# Patient Record
Sex: Female | Born: 1985 | Race: Black or African American | Hispanic: Yes | Marital: Married | Smoking: Never smoker
Health system: Southern US, Community
[De-identification: ages and names within clinical notes are randomized; demographics above are authoritative.]

## PROBLEM LIST (undated history)

## (undated) DIAGNOSIS — B20 Human immunodeficiency virus [HIV] disease: Secondary | ICD-10-CM

## (undated) DIAGNOSIS — I10 Essential (primary) hypertension: Secondary | ICD-10-CM

## (undated) DIAGNOSIS — R112 Nausea with vomiting, unspecified: Secondary | ICD-10-CM

## (undated) DIAGNOSIS — R590 Localized enlarged lymph nodes: Secondary | ICD-10-CM

## (undated) DIAGNOSIS — S8990XA Unspecified injury of unspecified lower leg, initial encounter: Secondary | ICD-10-CM

## (undated) DIAGNOSIS — E119 Type 2 diabetes mellitus without complications: Secondary | ICD-10-CM

## (undated) DIAGNOSIS — Z21 Asymptomatic human immunodeficiency virus [HIV] infection status: Secondary | ICD-10-CM

## (undated) DIAGNOSIS — Z9889 Other specified postprocedural states: Secondary | ICD-10-CM

## (undated) HISTORY — PX: TUBAL LIGATION: SHX77

## (undated) HISTORY — DX: Asymptomatic human immunodeficiency virus (hiv) infection status: Z21

## (undated) HISTORY — DX: Human immunodeficiency virus (HIV) disease: B20

## (undated) HISTORY — DX: Unspecified injury of unspecified lower leg, initial encounter: S89.90XA

## (undated) HISTORY — DX: Localized enlarged lymph nodes: R59.0

---
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
## 2003-01-04 ENCOUNTER — Encounter: Payer: Self-pay | Admitting: Obstetrics and Gynecology

## 2003-01-04 ENCOUNTER — Encounter: Payer: Self-pay | Admitting: Internal Medicine

## 2003-01-04 ENCOUNTER — Inpatient Hospital Stay (HOSPITAL_COMMUNITY): Admission: AD | Admit: 2003-01-04 | Discharge: 2003-01-04 | Payer: Self-pay | Admitting: Obstetrics and Gynecology

## 2003-01-04 ENCOUNTER — Encounter: Payer: Self-pay | Admitting: Infectious Diseases

## 2003-01-04 ENCOUNTER — Encounter (INDEPENDENT_AMBULATORY_CARE_PROVIDER_SITE_OTHER): Payer: Self-pay | Admitting: *Deleted

## 2003-01-04 LAB — CONVERTED CEMR LAB: CD4 T Cell Abs: 290

## 2003-01-06 ENCOUNTER — Encounter: Admission: RE | Admit: 2003-01-06 | Discharge: 2003-01-06 | Payer: Self-pay | Admitting: *Deleted

## 2003-01-20 ENCOUNTER — Encounter: Admission: RE | Admit: 2003-01-20 | Discharge: 2003-01-20 | Payer: Self-pay | Admitting: *Deleted

## 2003-01-28 ENCOUNTER — Encounter: Admission: RE | Admit: 2003-01-28 | Discharge: 2003-01-28 | Payer: Self-pay | Admitting: Family Medicine

## 2003-01-28 ENCOUNTER — Encounter: Admission: RE | Admit: 2003-01-28 | Discharge: 2003-01-28 | Payer: Self-pay | Admitting: Infectious Diseases

## 2003-02-10 ENCOUNTER — Encounter: Admission: RE | Admit: 2003-02-10 | Discharge: 2003-02-10 | Payer: Self-pay | Admitting: Infectious Diseases

## 2003-02-11 ENCOUNTER — Encounter: Admission: RE | Admit: 2003-02-11 | Discharge: 2003-02-11 | Payer: Self-pay | Admitting: Family Medicine

## 2003-02-18 ENCOUNTER — Encounter: Admission: RE | Admit: 2003-02-18 | Discharge: 2003-02-18 | Payer: Self-pay | Admitting: Family Medicine

## 2003-02-22 ENCOUNTER — Encounter: Admission: RE | Admit: 2003-02-22 | Discharge: 2003-02-22 | Payer: Self-pay | Admitting: Infectious Diseases

## 2003-02-22 ENCOUNTER — Encounter: Payer: Self-pay | Admitting: Infectious Diseases

## 2003-03-11 ENCOUNTER — Encounter: Admission: RE | Admit: 2003-03-11 | Discharge: 2003-03-11 | Payer: Self-pay | Admitting: *Deleted

## 2003-03-18 ENCOUNTER — Encounter: Admission: RE | Admit: 2003-03-18 | Discharge: 2003-03-18 | Payer: Self-pay | Admitting: *Deleted

## 2003-03-24 ENCOUNTER — Encounter: Payer: Self-pay | Admitting: Infectious Diseases

## 2003-03-24 ENCOUNTER — Encounter: Admission: RE | Admit: 2003-03-24 | Discharge: 2003-03-24 | Payer: Self-pay | Admitting: Infectious Diseases

## 2003-03-30 ENCOUNTER — Inpatient Hospital Stay (HOSPITAL_COMMUNITY): Admission: AD | Admit: 2003-03-30 | Discharge: 2003-03-30 | Payer: Self-pay | Admitting: *Deleted

## 2003-03-30 ENCOUNTER — Inpatient Hospital Stay (HOSPITAL_COMMUNITY): Admission: AD | Admit: 2003-03-30 | Discharge: 2003-04-02 | Payer: Self-pay | Admitting: Family Medicine

## 2003-06-22 ENCOUNTER — Emergency Department (HOSPITAL_COMMUNITY): Admission: EM | Admit: 2003-06-22 | Discharge: 2003-06-22 | Payer: Self-pay | Admitting: Emergency Medicine

## 2003-09-03 ENCOUNTER — Emergency Department (HOSPITAL_COMMUNITY): Admission: EM | Admit: 2003-09-03 | Discharge: 2003-09-03 | Payer: Self-pay | Admitting: Emergency Medicine

## 2004-03-26 ENCOUNTER — Emergency Department (HOSPITAL_COMMUNITY): Admission: EM | Admit: 2004-03-26 | Discharge: 2004-03-26 | Payer: Self-pay | Admitting: *Deleted

## 2004-04-19 ENCOUNTER — Ambulatory Visit (HOSPITAL_COMMUNITY): Admission: RE | Admit: 2004-04-19 | Discharge: 2004-04-19 | Payer: Self-pay | Admitting: Obstetrics and Gynecology

## 2004-05-04 ENCOUNTER — Encounter: Admission: RE | Admit: 2004-05-04 | Discharge: 2004-05-04 | Payer: Self-pay | Admitting: Family Medicine

## 2004-05-08 ENCOUNTER — Encounter: Admission: RE | Admit: 2004-05-08 | Discharge: 2004-05-08 | Payer: Self-pay | Admitting: Infectious Diseases

## 2004-05-08 ENCOUNTER — Ambulatory Visit (HOSPITAL_COMMUNITY): Admission: RE | Admit: 2004-05-08 | Discharge: 2004-05-08 | Payer: Self-pay | Admitting: Infectious Diseases

## 2004-05-11 ENCOUNTER — Ambulatory Visit (HOSPITAL_COMMUNITY): Admission: RE | Admit: 2004-05-11 | Discharge: 2004-05-11 | Payer: Self-pay | Admitting: *Deleted

## 2004-05-18 ENCOUNTER — Encounter: Admission: RE | Admit: 2004-05-18 | Discharge: 2004-05-18 | Payer: Self-pay | Admitting: Family Medicine

## 2004-06-07 ENCOUNTER — Ambulatory Visit (HOSPITAL_COMMUNITY): Admission: RE | Admit: 2004-06-07 | Discharge: 2004-06-07 | Payer: Self-pay | Admitting: Infectious Diseases

## 2004-06-07 ENCOUNTER — Encounter: Admission: RE | Admit: 2004-06-07 | Discharge: 2004-06-07 | Payer: Self-pay | Admitting: Infectious Diseases

## 2004-06-08 ENCOUNTER — Encounter: Admission: RE | Admit: 2004-06-08 | Discharge: 2004-06-08 | Payer: Self-pay | Admitting: Family Medicine

## 2004-06-22 ENCOUNTER — Ambulatory Visit: Payer: Self-pay | Admitting: Family Medicine

## 2004-07-06 ENCOUNTER — Ambulatory Visit: Payer: Self-pay | Admitting: Family Medicine

## 2004-07-13 ENCOUNTER — Ambulatory Visit: Payer: Self-pay | Admitting: Family Medicine

## 2004-07-27 ENCOUNTER — Ambulatory Visit: Payer: Self-pay | Admitting: Family Medicine

## 2004-08-07 ENCOUNTER — Ambulatory Visit: Payer: Self-pay | Admitting: Infectious Diseases

## 2004-08-07 ENCOUNTER — Ambulatory Visit (HOSPITAL_COMMUNITY): Admission: RE | Admit: 2004-08-07 | Discharge: 2004-08-07 | Payer: Self-pay | Admitting: Infectious Diseases

## 2004-08-10 ENCOUNTER — Ambulatory Visit: Payer: Self-pay | Admitting: Family Medicine

## 2004-08-24 ENCOUNTER — Ambulatory Visit: Payer: Self-pay | Admitting: Family Medicine

## 2004-09-05 ENCOUNTER — Ambulatory Visit: Payer: Self-pay | Admitting: Infectious Diseases

## 2004-09-05 ENCOUNTER — Ambulatory Visit (HOSPITAL_COMMUNITY): Admission: RE | Admit: 2004-09-05 | Discharge: 2004-09-05 | Payer: Self-pay | Admitting: Infectious Diseases

## 2004-09-06 ENCOUNTER — Ambulatory Visit: Payer: Self-pay | Admitting: Obstetrics & Gynecology

## 2004-09-13 ENCOUNTER — Ambulatory Visit (HOSPITAL_COMMUNITY): Admission: RE | Admit: 2004-09-13 | Discharge: 2004-09-13 | Payer: Self-pay | Admitting: Infectious Diseases

## 2004-09-13 ENCOUNTER — Ambulatory Visit: Payer: Self-pay | Admitting: Family Medicine

## 2004-09-14 ENCOUNTER — Ambulatory Visit: Payer: Self-pay | Admitting: Family Medicine

## 2004-09-14 ENCOUNTER — Inpatient Hospital Stay (HOSPITAL_COMMUNITY): Admission: AD | Admit: 2004-09-14 | Discharge: 2004-09-14 | Payer: Self-pay | Admitting: Obstetrics & Gynecology

## 2004-09-20 ENCOUNTER — Ambulatory Visit: Payer: Self-pay | Admitting: *Deleted

## 2004-09-27 ENCOUNTER — Ambulatory Visit: Payer: Self-pay | Admitting: Infectious Diseases

## 2004-09-27 ENCOUNTER — Ambulatory Visit (HOSPITAL_COMMUNITY): Admission: RE | Admit: 2004-09-27 | Discharge: 2004-09-27 | Payer: Self-pay | Admitting: Infectious Diseases

## 2004-09-28 ENCOUNTER — Ambulatory Visit: Payer: Self-pay | Admitting: Family Medicine

## 2004-10-05 ENCOUNTER — Ambulatory Visit: Payer: Self-pay | Admitting: *Deleted

## 2004-10-06 ENCOUNTER — Inpatient Hospital Stay (HOSPITAL_COMMUNITY): Admission: RE | Admit: 2004-10-06 | Discharge: 2004-10-09 | Payer: Self-pay | Admitting: Obstetrics and Gynecology

## 2004-10-06 ENCOUNTER — Encounter (INDEPENDENT_AMBULATORY_CARE_PROVIDER_SITE_OTHER): Payer: Self-pay | Admitting: *Deleted

## 2005-03-20 ENCOUNTER — Emergency Department (HOSPITAL_COMMUNITY): Admission: EM | Admit: 2005-03-20 | Discharge: 2005-03-21 | Payer: Self-pay | Admitting: Emergency Medicine

## 2005-05-11 ENCOUNTER — Ambulatory Visit: Payer: Self-pay | Admitting: Infectious Diseases

## 2005-05-11 ENCOUNTER — Ambulatory Visit (HOSPITAL_COMMUNITY): Admission: RE | Admit: 2005-05-11 | Discharge: 2005-05-11 | Payer: Self-pay | Admitting: Infectious Diseases

## 2005-05-11 ENCOUNTER — Encounter (INDEPENDENT_AMBULATORY_CARE_PROVIDER_SITE_OTHER): Payer: Self-pay | Admitting: *Deleted

## 2005-07-02 ENCOUNTER — Emergency Department (HOSPITAL_COMMUNITY): Admission: EM | Admit: 2005-07-02 | Discharge: 2005-07-02 | Payer: Self-pay | Admitting: Emergency Medicine

## 2005-07-04 ENCOUNTER — Ambulatory Visit (HOSPITAL_COMMUNITY): Admission: RE | Admit: 2005-07-04 | Discharge: 2005-07-04 | Payer: Self-pay | Admitting: Infectious Diseases

## 2005-07-04 ENCOUNTER — Ambulatory Visit: Payer: Self-pay | Admitting: Infectious Diseases

## 2005-10-03 ENCOUNTER — Ambulatory Visit: Payer: Self-pay | Admitting: Infectious Diseases

## 2005-10-03 ENCOUNTER — Encounter (INDEPENDENT_AMBULATORY_CARE_PROVIDER_SITE_OTHER): Payer: Self-pay | Admitting: *Deleted

## 2005-10-03 ENCOUNTER — Ambulatory Visit (HOSPITAL_COMMUNITY): Admission: RE | Admit: 2005-10-03 | Discharge: 2005-10-03 | Payer: Self-pay | Admitting: Infectious Diseases

## 2005-10-03 LAB — CONVERTED CEMR LAB: HIV 1 RNA Quant: 3810 copies/mL

## 2005-11-02 ENCOUNTER — Emergency Department (HOSPITAL_COMMUNITY): Admission: EM | Admit: 2005-11-02 | Discharge: 2005-11-02 | Payer: Self-pay | Admitting: Emergency Medicine

## 2006-03-29 ENCOUNTER — Emergency Department (HOSPITAL_COMMUNITY): Admission: EM | Admit: 2006-03-29 | Discharge: 2006-03-29 | Payer: Self-pay | Admitting: Emergency Medicine

## 2006-04-11 ENCOUNTER — Emergency Department (HOSPITAL_COMMUNITY): Admission: EM | Admit: 2006-04-11 | Discharge: 2006-04-11 | Payer: Self-pay | Admitting: Emergency Medicine

## 2006-04-16 ENCOUNTER — Emergency Department (HOSPITAL_COMMUNITY): Admission: EM | Admit: 2006-04-16 | Discharge: 2006-04-16 | Payer: Self-pay | Admitting: Emergency Medicine

## 2006-07-12 ENCOUNTER — Emergency Department (HOSPITAL_COMMUNITY): Admission: EM | Admit: 2006-07-12 | Discharge: 2006-07-12 | Payer: Self-pay | Admitting: Emergency Medicine

## 2006-08-13 ENCOUNTER — Emergency Department (HOSPITAL_COMMUNITY): Admission: EM | Admit: 2006-08-13 | Discharge: 2006-08-13 | Payer: Self-pay | Admitting: Emergency Medicine

## 2006-10-15 HISTORY — PX: BARTHOLIN GLAND CYST EXCISION: SHX565

## 2006-10-30 DIAGNOSIS — B20 Human immunodeficiency virus [HIV] disease: Secondary | ICD-10-CM | POA: Insufficient documentation

## 2006-11-04 ENCOUNTER — Ambulatory Visit (HOSPITAL_COMMUNITY): Admission: AD | Admit: 2006-11-04 | Discharge: 2006-11-04 | Payer: Self-pay | Admitting: Obstetrics & Gynecology

## 2006-11-04 ENCOUNTER — Ambulatory Visit: Payer: Self-pay | Admitting: Gynecology

## 2006-11-05 ENCOUNTER — Encounter: Admission: RE | Admit: 2006-11-05 | Discharge: 2006-11-05 | Payer: Self-pay | Admitting: Infectious Diseases

## 2006-11-05 ENCOUNTER — Ambulatory Visit: Payer: Self-pay | Admitting: Infectious Diseases

## 2006-11-05 ENCOUNTER — Encounter: Payer: Self-pay | Admitting: Internal Medicine

## 2006-11-05 ENCOUNTER — Encounter (INDEPENDENT_AMBULATORY_CARE_PROVIDER_SITE_OTHER): Payer: Self-pay | Admitting: *Deleted

## 2006-11-05 LAB — CONVERTED CEMR LAB
BUN: 9 mg/dL (ref 6–23)
Basophils Relative: 0 % (ref 0–1)
CD4 Count: 220 microliters
CO2: 25 meq/L (ref 19–32)
Calcium: 8.6 mg/dL (ref 8.4–10.5)
Eosinophils Relative: 3 % (ref 0–5)
Glucose, Bld: 82 mg/dL (ref 70–99)
HCT: 37.2 % (ref 36.0–46.0)
HIV 1 RNA Quant: 1600 copies/mL
Lymphocytes Relative: 31 % (ref 12–46)
Lymphs Abs: 1 10*3/uL (ref 0.7–3.3)
MCV: 89.6 fL (ref 78.0–100.0)
Monocytes Absolute: 0.3 10*3/uL (ref 0.2–0.7)
Monocytes Relative: 9 % (ref 3–11)
Neutro Abs: 1.8 10*3/uL (ref 1.7–7.7)
Potassium: 4.1 meq/L (ref 3.5–5.3)
RBC: 4.15 M/uL (ref 3.87–5.11)
Sodium: 134 meq/L — ABNORMAL LOW (ref 135–145)
Total Protein: 9.9 g/dL — ABNORMAL HIGH (ref 6.0–8.3)
WBC: 3.2 10*3/uL — ABNORMAL LOW (ref 4.0–10.5)

## 2006-11-06 DIAGNOSIS — F329 Major depressive disorder, single episode, unspecified: Secondary | ICD-10-CM

## 2006-11-06 DIAGNOSIS — Z9189 Other specified personal risk factors, not elsewhere classified: Secondary | ICD-10-CM | POA: Insufficient documentation

## 2006-11-06 DIAGNOSIS — Z9119 Patient's noncompliance with other medical treatment and regimen: Secondary | ICD-10-CM

## 2006-11-06 DIAGNOSIS — Z91199 Patient's noncompliance with other medical treatment and regimen due to unspecified reason: Secondary | ICD-10-CM | POA: Insufficient documentation

## 2006-11-07 ENCOUNTER — Encounter: Payer: Self-pay | Admitting: Infectious Diseases

## 2006-11-20 ENCOUNTER — Ambulatory Visit: Payer: Self-pay | Admitting: Infectious Diseases

## 2006-11-20 ENCOUNTER — Encounter: Admission: RE | Admit: 2006-11-20 | Discharge: 2006-11-20 | Payer: Self-pay | Admitting: Infectious Diseases

## 2006-11-20 ENCOUNTER — Encounter (INDEPENDENT_AMBULATORY_CARE_PROVIDER_SITE_OTHER): Payer: Self-pay | Admitting: *Deleted

## 2006-11-20 LAB — CONVERTED CEMR LAB
CD4 Count: 320 microliters
HIV 1 RNA Quant: 49 copies/mL

## 2006-11-28 ENCOUNTER — Ambulatory Visit: Payer: Self-pay | Admitting: Obstetrics and Gynecology

## 2006-12-09 ENCOUNTER — Encounter (INDEPENDENT_AMBULATORY_CARE_PROVIDER_SITE_OTHER): Payer: Self-pay | Admitting: *Deleted

## 2006-12-09 LAB — CONVERTED CEMR LAB: Pap Smear: NORMAL

## 2006-12-22 ENCOUNTER — Encounter (INDEPENDENT_AMBULATORY_CARE_PROVIDER_SITE_OTHER): Payer: Self-pay | Admitting: *Deleted

## 2006-12-26 ENCOUNTER — Telehealth (INDEPENDENT_AMBULATORY_CARE_PROVIDER_SITE_OTHER): Payer: Self-pay | Admitting: Infectious Diseases

## 2006-12-30 ENCOUNTER — Telehealth (INDEPENDENT_AMBULATORY_CARE_PROVIDER_SITE_OTHER): Payer: Self-pay | Admitting: Infectious Diseases

## 2007-02-07 ENCOUNTER — Emergency Department (HOSPITAL_COMMUNITY): Admission: EM | Admit: 2007-02-07 | Discharge: 2007-02-07 | Payer: Self-pay | Admitting: Family Medicine

## 2007-05-07 ENCOUNTER — Encounter (INDEPENDENT_AMBULATORY_CARE_PROVIDER_SITE_OTHER): Payer: Self-pay | Admitting: *Deleted

## 2007-08-27 ENCOUNTER — Encounter (INDEPENDENT_AMBULATORY_CARE_PROVIDER_SITE_OTHER): Payer: Self-pay | Admitting: *Deleted

## 2007-12-19 ENCOUNTER — Inpatient Hospital Stay (HOSPITAL_COMMUNITY): Admission: AD | Admit: 2007-12-19 | Discharge: 2007-12-19 | Payer: Self-pay | Admitting: Family Medicine

## 2007-12-19 ENCOUNTER — Telehealth: Payer: Self-pay | Admitting: Infectious Diseases

## 2008-01-20 ENCOUNTER — Encounter: Payer: Self-pay | Admitting: Infectious Diseases

## 2008-02-11 ENCOUNTER — Emergency Department (HOSPITAL_COMMUNITY): Admission: EM | Admit: 2008-02-11 | Discharge: 2008-02-11 | Payer: Self-pay | Admitting: Emergency Medicine

## 2008-02-15 ENCOUNTER — Emergency Department (HOSPITAL_COMMUNITY): Admission: EM | Admit: 2008-02-15 | Discharge: 2008-02-15 | Payer: Self-pay | Admitting: Emergency Medicine

## 2008-04-21 ENCOUNTER — Emergency Department (HOSPITAL_COMMUNITY): Admission: EM | Admit: 2008-04-21 | Discharge: 2008-04-21 | Payer: Self-pay | Admitting: Emergency Medicine

## 2008-04-22 ENCOUNTER — Emergency Department (HOSPITAL_COMMUNITY): Admission: EM | Admit: 2008-04-22 | Discharge: 2008-04-23 | Payer: Self-pay | Admitting: Emergency Medicine

## 2008-11-24 ENCOUNTER — Encounter: Payer: Self-pay | Admitting: Infectious Diseases

## 2008-11-29 ENCOUNTER — Emergency Department (HOSPITAL_COMMUNITY): Admission: EM | Admit: 2008-11-29 | Discharge: 2008-11-29 | Payer: Self-pay | Admitting: Emergency Medicine

## 2008-12-30 ENCOUNTER — Emergency Department (HOSPITAL_COMMUNITY): Admission: EM | Admit: 2008-12-30 | Discharge: 2008-12-30 | Payer: Self-pay | Admitting: Emergency Medicine

## 2009-01-07 ENCOUNTER — Ambulatory Visit: Payer: Self-pay | Admitting: Internal Medicine

## 2009-01-07 ENCOUNTER — Encounter: Payer: Self-pay | Admitting: Infectious Diseases

## 2009-01-07 ENCOUNTER — Encounter: Payer: Self-pay | Admitting: Internal Medicine

## 2009-01-07 LAB — CONVERTED CEMR LAB
HIV 1 RNA Quant: 10500 copies/mL — ABNORMAL HIGH (ref <48)
HIV-1 RNA Quant, Log: 4.02 — ABNORMAL HIGH (ref <1.68)

## 2009-01-10 LAB — CONVERTED CEMR LAB
AST: 21 units/L (ref 0–37)
BUN: 9 mg/dL (ref 6–23)
Basophils Absolute: 0 10*3/uL (ref 0.0–0.1)
Basophils Relative: 0 % (ref 0–1)
CO2: 20 meq/L (ref 19–32)
Chloride: 103 meq/L (ref 96–112)
Creatinine, Ser: 0.65 mg/dL (ref 0.40–1.20)
GFR calc Af Amer: >60 mL/min (ref >60)
GFR calc non Af Amer: >60 mL/min (ref >60)
Glucose, Bld: 93 mg/dL (ref 70–99)
Lymphocytes Relative: 39 % (ref 12–46)
Lymphs Abs: 1 10*3/uL (ref 0.7–4.0)
MCHC: 35.1 g/dL (ref 30.0–36.0)
Monocytes Absolute: 0.2 10*3/uL (ref 0.1–1.0)
Monocytes Relative: 9 % (ref 3–12)
Neutro Abs: 1.3 10*3/uL — ABNORMAL LOW (ref 1.7–7.7)
Platelets: 245 10*3/uL (ref 150–400)
RDW: 14.9 % (ref 11.5–15.5)
Sodium: 131 meq/L — ABNORMAL LOW (ref 135–145)
WBC: 2.5 10*3/uL — ABNORMAL LOW (ref 4.0–10.5)

## 2009-01-12 ENCOUNTER — Encounter: Payer: Self-pay | Admitting: Infectious Diseases

## 2009-01-12 ENCOUNTER — Telehealth (INDEPENDENT_AMBULATORY_CARE_PROVIDER_SITE_OTHER): Payer: Self-pay | Admitting: Licensed Clinical Social Worker

## 2009-01-26 ENCOUNTER — Encounter (INDEPENDENT_AMBULATORY_CARE_PROVIDER_SITE_OTHER): Payer: Self-pay | Admitting: *Deleted

## 2009-01-26 ENCOUNTER — Ambulatory Visit: Payer: Self-pay | Admitting: Internal Medicine

## 2009-01-26 DIAGNOSIS — Z862 Personal history of diseases of the blood and blood-forming organs and certain disorders involving the immune mechanism: Secondary | ICD-10-CM

## 2009-01-26 DIAGNOSIS — Z8639 Personal history of other endocrine, nutritional and metabolic disease: Secondary | ICD-10-CM | POA: Insufficient documentation

## 2009-01-26 LAB — CONVERTED CEMR LAB
Chloride: 101 meq/L (ref 96–112)
GFR calc Af Amer: >60 mL/min (ref >60)
Potassium: 3.8 meq/L (ref 3.5–5.3)

## 2009-02-02 ENCOUNTER — Telehealth (INDEPENDENT_AMBULATORY_CARE_PROVIDER_SITE_OTHER): Payer: Self-pay | Admitting: *Deleted

## 2009-02-02 ENCOUNTER — Ambulatory Visit: Payer: Self-pay | Admitting: Internal Medicine

## 2009-02-02 DIAGNOSIS — K6289 Other specified diseases of anus and rectum: Secondary | ICD-10-CM

## 2009-02-03 ENCOUNTER — Telehealth (INDEPENDENT_AMBULATORY_CARE_PROVIDER_SITE_OTHER): Payer: Self-pay | Admitting: *Deleted

## 2009-02-03 ENCOUNTER — Telehealth: Payer: Self-pay | Admitting: Internal Medicine

## 2009-02-04 ENCOUNTER — Telehealth (INDEPENDENT_AMBULATORY_CARE_PROVIDER_SITE_OTHER): Payer: Self-pay | Admitting: Licensed Clinical Social Worker

## 2009-03-11 ENCOUNTER — Ambulatory Visit: Payer: Self-pay | Admitting: Internal Medicine

## 2009-03-11 LAB — CONVERTED CEMR LAB
AST: 25 units/L (ref 0–37)
BUN: 10 mg/dL (ref 6–23)
Calcium: 8.1 mg/dL — ABNORMAL LOW (ref 8.4–10.5)
Chloride: 105 meq/L (ref 96–112)
Eosinophils Absolute: 0 10*3/uL (ref 0.0–0.7)
GFR calc non Af Amer: >60 mL/min (ref >60)
Glucose, Bld: 90 mg/dL (ref 70–99)
HCT: 34.7 % — ABNORMAL LOW (ref 36.0–46.0)
Hemoglobin: 11.5 g/dL — ABNORMAL LOW (ref 12.0–15.0)
Lymphocytes Relative: 42 % (ref 12–46)
MCHC: 33.1 g/dL (ref 30.0–36.0)
MCV: 84.2 fL (ref 78.0–100.0)
Monocytes Absolute: 0.1 10*3/uL (ref 0.1–1.0)
Neutro Abs: 1.1 10*3/uL — ABNORMAL LOW (ref 1.7–7.7)
Platelets: 240 10*3/uL (ref 150–400)
Total Bilirubin: 0.4 mg/dL (ref 0.3–1.2)
Total Protein: 10 g/dL — ABNORMAL HIGH (ref 6.0–8.3)

## 2009-04-08 ENCOUNTER — Emergency Department (HOSPITAL_COMMUNITY): Admission: EM | Admit: 2009-04-08 | Discharge: 2009-04-08 | Payer: Self-pay | Admitting: Emergency Medicine

## 2009-04-27 ENCOUNTER — Ambulatory Visit: Payer: Self-pay | Admitting: Internal Medicine

## 2009-05-02 ENCOUNTER — Encounter (INDEPENDENT_AMBULATORY_CARE_PROVIDER_SITE_OTHER): Payer: Self-pay | Admitting: *Deleted

## 2009-08-29 ENCOUNTER — Emergency Department (HOSPITAL_COMMUNITY): Admission: EM | Admit: 2009-08-29 | Discharge: 2009-08-29 | Payer: Self-pay | Admitting: Emergency Medicine

## 2009-08-31 ENCOUNTER — Emergency Department (HOSPITAL_COMMUNITY): Admission: EM | Admit: 2009-08-31 | Discharge: 2009-08-31 | Payer: Self-pay | Admitting: Emergency Medicine

## 2009-09-05 ENCOUNTER — Encounter: Payer: Self-pay | Admitting: Infectious Diseases

## 2009-09-06 ENCOUNTER — Ambulatory Visit: Payer: Self-pay | Admitting: Internal Medicine

## 2009-09-22 ENCOUNTER — Encounter: Payer: Self-pay | Admitting: Infectious Diseases

## 2009-10-15 HISTORY — PX: SKIN GRAFT: SHX250

## 2009-10-20 ENCOUNTER — Ambulatory Visit: Payer: Self-pay | Admitting: Internal Medicine

## 2009-10-20 LAB — CONVERTED CEMR LAB
Albumin: 3 g/dL — ABNORMAL LOW (ref 3.5–5.2)
Alkaline Phosphatase: 56 units/L (ref 39–117)
BUN: 9 mg/dL (ref 6–23)
Basophils Absolute: 0 10*3/uL (ref 0.0–0.1)
CO2: 19 meq/L (ref 19–32)
Creatinine, Ser: 0.65 mg/dL (ref 0.40–1.20)
Glucose, Bld: 91 mg/dL (ref 70–99)
HCT: 35.2 % — ABNORMAL LOW (ref 36.0–46.0)
Hemoglobin: 12.3 g/dL (ref 12.0–15.0)
MCHC: 34.9 g/dL (ref 30.0–36.0)
Monocytes Absolute: 0.1 10*3/uL (ref 0.1–1.0)
Monocytes Relative: 5 % (ref 3–12)
Neutro Abs: 1.2 10*3/uL — ABNORMAL LOW (ref 1.7–7.7)
Platelets: 271 10*3/uL (ref 150–400)
Potassium: 4 meq/L (ref 3.5–5.3)
RDW: 14.4 % (ref 11.5–15.5)
Total Bilirubin: 0.5 mg/dL (ref 0.3–1.2)
Total Protein: 10.6 g/dL — ABNORMAL HIGH (ref 6.0–8.3)

## 2009-10-30 ENCOUNTER — Ambulatory Visit: Payer: Self-pay | Admitting: Internal Medicine

## 2009-10-30 ENCOUNTER — Inpatient Hospital Stay (HOSPITAL_COMMUNITY): Admission: EM | Admit: 2009-10-30 | Discharge: 2009-11-01 | Payer: Self-pay | Admitting: Emergency Medicine

## 2009-10-30 ENCOUNTER — Encounter: Payer: Self-pay | Admitting: Internal Medicine

## 2009-11-01 ENCOUNTER — Encounter: Payer: Self-pay | Admitting: Internal Medicine

## 2009-11-04 ENCOUNTER — Ambulatory Visit: Payer: Self-pay | Admitting: Internal Medicine

## 2009-11-04 DIAGNOSIS — R1110 Vomiting, unspecified: Secondary | ICD-10-CM

## 2009-11-14 ENCOUNTER — Ambulatory Visit: Payer: Self-pay | Admitting: Internal Medicine

## 2009-11-14 ENCOUNTER — Other Ambulatory Visit: Admission: RE | Admit: 2009-11-14 | Discharge: 2009-11-14 | Payer: Self-pay | Admitting: Internal Medicine

## 2009-11-14 LAB — CONVERTED CEMR LAB
Albumin: 3.3 g/dL — ABNORMAL LOW (ref 3.5–5.2)
Alkaline Phosphatase: 65 units/L (ref 39–117)
Pap Smear: NEGATIVE
Total Protein: 10.4 g/dL — ABNORMAL HIGH (ref 6.0–8.3)

## 2009-11-15 ENCOUNTER — Ambulatory Visit (HOSPITAL_COMMUNITY): Admission: RE | Admit: 2009-11-15 | Discharge: 2009-11-15 | Payer: Self-pay | Admitting: Internal Medicine

## 2009-11-15 ENCOUNTER — Ambulatory Visit: Payer: Self-pay | Admitting: Internal Medicine

## 2009-11-15 DIAGNOSIS — T148XXA Other injury of unspecified body region, initial encounter: Secondary | ICD-10-CM | POA: Insufficient documentation

## 2009-11-16 ENCOUNTER — Telehealth: Payer: Self-pay | Admitting: *Deleted

## 2009-11-30 ENCOUNTER — Encounter (INDEPENDENT_AMBULATORY_CARE_PROVIDER_SITE_OTHER): Payer: Self-pay | Admitting: Licensed Clinical Social Worker

## 2009-11-30 DIAGNOSIS — L02419 Cutaneous abscess of limb, unspecified: Secondary | ICD-10-CM

## 2009-11-30 DIAGNOSIS — M79609 Pain in unspecified limb: Secondary | ICD-10-CM

## 2009-11-30 DIAGNOSIS — L03119 Cellulitis of unspecified part of limb: Secondary | ICD-10-CM | POA: Insufficient documentation

## 2009-12-01 ENCOUNTER — Ambulatory Visit (HOSPITAL_COMMUNITY): Admission: RE | Admit: 2009-12-01 | Discharge: 2009-12-01 | Payer: Self-pay | Admitting: Internal Medicine

## 2009-12-03 ENCOUNTER — Encounter: Payer: Self-pay | Admitting: Internal Medicine

## 2009-12-09 ENCOUNTER — Emergency Department (HOSPITAL_COMMUNITY): Admission: EM | Admit: 2009-12-09 | Discharge: 2009-12-09 | Payer: Self-pay | Admitting: Family Medicine

## 2009-12-09 ENCOUNTER — Emergency Department (HOSPITAL_COMMUNITY): Admission: EM | Admit: 2009-12-09 | Discharge: 2009-12-10 | Payer: Self-pay | Admitting: Emergency Medicine

## 2009-12-10 ENCOUNTER — Telehealth: Payer: Self-pay | Admitting: Internal Medicine

## 2009-12-14 ENCOUNTER — Ambulatory Visit: Payer: Self-pay | Admitting: Internal Medicine

## 2009-12-14 LAB — CONVERTED CEMR LAB: HIV 1 RNA Quant: 3750 copies/mL — ABNORMAL HIGH (ref <48)

## 2009-12-15 ENCOUNTER — Encounter: Payer: Self-pay | Admitting: Internal Medicine

## 2009-12-15 LAB — CONVERTED CEMR LAB
Albumin: 3.5 g/dL (ref 3.5–5.2)
BUN: 9 mg/dL (ref 6–23)
Basophils Absolute: 0 10*3/uL (ref 0.0–0.1)
Chloride: 102 meq/L (ref 96–112)
MCHC: 32.1 g/dL (ref 30.0–36.0)
MCV: 86.4 fL (ref >=78.0)
Monocytes Absolute: 0.2 10*3/uL (ref 0.1–1.0)
Monocytes Relative: 9 % (ref 3–12)
Neutrophils Relative %: 54 % (ref 43–77)
RDW: 14.5 % (ref 11.5–15.5)
Sodium: 131 meq/L — ABNORMAL LOW (ref 135–145)
Total Bilirubin: 0.3 mg/dL (ref 0.3–1.2)
WBC: 2 10*3/uL — ABNORMAL LOW (ref 4.0–10.5)

## 2009-12-19 ENCOUNTER — Encounter (HOSPITAL_BASED_OUTPATIENT_CLINIC_OR_DEPARTMENT_OTHER): Admission: RE | Admit: 2009-12-19 | Discharge: 2010-03-19 | Payer: Self-pay | Admitting: Internal Medicine

## 2009-12-20 ENCOUNTER — Encounter (INDEPENDENT_AMBULATORY_CARE_PROVIDER_SITE_OTHER): Payer: Self-pay | Admitting: *Deleted

## 2009-12-30 ENCOUNTER — Ambulatory Visit: Payer: Self-pay | Admitting: Vascular Surgery

## 2010-01-06 ENCOUNTER — Ambulatory Visit: Payer: Self-pay | Admitting: Internal Medicine

## 2010-01-06 DIAGNOSIS — J209 Acute bronchitis, unspecified: Secondary | ICD-10-CM | POA: Insufficient documentation

## 2010-01-06 LAB — CONVERTED CEMR LAB
HIV 1 RNA Quant: 9820 copies/mL — ABNORMAL HIGH (ref <48)
HIV-1 RNA Quant, Log: 3.99 — ABNORMAL HIGH (ref <1.68)

## 2010-01-31 ENCOUNTER — Ambulatory Visit: Payer: Self-pay | Admitting: Vascular Surgery

## 2010-01-31 ENCOUNTER — Ambulatory Visit: Admission: RE | Admit: 2010-01-31 | Discharge: 2010-01-31 | Payer: Self-pay | Admitting: Internal Medicine

## 2010-01-31 ENCOUNTER — Encounter (HOSPITAL_BASED_OUTPATIENT_CLINIC_OR_DEPARTMENT_OTHER): Payer: Self-pay | Admitting: Internal Medicine

## 2010-02-01 ENCOUNTER — Ambulatory Visit: Payer: Self-pay | Admitting: Internal Medicine

## 2010-02-13 IMAGING — CR DG TIBIA/FIBULA 2V*L*
4 series · 4 of 4 positions shown · non-contrast
Comparison: None

CLINICAL DATA: [DATE], fracture 12 years ago, now with open wound
laterally, question osteomyelitis

LEFT TIBIA AND FIBULA - 2 VIEW

[t tib/fib ap left (1 of 2)]
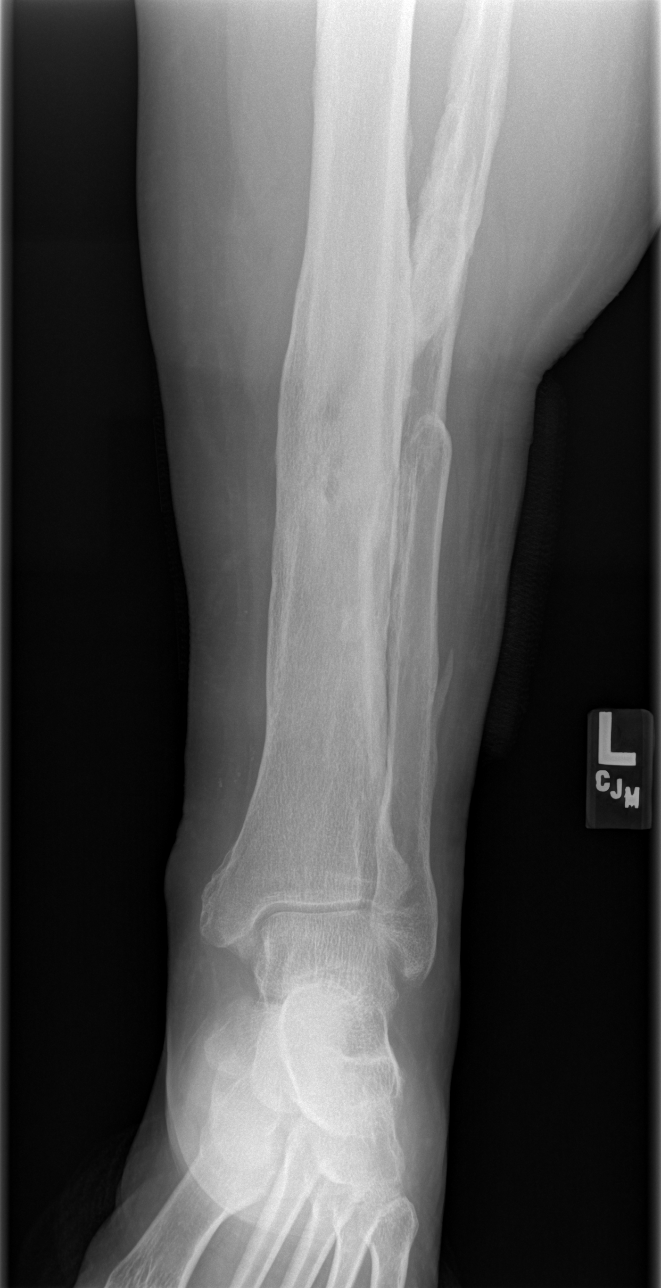

[t tib/fib ap left (2 of 2)]
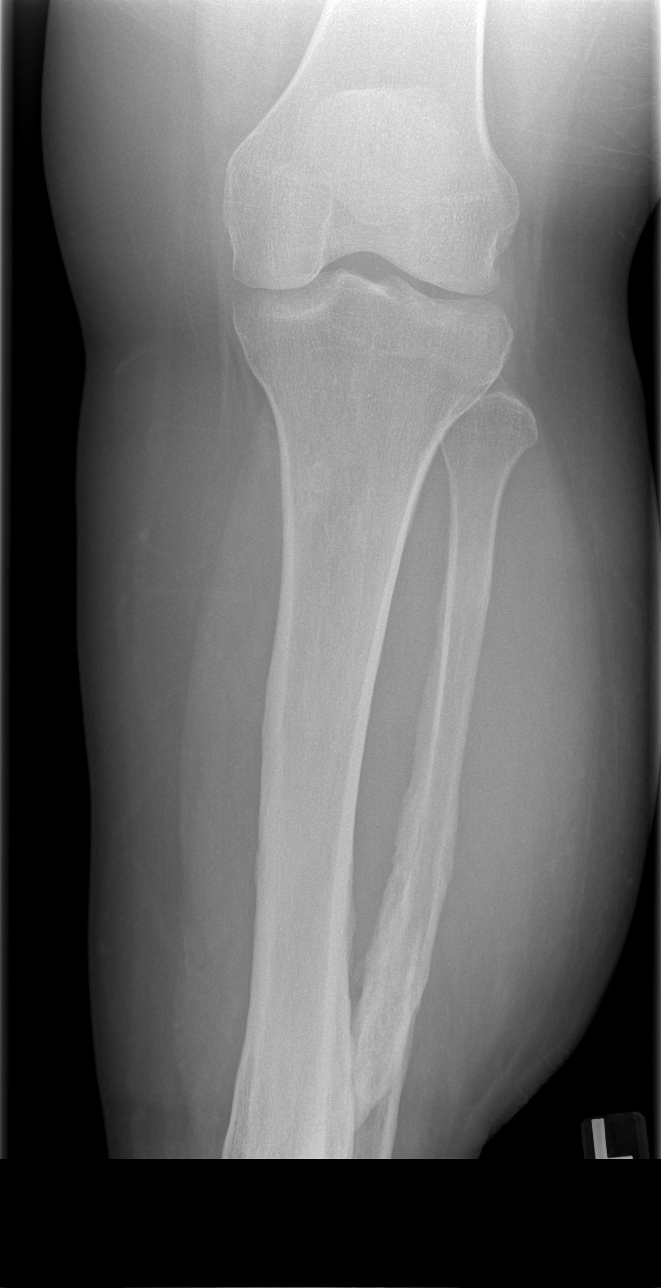

[t tib/fib lat left (1 of 2)]
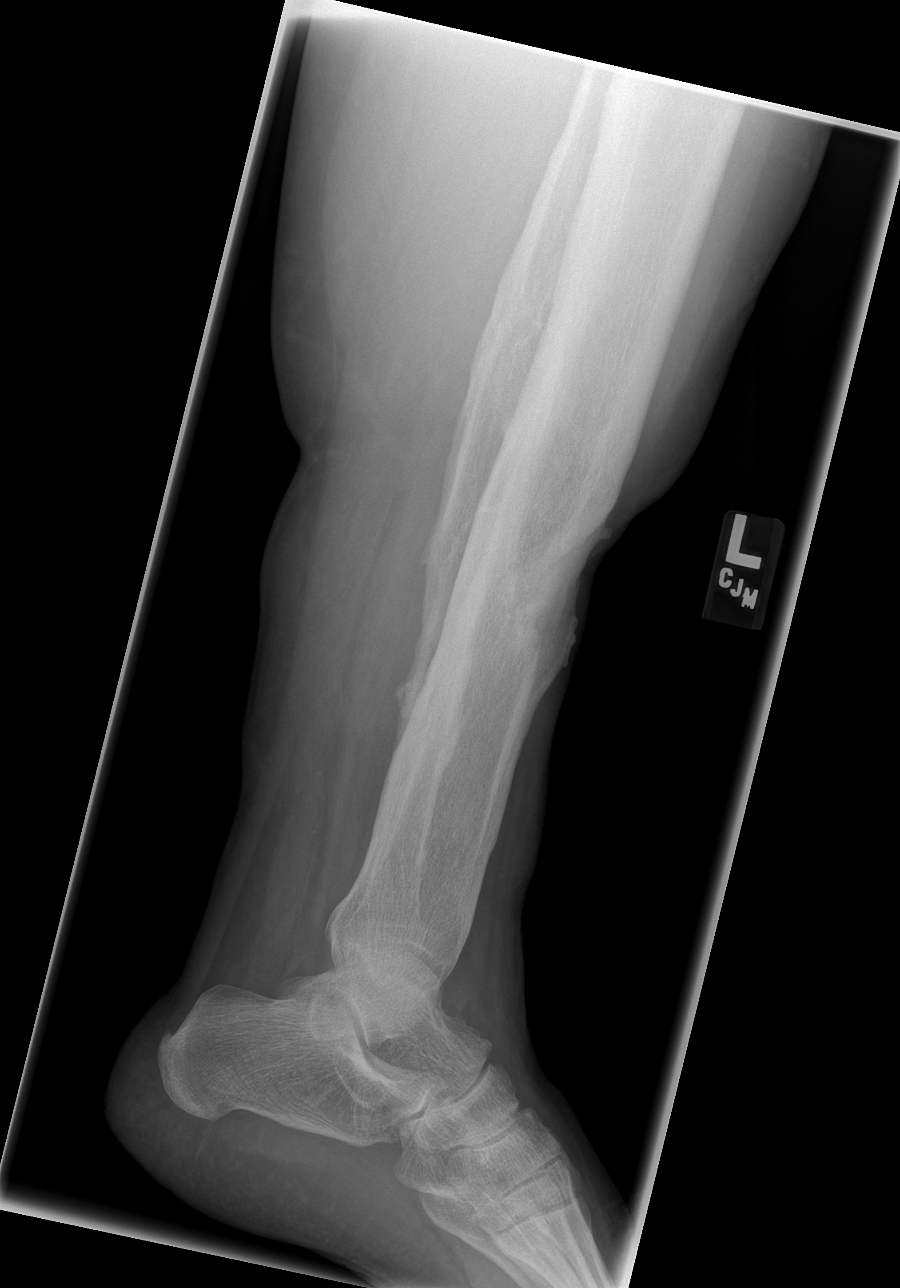

[t tib/fib lat left (2 of 2)]
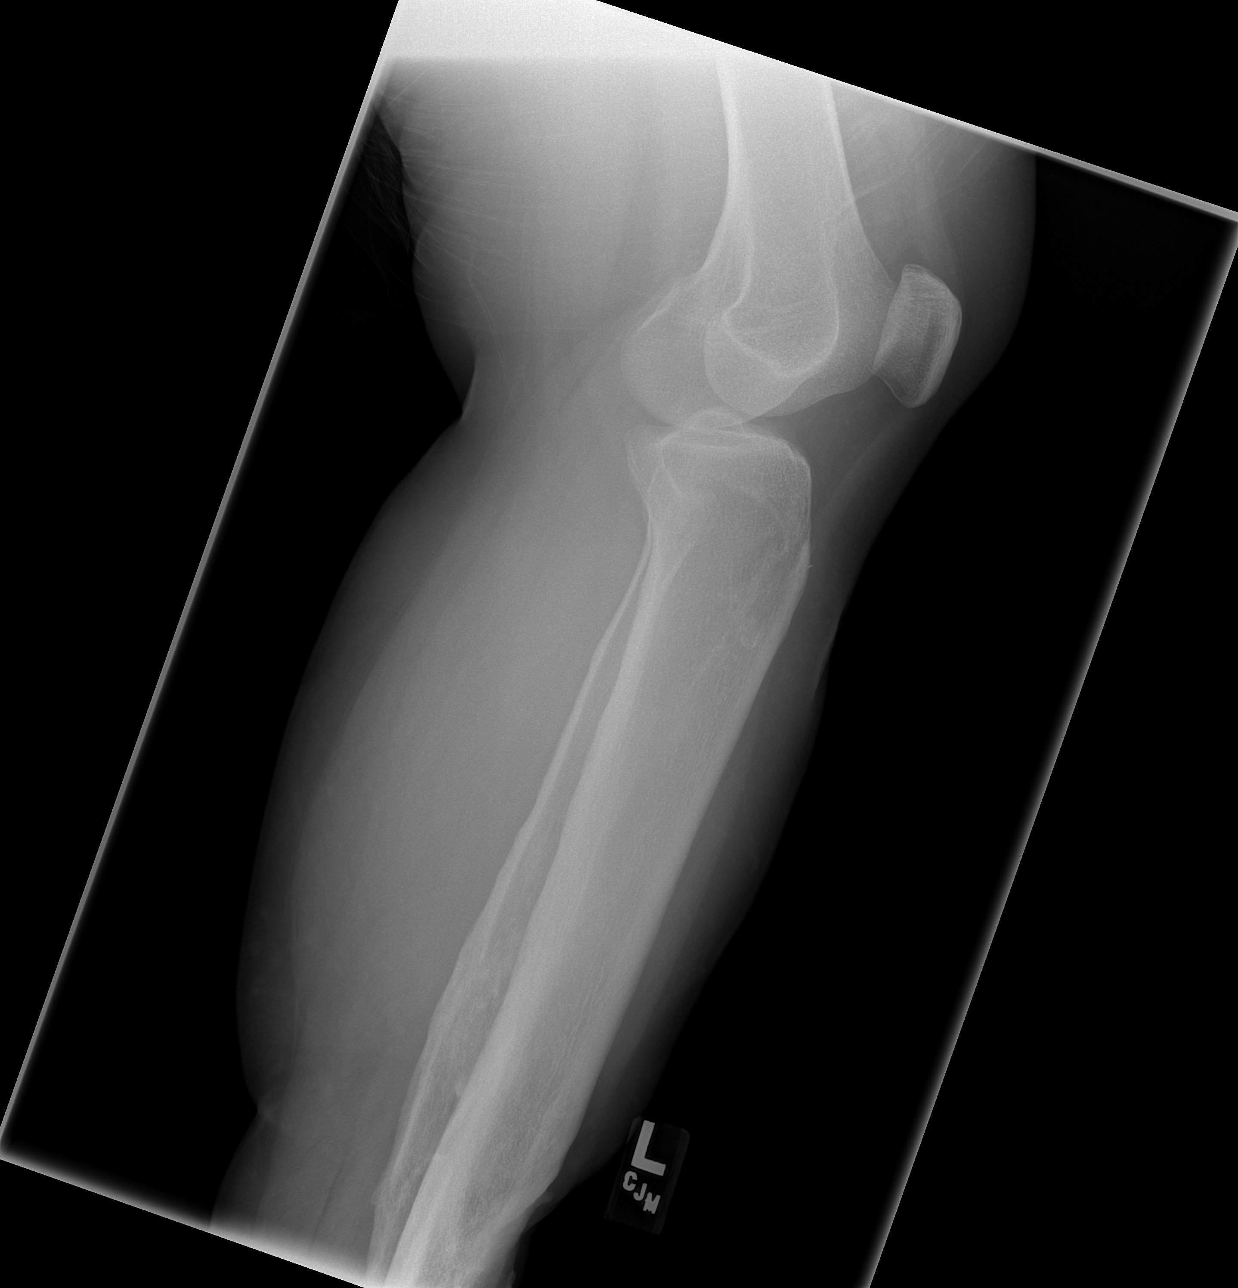

[4 of 4 positions shown; findings below may reference images not displayed]

FINDINGS: Diffuse bony demineralization.
Ankle and knee joint alignments normal.
Deformities of the middle thirds of the left fibula and tibia
identified.
Bony deformity and well-formed periosteal bone at anterior mid
tibia.
Mature periosteal new bone identified along the shaft of left
fibula.
No definite acute periosteal reaction or bone destruction seen to
suggest acute osteomyelitis.
Pretibial soft tissue swelling at proximal half of left lower leg.
No acute fracture or dislocation.
IMPRESSION: Bony deformities of left tibial and fibular diaphyses compatible
with old fractures.
No definite acute bony abnormalities identified to suggest acute
osteomyelitis.
If there remains clinical concern for osteomyelitis, or for soft
tissue abscess of the left lower leg, recommend MR imaging with
without contrast.

## 2010-02-15 ENCOUNTER — Ambulatory Visit: Payer: Self-pay | Admitting: Internal Medicine

## 2010-02-15 ENCOUNTER — Telehealth (INDEPENDENT_AMBULATORY_CARE_PROVIDER_SITE_OTHER): Payer: Self-pay | Admitting: *Deleted

## 2010-02-15 DIAGNOSIS — B373 Candidiasis of vulva and vagina: Secondary | ICD-10-CM | POA: Insufficient documentation

## 2010-02-15 DIAGNOSIS — R21 Rash and other nonspecific skin eruption: Secondary | ICD-10-CM | POA: Insufficient documentation

## 2010-02-20 ENCOUNTER — Ambulatory Visit: Payer: Self-pay | Admitting: Internal Medicine

## 2010-02-20 DIAGNOSIS — R300 Dysuria: Secondary | ICD-10-CM | POA: Insufficient documentation

## 2010-02-20 LAB — CONVERTED CEMR LAB
Glucose, Urine, Semiquant: NEGATIVE
pH: 6

## 2010-02-21 ENCOUNTER — Encounter: Payer: Self-pay | Admitting: Internal Medicine

## 2010-02-21 ENCOUNTER — Telehealth: Payer: Self-pay | Admitting: Internal Medicine

## 2010-02-21 LAB — CONVERTED CEMR LAB
Casts: NONE SEEN /lpf
Hemoglobin, Urine: NEGATIVE
Ketones, ur: NEGATIVE mg/dL
Protein, ur: NEGATIVE mg/dL
Urine Glucose: NEGATIVE mg/dL
Urobilinogen, UA: 1 (ref 0.0–1.0)
pH: 6 (ref 5.0–8.0)

## 2010-03-16 ENCOUNTER — Ambulatory Visit: Payer: Self-pay | Admitting: Internal Medicine

## 2010-03-16 LAB — CONVERTED CEMR LAB
ALT: 22 units/L (ref 0–35)
Alkaline Phosphatase: 59 units/L (ref 39–117)
Basophils Absolute: 0 10*3/uL (ref 0.0–0.1)
Basophils Relative: 0 % (ref 0–1)
Calcium: 8.3 mg/dL — ABNORMAL LOW (ref 8.4–10.5)
Chloride: 105 meq/L (ref 96–112)
Creatinine, Ser: 0.73 mg/dL (ref 0.40–1.20)
HIV 1 RNA Quant: 6080 copies/mL — ABNORMAL HIGH (ref <48)
HIV-1 RNA Quant, Log: 3.78 — ABNORMAL HIGH (ref <1.68)
Lymphs Abs: 0.9 10*3/uL (ref 0.7–4.0)
MCV: 83.2 fL (ref 78.0–100.0)
Monocytes Absolute: 0.2 10*3/uL (ref 0.1–1.0)
Monocytes Relative: 9 % (ref 3–12)
Neutrophils Relative %: 55 % (ref 43–77)
Potassium: 3.8 meq/L (ref 3.5–5.3)
RBC: 4.34 M/uL (ref 3.87–5.11)
Sodium: 136 meq/L (ref 135–145)
Total Bilirubin: 0.4 mg/dL (ref 0.3–1.2)
Total Protein: 9 g/dL — ABNORMAL HIGH (ref 6.0–8.3)
WBC: 2.5 10*3/uL — ABNORMAL LOW (ref 4.0–10.5)

## 2010-03-27 ENCOUNTER — Encounter (HOSPITAL_BASED_OUTPATIENT_CLINIC_OR_DEPARTMENT_OTHER): Admission: RE | Admit: 2010-03-27 | Discharge: 2010-06-25 | Payer: Self-pay | Admitting: Internal Medicine

## 2010-04-14 ENCOUNTER — Emergency Department (HOSPITAL_COMMUNITY): Admission: EM | Admit: 2010-04-14 | Discharge: 2010-04-14 | Payer: Self-pay | Admitting: Emergency Medicine

## 2010-04-17 ENCOUNTER — Emergency Department (HOSPITAL_COMMUNITY): Admission: EM | Admit: 2010-04-17 | Discharge: 2010-04-17 | Payer: Self-pay | Admitting: Emergency Medicine

## 2010-04-25 ENCOUNTER — Encounter: Payer: Self-pay | Admitting: Internal Medicine

## 2010-06-26 ENCOUNTER — Encounter (HOSPITAL_BASED_OUTPATIENT_CLINIC_OR_DEPARTMENT_OTHER): Admission: RE | Admit: 2010-06-26 | Discharge: 2010-07-17 | Payer: Self-pay | Admitting: Internal Medicine

## 2010-06-28 ENCOUNTER — Encounter (INDEPENDENT_AMBULATORY_CARE_PROVIDER_SITE_OTHER): Payer: Self-pay | Admitting: *Deleted

## 2010-07-13 ENCOUNTER — Ambulatory Visit (HOSPITAL_BASED_OUTPATIENT_CLINIC_OR_DEPARTMENT_OTHER): Admission: RE | Admit: 2010-07-13 | Discharge: 2010-07-13 | Payer: Self-pay | Admitting: Plastic Surgery

## 2010-07-30 ENCOUNTER — Ambulatory Visit: Payer: Self-pay | Admitting: Nurse Practitioner

## 2010-07-30 ENCOUNTER — Inpatient Hospital Stay (HOSPITAL_COMMUNITY): Admission: AD | Admit: 2010-07-30 | Discharge: 2010-07-30 | Payer: Self-pay | Admitting: Obstetrics & Gynecology

## 2010-09-12 ENCOUNTER — Emergency Department (HOSPITAL_COMMUNITY)
Admission: EM | Admit: 2010-09-12 | Discharge: 2010-09-13 | Payer: Self-pay | Source: Home / Self Care | Admitting: Emergency Medicine

## 2010-10-09 ENCOUNTER — Emergency Department (HOSPITAL_COMMUNITY)
Admission: EM | Admit: 2010-10-09 | Discharge: 2010-10-09 | Payer: Self-pay | Source: Home / Self Care | Admitting: Emergency Medicine

## 2010-10-30 ENCOUNTER — Emergency Department (HOSPITAL_COMMUNITY)
Admission: EM | Admit: 2010-10-30 | Discharge: 2010-10-30 | Payer: Self-pay | Source: Home / Self Care | Admitting: Emergency Medicine

## 2010-11-01 ENCOUNTER — Ambulatory Visit
Admission: RE | Admit: 2010-11-01 | Discharge: 2010-11-01 | Payer: Self-pay | Source: Home / Self Care | Attending: Internal Medicine | Admitting: Internal Medicine

## 2010-11-01 DIAGNOSIS — K119 Disease of salivary gland, unspecified: Secondary | ICD-10-CM | POA: Insufficient documentation

## 2010-11-01 LAB — CBC
HCT: 35.9 % — ABNORMAL LOW (ref 36.0–46.0)
Hemoglobin: 12.2 g/dL (ref 12.0–15.0)
MCH: 28.6 pg (ref 26.0–34.0)
MCHC: 34 g/dL (ref 30.0–36.0)
MCV: 84.1 fL (ref 78.0–100.0)
Platelets: 242 10*3/uL (ref 150–400)
RBC: 4.27 MIL/uL (ref 3.87–5.11)
RDW: 13.6 % (ref 11.5–15.5)
WBC: 3 10*3/uL — ABNORMAL LOW (ref 4.0–10.5)

## 2010-11-01 LAB — POCT I-STAT, CHEM 8
BUN: 11 mg/dL (ref 6–23)
Calcium, Ion: 1.16 mmol/L (ref 1.12–1.32)
Chloride: 101 mEq/L (ref 96–112)
Creatinine, Ser: 0.9 mg/dL (ref 0.4–1.2)
Glucose, Bld: 93 mg/dL (ref 70–99)
HCT: 39 % (ref 36.0–46.0)
Hemoglobin: 13.3 g/dL (ref 12.0–15.0)
Potassium: 3.8 mEq/L (ref 3.5–5.1)
Sodium: 138 mEq/L (ref 135–145)
TCO2: 27 mmol/L (ref 0–100)

## 2010-11-01 LAB — DIFFERENTIAL
Basophils Absolute: 0 10*3/uL (ref 0.0–0.1)
Basophils Relative: 0 % (ref 0–1)
Eosinophils Absolute: 0 10*3/uL (ref 0.0–0.7)
Eosinophils Relative: 1 % (ref 0–5)
Lymphocytes Relative: 29 % (ref 12–46)
Lymphs Abs: 0.9 10*3/uL (ref 0.7–4.0)
Monocytes Absolute: 0.2 10*3/uL (ref 0.1–1.0)
Monocytes Relative: 8 % (ref 3–12)
Neutro Abs: 1.8 10*3/uL (ref 1.7–7.7)
Neutrophils Relative %: 62 % (ref 43–77)

## 2010-11-14 NOTE — Miscellaneous (Signed)
Summary: Hospital D/C  Date of admission: 10/30/09 Date of discharge: 11/01/09  Brief summary: Alison Chambers was painful left preauricular/sunmandibular LAD with concominant tonsilar hypertrophy and diffuse cervical lymphadenopathy.  CT of the head and neck did not reveal evidence of asbcess or airway compromise but did reveal a suspicious infiltrate in the RUL possible consistent with atypical PNA.  Alison Chambers's symptoms reseolved with tylenol/ibuprofen for pain and treatment for CAP.  The etiology behind her symptoms remains unclear but is most c/w viral etiology.  Pending tests include: EBV panel, CMV IgG/IgM, Interferon gold assay, toxo IgG/IgM, and RPR.  These will need to be followed up at her out patient visit.  Additionally, she may need referral for bx to evaluate for lymphoma if her symptoms persist.  Additionally, Alison Chambers expressed feelings of significant depression and admitted that she has not been taking her Atripla 2/2 increased fatigue and feeling unable to care for her children 2/2 the fatigue.  Addionally, she reports increased stress at home; recent death of father, mother with cancer, etc . She is in touch with [facility] and they will continue to work with her regarding her financial issues and stressors.  She is agreeable to therapy with Zoloft and will be sent home with a prescription.  Her symptoms were also discussed with Dr. Harrietta Guardian and he recommended consideration of low dose ritalin to help with symptoms of fatigue associated with Atripla as well as depressive symptoms.  We will leave this decision to Dr. Philipp Deputy. Clinical Lists Changes  Medications: Added new medication of ZOLOFT 50 MG TABS (SERTRALINE HCL) take one pill by mouth every day - Signed Removed medication of FLUCONAZOLE 200 MG TABS (FLUCONAZOLE) 1 tablet by mouth for 14 days Rx of ZOLOFT 50 MG TABS (SERTRALINE HCL) take one pill by mouth every day;  #30 x 3;  Signed;  Entered by: Nelda Bucks DO;  Authorized by: Nelda Bucks DO;  Method used: Print then Give to Patient Observations: Added new observation of INSTRUCTIONS: You have a follow up appointment with Dr. Philipp Deputy on January, 21, 2011. Please continue to take all of your medications every day. You have been given a prescription for Zoloft.  Please take this medicine every day.  It make 1-2 weeks before you start to feel better but the medicine will work if you give it time.  Please take the medicine every day and call the clinic if you experience any side effects. Take 400-600mg  of Ibuprofen (Advil, Motrin) with food every 4-6 hours as needed for relief of pain or comfort of fever. Take 650-1000mg  of Tylenol every 4-6 hours as needed for relief of pain or comfort of fever AVOID taking more than 4000mg   in a 24 hour period. (11/01/2009 11:37)    Prescriptions: ZOLOFT 50 MG TABS (SERTRALINE HCL) take one pill by mouth every day  #30 x 3   Entered and Authorized by:   Nelda Bucks DO   Signed by:   Nelda Bucks DO on 11/01/2009   Method used:   Print then Give to Patient   RxID:   [id number]    Patient Instructions: 1)  You have a follow up appointment with Dr. Philipp Deputy on January, 21, 2011. 2)  Please continue to take all of your medications every day. 3)  You have been given a prescription for Zoloft.  Please take this medicine every day.  It make 1-2 weeks before you start to feel better but the medicine will work if you give  it time.  Please take the medicine every day and call the clinic if you experience any side effects. 4)  Take 400-600mg  of Ibuprofen (Advil, Motrin) with food every 4-6 hours as needed for relief of pain or comfort of fever. 5)  Take 650-1000mg  of Tylenol every 4-6 hours as needed for relief of pain or comfort of fever AVOID taking more than 4000mg   in a 24 hour period.

## 2010-11-14 NOTE — Assessment & Plan Note (Signed)
Summary: F/U/VS   CC:  2 month follow up/check left lower leg-bump came up 2 days ago.  History of Present Illness: (Interpreter utilized) Pt recently hospitalized for lymphadenitis which has resolved.  She was not taking her Atripla because it was making her tired but she re-started it in the hospital an wants to stay on it. She c/o blister on her lower left leg and decreased appetite with vomiting. She noticed the blister 2 days ago.  it is slightly painful.  No fever. She denies abdominal pain, gerd, ro diarrhea. She was started on zoloft while in the hospital.  she has been feeling depressed.  She feels much better on the zoloft.  Preventive Screening-Counseling & Management  Alcohol-Tobacco     Alcohol drinks/day: 0     Smoking Status: never  Caffeine-Diet-Exercise     Caffeine use/day: 0     Diet Comments: patient trying to lose weight     Does Patient Exercise: no  Safety-Violence-Falls     Seat Belt Use: yes   Updated Prior Medication List: ATRIPLA 600-200-300 MG TABS (EFAVIRENZ-EMTRICITAB-TENOFOVIR) Take 1 tablet by mouth at bedtime BACTRIM DS 800-160 MG TABS (SULFAMETHOXAZOLE-TRIMETHOPRIM) Take 1 tablet by mouth once a day ZOLOFT 50 MG TABS (SERTRALINE HCL) take one pill by mouth every day PROMETHAZINE HCL 25 MG TABS (PROMETHAZINE HCL) Take 1 tablet by mouth every 8 hours as needed DOXYCYCLINE HYCLATE 100 MG CAPS (DOXYCYCLINE HYCLATE) Take 1 capsule by mouth two times a day  Current Allergies (reviewed today): ! PCN Review of Systems       The patient complains of anorexia.  The patient denies fever, abdominal pain, and severe indigestion/heartburn.    Vital Signs:  Patient profile:   25 year old female Menstrual status:  regular Height:      66 inches (167.64 cm) Weight:      223.2 pounds (101.45 kg) BMI:     36.16 Temp:     97.8 degrees F (36.56 degrees C) oral Pulse rate:   88 / minute BP sitting:   129 / 83  (left arm)  Vitals Entered By: Baxter Hire) (November 04, 2009 9:58 AM) CC: 2 month follow up/check left lower leg-bump came up 2 days ago Is Patient Diabetic? No Pain Assessment Patient in pain? yes     Location: left lower leg Intensity: 0 Type: sore Onset of pain  leg gets sore at night Nutritional Status BMI of > 30 = obese Nutritional Status Detail appetite is not good per patient  Does patient need assistance? Functional Status Self care Ambulation Normal   Physical Exam  General:  alert, well-developed, well-nourished, and well-hydrated.   Head:  normocephalic and atraumatic.   Mouth:  pharynx pink and moist.  no thrush  Lungs:  normal breath sounds.   Abdomen:  soft, non-tender, and no distention.   Skin:  erythematous area above left leg scar with blister filled with a small amount of clear fluid        Medication Adherence: 11/04/2009   Adherence to medications reviewed with patient. Counseling to provide adequate adherence provided                                Impression & Recommendations:  Problem # 1:  HIV INFECTION (ICD-042) Will repeat labs in 6 weeks on her Atripla.  Pt encouraged not to miss any doses. Diagnostics Reviewed:  CD4: 110 (10/21/2009)  WBC: 2.3 (10/20/2009)   Hgb: 12.3 (10/20/2009)   HCT: 35.2 (10/20/2009)   Platelets: 271 (10/20/2009) HIV genotype: See Comment (01/26/2009)   HIV-1 RNA: 10000 (10/20/2009)   HBSAg: NO (12/09/2006)  Orders: Est. Patient Level III (99213)Future Orders: T-CD4SP ([facility]) (CD4SP) ... 12/16/2009 T-HIV Viral Load [id number]) ... 12/16/2009 T-Comprehensive Metabolic Panel [id number]) ... 12/16/2009 T-CBC w/Diff ([id number]) ... 12/16/2009  Problem # 2:  VOMITING ALONE (ICD-787.03) will treat with some phenergan if persists will refer to GI.  Problem # 3:  DEPRESSION (ICD-311) feeling better on zoloft. Her updated medication list for this problem includes:    Zoloft 50 Mg Tabs (Sertraline hcl) .Marland Kitchen... Take one pill by mouth  every day  Medications Added to Medication List This Visit: 1)  Promethazine Hcl 25 Mg Tabs (Promethazine hcl) .... Take 1 tablet by mouth every 8 hours as needed 2)  Doxycycline Hyclate 100 Mg Caps (Doxycycline hyclate) .... Take 1 capsule by mouth two times a day  Patient Instructions: 1)  Please schedule a follow-up appointment in 8 weeks.  Prescriptions: DOXYCYCLINE HYCLATE 100 MG CAPS (DOXYCYCLINE HYCLATE) Take 1 capsule by mouth two times a day  #20 x 0   Entered and Authorized by:   Yisroel Ramming MD   Signed by:   Yisroel Ramming MD on 11/04/2009   Method used:   Print then Give to Patient   RxID:   [id number] PROMETHAZINE HCL 25 MG TABS (PROMETHAZINE HCL) Take 1 tablet by mouth every 8 hours as needed  #36 x 0   Entered and Authorized by:   Yisroel Ramming MD   Signed by:   Yisroel Ramming MD on 11/04/2009   Method used:   Print then Give to Patient   RxID:   [id number]  Process Orders Check Orders Results:     [facility]: ABN not required for this insurance Tests Sent for requisitioning (November 04, 2009 3:37 PM):     12/16/2009: [facility] -- T-HIV Viral Load [phone] (signed)     12/16/2009: [facility] -- T-Comprehensive Metabolic Panel [[id number]] (signed)     12/16/2009: [facility] -- [facility] w/Diff [[id number]] (signed)

## 2010-11-14 NOTE — Consult Note (Signed)
Summary: [facility]   Imported By: Florinda Marker 05/03/2010 11:15:32  _____________________________________________________________________  External Attachment:    Type:   Image     Comment:   External Document

## 2010-11-14 NOTE — Miscellaneous (Signed)
Summary: clinical update/ryan white [facility] appr til 01/13/11  Clinical Lists Changes  Observations: Added new observation of AIDSDAP: Yes 2011 (12/20/2009 8:53)

## 2010-11-14 NOTE — Progress Notes (Signed)
Summary: Hives/ UTI?  Phone Note Call from Patient Call back at [facility] Phone [phone]   Caller: Patient Details for Reason: Hives/ hurt when go to bathroom Summary of Call: Patient called.  I had to call her back via interpreter.  She said she took the medication that was sent to her via [facility] and developed hives.  She went to a doctor yesterday and they gave her medication for the allergy (benadryl and told her to take that).  When she took the medication; it hurt her to go to the bathroom and she said it hurt alot.  She told me that she is coming in today to be seen, but wanted to let someone know that she don't feel good. Initial call taken by: Paulo Fruit  BS,CPht II,MPH,  Feb 15, 2010 10:03 AM

## 2010-11-14 NOTE — Miscellaneous (Signed)
Summary: RW Update  Clinical Lists Changes  Observations: Added new observation of YEARAIDSPOS: 2010  (12/03/2009 9:37)

## 2010-11-14 NOTE — Progress Notes (Signed)
Summary: RX for Cipro  Phone Note Call from Patient   Caller: Patient Call For: Yisroel Ramming MD Reason for Call: Refill Medication Details for Reason: Needs RX for UTI Summary of Call: Pt. needs RX for Cipro 500mg . two times a day X 7 days Initial call taken by: Wendall Mola CMA Duncan Dull),  Feb 21, 2010 2:35 PM

## 2010-11-14 NOTE — Miscellaneous (Signed)
Summary: Orders Update  Clinical Lists Changes  Orders: Added new Test order of T-Urinalysis ([id number]) - Signed Added new Test order of T-Culture, Urine ([id number]) - Signed     Process Orders Check Orders Results:     [facility]: ABN not required for this insurance Tests Sent for requisitioning (Feb 20, 2010 4:32 PM):     02/20/2010: [facility] -- T-Urinalysis [[id number]] (signed)     02/20/2010: [facility] -- T-Culture, Urine [[id number]] (signed)

## 2010-11-14 NOTE — Miscellaneous (Signed)
Summary: MRI ordered   Clinical Lists Changes  Problems: Added new problem of LEG PAIN, LEFT (ICD-729.5) Added new problem of CELLULITIS AND ABSCESS OF LEG EXCEPT FOOT (ICD-682.6) Orders: Added new Test order of MRI with & without Contrast (MRI w&w/o Contrast) - Signed Added new Test order of MRI with & without Contrast (MRI w&w/o Contrast) - Signed

## 2010-11-14 NOTE — Progress Notes (Signed)
  Phone Note From Other Clinic   Summary of Call: I was called by ED PA, patient was seen for abdominal pain found to have mild diverticulitis. They want patient to be seen next monday.  Initial call taken by: Hartley Barefoot MD,  December 10, 2009 6:42 AM

## 2010-11-14 NOTE — Assessment & Plan Note (Signed)
Summary: Office Visit (Infectious Disease)    CC:  lower left leg ulcer and painful.  History of Present Illness: Pt states that she has a worsening blister on  left lowr leg where she has a deformity from an old fracture. She states is tis draining fluid over night and has become more painful.  She took all of the doxycycline.  She states that she was febrile yesterday but does not know her temperature.  no chills.  no fever today.  Preventive Screening-Counseling & Management  Alcohol-Tobacco     Alcohol drinks/day: 0     Smoking Status: never  Caffeine-Diet-Exercise     Caffeine use/day: 0     Diet Comments: patient trying to lose weight     Does Patient Exercise: no     Type of exercise: video exercise     Exercise (avg: min/session): >60     Times/week: 3  Safety-Violence-Falls     Seat Belt Use: yes   Updated Prior Medication List: ATRIPLA 600-200-300 MG TABS (EFAVIRENZ-EMTRICITAB-TENOFOVIR) Take 1 tablet by mouth at bedtime BACTRIM DS 800-160 MG TABS (SULFAMETHOXAZOLE-TRIMETHOPRIM) Take 1 tablet by mouth once a day ZOLOFT 50 MG TABS (SERTRALINE HCL) take one pill by mouth every day PROMETHAZINE HCL 25 MG TABS (PROMETHAZINE HCL) Take 1 tablet by mouth every 8 hours as needed CIPROFLOXACIN HCL 500 MG TABS (CIPROFLOXACIN HCL) Take 1 tablet by mouth two times a day  Current Allergies (reviewed today): ! PCN Past History:  Past Medical History: Last updated: 10/30/2006 HIV infection-dx. 12/2002 Intrauterine pregnancy Tubal ligation Depression  Review of Systems       The patient complains of fever.  The patient denies anorexia and weight loss.    Vital Signs:  Patient profile:   25 year old female Menstrual status:  regular Height:      65.5 inches (166.37 cm) Weight:      227.1 pounds (103.23 kg) BMI:     37.35 Temp:     97.2 degrees F (36.22 degrees C) oral Pulse rate:   80 / minute BP sitting:   115 / 80  (right arm)  Vitals Entered By: Baxter Hire) (November 15, 2009 3:59 PM) CC: lower left leg ulcer, painful Is Patient Diabetic? No Pain Assessment Patient in pain? yes     Location: lower left leg Type: soreness Nutritional Status BMI of > 30 = obese Nutritional Status Detail appetite is okay  Does patient need assistance? Functional Status Self care Ambulation Normal   Physical Exam  General:  alert, well-developed, well-nourished, and well-hydrated.   Head:  normocephalic and no abnormalities observed.   Extremities:  deformity of left lower leg clear blister with some puss around the edges   Impression & Recommendations:  Problem # 1:  BLISTER (ICD-919.2) will obtain a culture and obtain an x-ray to r/u osteo will refer to derm treat with ciprofloxacin Orders: Est. Patient Level III (16109) T-Culture, Wound ([id number]) Diagnostic X-Ray/Fluoroscopy (Diagnostic X-Ray/Flu) Dermatology Referral (Derma)  Medications Added to Medication List This Visit: 1)  Ciprofloxacin Hcl 500 Mg Tabs (Ciprofloxacin hcl) .... Take 1 tablet by mouth two times a day Prescriptions: CIPROFLOXACIN HCL 500 MG TABS (CIPROFLOXACIN HCL) Take 1 tablet by mouth two times a day  #20 x 0   Entered by:   Yisroel Ramming MD   Authorized by:   Starleen Arms CMA   Signed by:   Yisroel Ramming MD on 11/15/2009   Method used:   Print then Give to  Patient   RxID:   [id number]  Process Orders Check Orders Results:     [facility]: Order checked:     Starleen Arms CMA NOT AUTHORIZED TO ORDER Tests Sent for requisitioning (November 15, 2009 4:09 PM):     11/15/2009: [facility] -- T-Culture, Wound [[id number]] (signed)

## 2010-11-14 NOTE — Assessment & Plan Note (Signed)
Summary: RASH/VS   CC:  ongoing rash, pt. stopped Isentress on 02/18/10, still burning on urination, and had fever over the weekend.  History of Present Illness: (Interpreter utilized)  Pt's rash has continued to worsen.  She is using benadyl without much relief. She stopped the Isentress 2 days ago. She states that only new medicine that she is taking is the Isentress.  No new soaps, detergents, lotions etc. She c/o burnin on urination. No SOB or swelling of throat.  Preventive Screening-Counseling & Management  Alcohol-Tobacco     Alcohol drinks/day: 0     Smoking Status: never  Caffeine-Diet-Exercise     Caffeine use/day: 0     Diet Comments: patient trying to lose weight     Does Patient Exercise: no     Type of exercise: video exercise     Exercise (avg: min/session): 0     Times/week: <3  Hep-HIV-STD-Contraception     HIV Risk: no risk noted  Safety-Violence-Falls     Seat Belt Use: yes      Sexual History:  none.        Drug Use:  never.        Blood Transfusions:  no.        Travel History:  no.     Updated Prior Medication List: ZOLOFT 50 MG TABS (SERTRALINE HCL) take one pill by mouth every day BACTRIM DS 800-160 MG TABS (SULFAMETHOXAZOLE-TRIMETHOPRIM) Take 1 tablet by mouth once a day PREDNISONE 10 MG TABS (PREDNISONE) take 6 tablets a day for 2 days, then 5 tablets a day for 2 days, then 4 tablets a day for 2 days then 3 tablets a day for 2 days, then 2 tablets a day for 2 days  Current Allergies (reviewed today): ! PCN Past History:  Past Medical History: Last updated: 10/30/2006 HIV infection-dx. 12/2002 Intrauterine pregnancy Tubal ligation Depression  Review of Systems  The patient denies anorexia, fever, and weight loss.    Vital Signs:  Patient profile:   25 year old female Menstrual status:  regular Height:      65.5 inches (166.37 cm) Weight:      224.8 pounds (102.18 kg) BMI:     36.97 Temp:     98.8 degrees F (37.11 degrees C)  oral Pulse rate:   112 / minute BP sitting:   122 / 67  (left arm)  Vitals Entered By: Wendall Mola CMA Duncan Dull) (Feb 20, 2010 3:06 PM) CC: ongoing rash, pt. stopped Isentress on 02/18/10, still burning on urination, had fever over the weekend Is Patient Diabetic? No Pain Assessment Patient in pain? no      Nutritional Status BMI of > 30 = obese Nutritional Status Detail appetite "not good"  Does patient need assistance? Functional Status Self care Ambulation Normal Comments pt. has been taking Benadryl, but it has not been helping, itching is worse at night   Physical Exam  General:  alert, well-developed, well-nourished, and well-hydrated.   Head:  normocephalic and atraumatic.   Mouth:  pharynx pink and moist.   Lungs:  normal breath sounds.   Skin:  diffuse erythematous rash with some welts   Impression & Recommendations:  Problem # 1:  SKIN RASH (ICD-782.1) Most likely an allergic reaction to Isentress.  Pt instructed to remain off Isentress and Truvada.  Will treat with prednisone taper and continue benadryl.  Once rash resolved will need to start a new regimen. Orders: Est. Patient Level III (16109)  Problem #  2:  DYSURIA (ICD-788.1) most likely due to the allergic reaction but will obtain a UA. Her updated medication list for this problem includes:    Bactrim Ds 800-160 Mg Tabs (Sulfamethoxazole-trimethoprim) .Marland Kitchen... Take 1 tablet by mouth once a day  Orders: T-Urinalysis Dipstick only ([id number]) Est. Patient Level III (54098)  Medications Added to Medication List This Visit: 1)  Prednisone 10 Mg Tabs (Prednisone) .... Take 6 tablets a day for 2 days, then 5 tablets a day for 2 days, then 4 tablets a day for 2 days then 3 tablets a day for 2 days, then 2 tablets a day for 2 days  Patient Instructions: 1)  call for appt when rash has resolved 2)  stop isentress and truvada Prescriptions: PREDNISONE 10 MG TABS (PREDNISONE) take 6 tablets a day for 2 days,  then 5 tablets a day for 2 days, then 4 tablets a day for 2 days then 3 tablets a day for 2 days, then 2 tablets a day for 2 days  #40 x 0   Entered and Authorized by:   Yisroel Ramming MD   Signed by:   Yisroel Ramming MD on 02/20/2010   Method used:   Print then Give to Patient   RxID:   [id number]   Laboratory Results   Urine Tests  Date/Time Received: 02-20-10 3:39PM  Routine Urinalysis   Color: yellow Appearance: Cloudy Glucose: negative   (Normal Range: Negative) Bilirubin: negative   (Normal Range: Negative) Ketone: negative   (Normal Range: Negative) Spec. Gravity: 1.020   (Normal Range: 1.003-1.035) Blood: trace-intact   (Normal Range: Negative) pH: 6.0   (Normal Range: 5.0-8.0) Protein: trace   (Normal Range: Negative) Urobilinogen: 0.2   (Normal Range: 0-1) Nitrite: negative   (Normal Range: Negative) Leukocyte Esterace: small   (Normal Range: Negative)    Comments: Mariea Clonts  Feb 20, 2010 4:58 PM
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
## 2010-11-14 NOTE — Miscellaneous (Signed)
Summary: Appointment Canceled  Appointment status changed to canceled by [employer] on 01/06/2010 4:00 PM.  Cancellation Comments --------------------- [facility] F/U/VS  Appointment Information ----------------------- Appt Type:  ID OFFICE VISIT      Date:  Friday, January 27, 2010      Time:  9:30 AM for 15 min   Urgency:  Routine   Made By:  Hoy Finlay Scheduler  To Visit:  Anes [id number]    Reason:  [facility] F/U/VS  Appt Comments ------------- -- 01/06/10 16:00: ([facility]) CANCELED -- [facility] F/U/VS -- 01/06/10 11:28: ([facility]) BOOKED -- Routine ID OFFICE VISIT at 01/27/2010 9:30 AM for 15 min [facility] F/U/VS
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
## 2010-11-14 NOTE — Assessment & Plan Note (Signed)
Summary: F/U/VS   CC:  sore throat, body aches, cough, and congestion.  History of Present Illness: (Interpreter utilized) Pt c/o sore throat, congestion and cough productive of yellow sputum. ? fever She has been taking her Atripla every day. Her leg feels much better - she is going to the wound center. They are recommending a skin graft.  Preventive Screening-Counseling & Management  Alcohol-Tobacco     Alcohol drinks/day: 0     Smoking Status: never   Updated Prior Medication List: ATRIPLA 600-200-300 MG TABS (EFAVIRENZ-EMTRICITAB-TENOFOVIR) Take 1 tablet by mouth at bedtime ZOLOFT 50 MG TABS (SERTRALINE HCL) take one pill by mouth every day PROMETHAZINE HCL 25 MG TABS (PROMETHAZINE HCL) Take 1 tablet by mouth every 8 hours as needed BACTRIM DS 800-160 MG TABS (SULFAMETHOXAZOLE-TRIMETHOPRIM) Take 1 tablet by mouth once a day VICODIN 5-500 MG TABS (HYDROCODONE-ACETAMINOPHEN) Take 1 tablet by mouth every 8 hours prn ZITHROMAX Z-PAK 250 MG TABS (AZITHROMYCIN) take as directed  Current Allergies (reviewed today): ! PCN Past History:  Past Medical History: Last updated: 10/30/2006 HIV infection-dx. 12/2002 Intrauterine pregnancy Tubal ligation Depression  Review of Systems  The patient denies anorexia, chest pain, and hemoptysis.    Vital Signs:  Patient profile:   25 year old female Menstrual status:  regular Height:      65.5 inches (166.37 cm) Weight:      226 pounds (102.73 kg) BMI:     37.17 Temp:     98.7 degrees F (37.06 degrees C) oral Pulse rate:   92 / minute BP sitting:   118 / 83  (left arm)  Vitals Entered By: Starleen Arms CMA (January 06, 2010 10:46 AM) CC: sore throat, body aches, cough, congestion Is Patient Diabetic? No Pain Assessment Patient in pain? no      Nutritional Status BMI of > 30 = obese  Does patient need assistance? Functional Status Self care Ambulation Normal   Physical Exam  General:  alert, well-developed,  well-nourished, and well-hydrated.   Head:  normocephalic and atraumatic.   Mouth:  no exudates and pharyngeal erythema.   Lungs:  normal breath sounds.     Impression & Recommendations:  Problem # 1:  ACUTE BRONCHITIS (ICD-466.0) will treat with a z-pack  Problem # 2:  HIV INFECTION (ICD-042) VL up to 3750. Will obtain a genotype and have pt return in 3 weeks. Orders: T-HIV1 Quant rflx Ultra or Genotype ([id number]) Est. Patient Level IV (47829)  Diagnostics Reviewed:  HIV: CDC-defined AIDS (11/04/2009)   CD4: 130 (12/15/2009)   WBC: 2.0 (12/15/2009)   Hgb: 12.5 (12/15/2009)   HCT: 38.9 (12/15/2009)   Platelets: 299 (12/15/2009) HIV genotype: See Comment (01/26/2009)   HIV-1 RNA: 3750 (12/14/2009)   HBSAg: NO (12/09/2006)  Medications Added to Medication List This Visit: 1)  Bactrim Ds 800-160 Mg Tabs (Sulfamethoxazole-trimethoprim) .... Take 1 tablet by mouth once a day 2)  Zithromax Z-pak 250 Mg Tabs (Azithromycin) .... Take as directed  Patient Instructions: 1)  Please schedule a follow-up appointment in 3 weeks. Prescriptions: ZITHROMAX Z-PAK 250 MG TABS (AZITHROMYCIN) take as directed  #1 pack x 0   Entered and Authorized by:   Yisroel Ramming MD   Signed by:   Yisroel Ramming MD on 01/06/2010   Method used:   Print then Give to Patient   RxID:   [id number] BACTRIM DS 800-160 MG TABS (SULFAMETHOXAZOLE-TRIMETHOPRIM) Take 1 tablet by mouth once a day  #30 x 5   Entered and Authorized by:  Yisroel Ramming MD   Signed by:   Yisroel Ramming MD on 01/06/2010   Method used:   Print then Give to Patient   RxID:   [id number]  Process Orders Check Orders Results:     [facility]: ABN not required for this insurance Tests Sent for requisitioning (January 06, 2010 11:09 AM):     01/06/2010: [facility] -- T-HIV1 Quant rflx Ultra or Genotype [phone] (signed)

## 2010-11-14 NOTE — Assessment & Plan Note (Signed)
Summary: er/fu/meds called for spanish inter/cfb   CC:  follow-up visit, left lower leg draining wound x 5 days, very painful, and was seen at ED 12/09/09 for infection in intestines .  History of Present Illness: (Interpreter utilized)Pt seen in ED about 5 days ago for Abd pain.  They thought she had diverticulitis and gave her flagyl. The pain has resolved. They also debrided her left leg and gave her some Bactrim. The pain i her leg continues as does the drainage. She is on her third antbiotic.  MRI of leg does not show an abcess.  Preventive Screening-Counseling & Management  Alcohol-Tobacco     Alcohol drinks/day: 0     Smoking Status: never  Caffeine-Diet-Exercise     Caffeine use/day: 0     Diet Comments: patient trying to lose weight     Does Patient Exercise: no     Exercise (avg: min/session): 0     Times/week: <3  Hep-HIV-STD-Contraception     HIV Risk: no risk noted   Updated Prior Medication List: ATRIPLA 600-200-300 MG TABS (EFAVIRENZ-EMTRICITAB-TENOFOVIR) Take 1 tablet by mouth at bedtime ZOLOFT 50 MG TABS (SERTRALINE HCL) take one pill by mouth every day PROMETHAZINE HCL 25 MG TABS (PROMETHAZINE HCL) Take 1 tablet by mouth every 8 hours as needed BACTRIM DS 800-160 MG TABS (SULFAMETHOXAZOLE-TRIMETHOPRIM) take one tablet twice a day x ten days FLAGYL 250 MG TABS (METRONIDAZOLE) take one tablet three times a day x ten days VICODIN 5-500 MG TABS (HYDROCODONE-ACETAMINOPHEN) Take 1 tablet by mouth every 8 hours prn  Current Allergies (reviewed today): ! PCN Past History:  Past Medical History: Last updated: 10/30/2006 HIV infection-dx. 12/2002 Intrauterine pregnancy Tubal ligation Depression  Review of Systems  The patient denies anorexia, fever, weight loss, abdominal pain, melena, and hematochezia.    Vital Signs:  Patient profile:   25 year old female Menstrual status:  regular Height:      65.5 inches (166.37 cm) Weight:      224.7 pounds (102.14  kg) BMI:     36.96 Temp:     99.0 degrees F (37.22 degrees C) oral Pulse rate:   80 / minute BP sitting:   125 / 79  (left arm)  Vitals Entered By: Wendall Mola CMA Duncan Dull) (December 14, 2009 3:47 PM) CC: follow-up visit, left lower leg draining wound x 5 days, very painful, was seen at ED 12/09/09 for infection in intestines  Is Patient Diabetic? No Pain Assessment Patient in pain? yes     Location: left leg Intensity: 8 Type: sharp, burning Onset of pain  Constant Nutritional Status BMI of > 30 = obese  Does patient need assistance? Functional Status Self care Ambulation Normal   Physical Exam  General:  alert, well-developed, well-nourished, and well-hydrated.   Head:  normocephalic and atraumatic.   Mouth:  pharynx pink and moist.   Extremities:  chronic scaring of left lowr leg with some cellutlits and wound   Impression & Recommendations:  Problem # 1:  CELLULITIS AND ABSCESS OF LEG EXCEPT FOOT (ICD-682.6) continue bactrim - vicodin for pain will refer to the wound center - I think the area is not healing due to the chronic scaaring.  No abcess seen on MRI. Orders: Est. Patient Level IV (09323) Wound Care Center Referral (Wound Care)  Problem # 2:  HIV INFECTION (ICD-042) continue current meds and f/u in 2 weeks for results. Diagnostics Reviewed:  HIV: CDC-defined AIDS (11/04/2009)   CD4: 110 (10/21/2009)  WBC: 2.3 (10/20/2009)   Hgb: 12.3 (10/20/2009)   HCT: 35.2 (10/20/2009)   Platelets: 271 (10/20/2009) HIV genotype: See Comment (01/26/2009)   HIV-1 RNA: 10000 (10/20/2009)   HBSAg: NO (12/09/2006)  Medications Added to Medication List This Visit: 1)  Bactrim Ds 800-160 Mg Tabs (Sulfamethoxazole-trimethoprim) .... Take one tablet twice a day x ten days 2)  Flagyl 250 Mg Tabs (Metronidazole) .... Take one tablet three times a day x ten days 3)  Vicodin 5-500 Mg Tabs (Hydrocodone-acetaminophen) .... Take 1 tablet by mouth every 8 hours prn  Patient  Instructions: 1)  follow-up in 2 weeks Prescriptions: VICODIN 5-500 MG TABS (HYDROCODONE-ACETAMINOPHEN) Take 1 tablet by mouth every 8 hours prn  #30 x 0   Entered and Authorized by:   Yisroel Ramming MD   Signed by:   Yisroel Ramming MD on 12/14/2009   Method used:   Print then Give to Patient   RxID:   [id number]

## 2010-11-14 NOTE — Assessment & Plan Note (Signed)
Summary: rash all over,itching, thinks she has allergic reaction   CC:  pt. c/o  itchy rash all over body, also vaginal itching, and and bump on vagina.  History of Present Illness: Pt c/o hives for the last few days.  She does not know what it is due to. She did start her new HIV meds about 2 weeks ago. No other new meds. She has been using bendadryl.  She also c/o vaginal itching.  No burning with urination.  Preventive Screening-Counseling & Management  Alcohol-Tobacco     Alcohol drinks/day: 0     Smoking Status: never  Caffeine-Diet-Exercise     Caffeine use/day: 0     Diet Comments: patient trying to lose weight     Does Patient Exercise: no     Type of exercise: video exercise     Exercise (avg: min/session): 0     Times/week: <3  Hep-HIV-STD-Contraception     HIV Risk: no risk noted  Safety-Violence-Falls     Seat Belt Use: yes      Sexual History:  none.        Drug Use:  never.        Blood Transfusions:  no.        Travel History:  no.     Updated Prior Medication List: ZOLOFT 50 MG TABS (SERTRALINE HCL) take one pill by mouth every day BACTRIM DS 800-160 MG TABS (SULFAMETHOXAZOLE-TRIMETHOPRIM) Take 1 tablet by mouth once a day TRUVADA 200-300 MG TABS (EMTRICITABINE-TENOFOVIR) Take 1 tablet by mouth once a day ISENTRESS 400 MG TABS (RALTEGRAVIR POTASSIUM) Take 1 tablet by mouth two times a day FLUCONAZOLE 150 MG TABS (FLUCONAZOLE) Take 1 tablet by mouth once a day  Current Allergies (reviewed today): ! PCN Review of Systems  The patient denies anorexia, fever, and weight loss.    Vital Signs:  Patient profile:   25 year old female Menstrual status:  regular Height:      65.5 inches (166.37 cm) Weight:      222.12 pounds (100.96 kg) BMI:     36.53 Temp:     99.4 degrees F (37.44 degrees C) oral Pulse rate:   105 / minute BP sitting:   138 / 90  (right arm)  Vitals Entered By: Wendall Mola CMA Duncan Dull) (Feb 15, 2010 2:09 PM) CC: pt. c/o   itchy rash all over body, also vaginal itching, and bump on vagina Is Patient Diabetic? No Pain Assessment Patient in pain? no      Nutritional Status BMI of > 30 = obese Nutritional Status Detail appetite "good"  Does patient need assistance? Functional Status Self care Ambulation Normal Comments no missed doses of meds per patient   Physical Exam  General:  alert, well-developed, well-nourished, and well-hydrated.   Head:  normocephalic and atraumatic.   Mouth:  pharynx pink and moist.   Lungs:  normal breath sounds.   Skin:  no current rash   Impression & Recommendations:  Problem # 1:  SKIN RASH (ICD-782.1)  May be secondary to [place].  Will give her solumedral 125mg  IM and see if we can teat through this.  Orders: Est. Patient Level III (16109) Solumedrol up to 125mg  (U0454) Admin of Therapeutic Inj  intramuscular or subcutaneous (09811)  Problem # 2:  VAGINITIS, CANDIDAL (ICD-112.1)  Her updated medication list for this problem includes:    Fluconazole 150 Mg Tabs (Fluconazole) .Marland Kitchen... Take 1 tablet by mouth once a day  Orders: Est. Patient Level  III [phone])  Medications Added to Medication List This Visit: 1)  Fluconazole 150 Mg Tabs (Fluconazole) .... Take 1 tablet by mouth once a day Prescriptions: FLUCONAZOLE 150 MG TABS (FLUCONAZOLE) Take 1 tablet by mouth once a day  #2 x 1   Entered and Authorized by:   Yisroel Ramming MD   Signed by:   Yisroel Ramming MD on 02/15/2010   Method used:   Print then Give to Patient   RxID:   [id number]    Medication Administration  Injection # 1:    Medication: Solumedrol up to 125mg     Diagnosis: SKIN RASH (ICD-782.1)    Route: IM    Site: RUOQ gluteus    Exp Date: 08/2012    Lot #: [id number]    Mfr: pfizer    Patient tolerated injection without complications    Given by: Wendall Mola CMA Duncan Dull) (Feb 15, 2010 3:12 PM)  Medication # 1:    Medication: solu-medrol  Orders Added: 1)  Est. Patient  Level III [95621] 2)  Solumedrol up to 125mg  [J2930] 3)  Admin of Therapeutic Inj  intramuscular or subcutaneous [30865]

## 2010-11-14 NOTE — Miscellaneous (Signed)
  Clinical Lists Changes  Medications: Added new medication of CIPROFLOXACIN HCL 500 MG TABS (CIPROFLOXACIN HCL) Take 1 tablet by mouth two times a day - Signed Rx of CIPROFLOXACIN HCL 500 MG TABS (CIPROFLOXACIN HCL) Take 1 tablet by mouth two times a day;  #14 x 0;  Signed;  Entered by: Yisroel Ramming MD;  Authorized by: Yisroel Ramming MD;  Method used: Print then Give to Patient    Prescriptions: CIPROFLOXACIN HCL 500 MG TABS (CIPROFLOXACIN HCL) Take 1 tablet by mouth two times a day  #14 x 0   Entered and Authorized by:   Yisroel Ramming MD   Signed by:   Yisroel Ramming MD on 02/21/2010   Method used:   Print then Give to Patient   RxID:   [id number]

## 2010-11-14 NOTE — Progress Notes (Signed)
   On 11/15/09, Stephanie from [facility] called and said that Chalyn had come in to see her and was very upset about what the interpreter Ruta Hinds)  had told her the day before while she was here in the clinic and being screened for the HPV study. She said that [place] felt that the interpreter did not understand English well enough to explain to her in Spanish the details of the study and getting consented. She also told her she walked with her outside and told her that people who got infected before 2004 with HIV would die from AIDS. So Rikki Spearing was very upset and wanted the interpreter reported. Subsequently, Aneiska came to the clinic yesterday to see Dr. Philipp Deputy. I talked with her through another interpreter, Harvie Bridge and asked her about what had happenned. Again, she said that she felt the interpreter didn't know English well enough to explain things properly, that she understood a little [place], so she knew that the interpreter was wrong about some of the things she said. Ex:  I had told her she would have to wait 30 minutes in the clinic after she got each injection of HPV vaccine and she said the interpreter told her 2 hours and that she asked her again and she repeated the 2 hours. She said that the interpreter walked outside with her as she left the clinic and told her that women who were over 26 would die from the HPV shot. She was very upset and couldn't sleep the night before. I clarified all of her concerns about the study through the interpreter, specifically reinforcing the side effects, procedures, voluntary participation and research aspects of the study. She said that she understood and wished that Derek Mound had been there the day before. I also explained to her that since she was having problems with her leg that we would have to wait until she was better before doing anything with the study. I called Rashanda in Care management and explained what happenned. She referred me to Theda Belfast? from the [facility] who is over the interpreters form the [facility] program that supplies them for the hospital. [phone]. I talked with Darl Pikes and explained the details of the incident. I verified with her that I did not notice anything inappropriate in the interpreters behavior that day. I also told her that both the patient and the interpreter were new to me, so I did not know how reliable they were. She said that she would discuss it with the interpreter, Ruta Hinds and make sure that she is not placed with [place] again. I also called Arline Asp in Service Excellence to report the incident.Deirdre Evener RN  November 16, 2009 4:09 PM

## 2010-11-14 NOTE — Assessment & Plan Note (Signed)
Summary: reshedule  from 4/15/ f/u [mkj]   CC:  follow-up visit, lab results, pt. has been out of all meds for 3 wks., and have not arrived from mail order.  History of Present Illness: (Interpreter utilized) Pt here for f/u on labs.   Preventive Screening-Counseling & Management  Alcohol-Tobacco     Alcohol drinks/day: 0     Smoking Status: never  Caffeine-Diet-Exercise     Caffeine use/day: 0     Diet Comments: patient trying to lose weight     Does Patient Exercise: no     Exercise (avg: min/session): 0     Times/week: <3  Hep-HIV-STD-Contraception     HIV Risk: no risk noted  Safety-Violence-Falls     Seat Belt Use: yes      Sexual History:  none.        Drug Use:  never.        Blood Transfusions:  no.        Travel History:  no.     Updated Prior Medication List: ZOLOFT 50 MG TABS (SERTRALINE HCL) take one pill by mouth every day BACTRIM DS 800-160 MG TABS (SULFAMETHOXAZOLE-TRIMETHOPRIM) Take 1 tablet by mouth once a day TRUVADA 200-300 MG TABS (EMTRICITABINE-TENOFOVIR) Take 1 tablet by mouth once a day ISENTRESS 400 MG TABS (RALTEGRAVIR POTASSIUM) Take 1 tablet by mouth two times a day  Current Allergies (reviewed today): ! PCN Past History:  Past Medical History: Last updated: 10/30/2006 HIV infection-dx. 12/2002 Intrauterine pregnancy Tubal ligation Depression  Review of Systems  The patient denies anorexia, fever, and weight loss.    Vital Signs:  Patient profile:   25 year old female Menstrual status:  regular Height:      65.5 inches (166.37 cm) Weight:      224.0 pounds (101.82 kg) BMI:     36.84 Temp:     98.8 degrees F (37.11 degrees C) oral Pulse rate:   86 / minute BP sitting:   131 / 81  (right arm)  Vitals Entered By: Wendall Mola CMA Duncan Dull) (February 01, 2010 3:45 PM) CC: follow-up visit, lab results, pt. has been out of all meds for 3 wks., have not arrived from mail order Nutritional Status BMI of > 30 =  obese Nutritional Status Detail appetite "good"  Does patient need assistance? Functional Status Self care Ambulation Normal   Physical Exam  General:  alert, well-developed, well-nourished, and well-hydrated.   Head:  normocephalic and atraumatic.   Lungs:  normal breath sounds.      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 Discussed treatment options.  I will start her on Isentress and Truvada.  Potential side effects were discussed. Pt will return in 6 weeks for labs. Diagnostics Reviewed:  HIV: CDC-defined AIDS (11/04/2009)   CD4: 130 (12/15/2009)   WBC: 2.0 (12/15/2009)   Hgb: 12.5 (12/15/2009)   HCT: 38.9 (12/15/2009)   Platelets: 299 (12/15/2009) HIV genotype: * (01/06/2010)   HIV-1 RNA: 9820 (01/06/2010)   HBSAg: NO (12/09/2006)  Orders: Est. Patient Level IV (99214)Future Orders: T-CD4SP ([facility]) (CD4SP) ... 03/15/2010 T-HIV Viral Load [id number]) ... 03/15/2010 T-Comprehensive Metabolic Panel [id number]) ... 03/15/2010 T-CBC w/Diff ([id number]) ... 03/15/2010  Medications Added to Medication List This Visit: 1)  Truvada 200-300 Mg Tabs (Emtricitabine-tenofovir) .... Take 1 tablet by mouth once a day 2)  Isentress 400 Mg Tabs (Raltegravir potassium) .... Take 1 tablet  by mouth two times a day  Patient Instructions: 1)  Please schedule a follow-up appointment in 8 weeks.  Prescriptions: ISENTRESS 400 MG TABS (RALTEGRAVIR POTASSIUM) Take 1 tablet by mouth two times a day  #60 x 5   Entered and Authorized by:   Yisroel Ramming MD   Signed by:   Yisroel Ramming MD on 02/01/2010   Method used:   Print then Give to Patient   RxID:   [id number] TRUVADA 200-300 MG TABS (EMTRICITABINE-TENOFOVIR) Take 1 tablet by mouth once a day  #30 x 5   Entered and Authorized by:   Yisroel Ramming MD   Signed by:   Yisroel Ramming MD on 02/01/2010   Method used:   Print  then Give to Patient   RxID:   [id number]

## 2010-11-14 NOTE — Miscellaneous (Signed)
Summary: RW update  Clinical Lists Changes  Observations: Added new observation of RACE: African American (06/28/2010 12:31)

## 2010-11-14 NOTE — Assessment & Plan Note (Signed)
Summary: pap smear /dde   Vitals Entered By: Jennet Maduro RN (November 14, 2009 11:26 AM) CC: PAP smear visit.  Left lower leg redness, with large area of scaly discolored skin.  Pt. states that this has been there for 3 weeks, worsen each week.  Yellow drainage at times.  Very painful.  Requesting appt.   Appt. made with Dr. Philipp Deputy for 11/15/2009 @ 3:15 PM.  Pt. given card with appt. information. .sign Pain Assessment Patient in pain? yes     Location: left lower extrmity Intensity: 10 Type: sharp Onset of pain  Constant  Does patient need assistance? Functional Status Self care Ambulation Normal Pt. given educational literature re:  HIV and women, nutrition, relationships, self-esteem and exercise. Pt. declined condoms today.  Stated that she was not sexually active. Jennet Maduro RN  November 14, 2009 12:00 PM  Prior Medications: ATRIPLA 600-200-300 MG TABS (EFAVIRENZ-EMTRICITAB-TENOFOVIR) Take 1 tablet by mouth at bedtime BACTRIM DS 800-160 MG TABS (SULFAMETHOXAZOLE-TRIMETHOPRIM) Take 1 tablet by mouth once a day ZOLOFT 50 MG TABS (SERTRALINE HCL) take one pill by mouth every day PROMETHAZINE HCL 25 MG TABS (PROMETHAZINE HCL) Take 1 tablet by mouth every 8 hours as needed Current Allergies: ! PCN Orders Added: 1)  Est. Patient Level I [54098] 2)  T-PAP [facility]) [11914]  Appended Document: pap smear /dde   Depression History:      The patient denies a depressed mood most of the day and a diminished interest in her usual daily activities.

## 2010-11-16 NOTE — Assessment & Plan Note (Signed)
Summary: ER F/U [MKJ]   CC:  ER followup, swelling left side of neck and face, pt. not on any meds now, and pt. had surgery left leg 09/13/10.  History of Present Illness:   Patient here for emergency room follow-up.  She was seen there for parotid gland swelling.  She was given pain medicine but never filled it.  She states it she is much improved.  She has not been taking her HIV medication for some time now.  She states she did not have any side effects with her medicine she just stopped taking it.  Preventive Screening-Counseling & Management  Alcohol-Tobacco     Alcohol drinks/day: 0     Smoking Status: never  Caffeine-Diet-Exercise     Caffeine use/day: 0     Diet Comments: patient trying to lose weight     Does Patient Exercise: no     Type of exercise: video exercise     Exercise (avg: min/session): 0     Times/week: <3  Hep-HIV-STD-Contraception     HIV Risk: no risk noted  Safety-Violence-Falls     Seat Belt Use: yes      Sexual History:  none.        Drug Use:  never.        Blood Transfusions:  no.        Travel History:  no.     Updated Prior Medication List: BACTRIM DS 800-160 MG TABS (SULFAMETHOXAZOLE-TRIMETHOPRIM) Take 1 tablet by mouth once a day PREDNISONE 10 MG TABS (PREDNISONE) take 6 tablets a day for 2 days, then 5 tablets a day for 2 days, then 4 tablets a day for 2 days then 3 tablets a day for 2 days, then 2 tablets a day for 2 days ISENTRESS 400 MG TABS (RALTEGRAVIR POTASSIUM) Take 1 tablet by mouth two times a day TRUVADA 200-300 MG TABS (EMTRICITABINE-TENOFOVIR) Take 1 tablet by mouth once a day  Current Allergies (reviewed today): ! PCN Past History:  Past Medical History: Last updated: 10/30/2006 HIV infection-dx. 12/2002 Intrauterine pregnancy Tubal ligation Depression  Review of Systems  The patient denies anorexia, fever, and weight loss.    Vital Signs:  Patient profile:   25 year old female Menstrual status:   regular Height:      65.5 inches (166.37 cm) Weight:      224.8 pounds (102.18 kg) BMI:     36.97 Temp:     98.4 degrees F (36.89 degrees C) oral Pulse rate:   84 / minute BP sitting:   117 / 78  (left arm)  Vitals Entered By: Wendall Mola CMA Duncan Dull) (November 01, 2010 3:17 PM) CC: ER followup, swelling left side of neck and face, pt. not on any meds now, pt. had surgery left leg 09/13/10 Is Patient Diabetic? No Pain Assessment Patient in pain? no      Nutritional Status BMI of > 30 = obese Nutritional Status Detail appetite "good"  Have you ever been in a relationship where you felt threatened, hurt or afraid?No   Does patient need assistance? Functional Status Self care Ambulation Normal   Physical Exam  General:  alert, well-developed, well-nourished, and well-hydrated.   Head:  normocephalic and atraumatic.   Parotid galn swelling on left sie of face no erythema  Lungs:  normal breath sounds.     Impression & Recommendations:  Problem # 1:  HIV INFECTION (ICD-042) Will re-strat her HIV meds.  Potential side effects were discussed. She will f/u in  6 weeks for labs.  Diagnostics Reviewed:  HIV: CDC-defined AIDS (11/04/2009)   CD4: 160 (03/17/2010)   WBC: 2.5 (03/16/2010)   Hgb: 12.1 (03/16/2010)   HCT: 36.1 (03/16/2010)   Platelets: 269 (03/16/2010) HIV genotype: * (01/06/2010)   HIV-1 RNA: 6080 (03/16/2010)   HBSAg: NO (12/09/2006)  Orders: Est. Patient Level IV (99214)Future Orders: T-CD4SP ([facility]) (CD4SP) ... 12/13/2010 T-HIV Viral Load [id number]) ... 12/13/2010 T-Comprehensive Metabolic Panel [phone]) ... 12/13/2010 T-CBC w/Diff ([id number]) ... 12/13/2010 T-RPR (Syphilis) [phone]) ... 12/13/2010 T-Lipid Profile [phone]) ... 12/13/2010  Problem # 2:  UNSPECIFIED DISEASE OF THE [place] (ICD-527.9) most likely due to HIV will re-start HIV meds  Medications Added to Medication List This Visit: 1)  Isentress 400 Mg Tabs  (Raltegravir potassium) .... Take 1 tablet by mouth two times a day 2)  Truvada 200-300 Mg Tabs (Emtricitabine-tenofovir) .... Take 1 tablet by mouth once a day  Patient Instructions: 1)  Please schedule a follow-up appointment in 8 weeks, 2 weeks after labs.  Prescriptions: TRUVADA 200-300 MG TABS (EMTRICITABINE-TENOFOVIR) Take 1 tablet by mouth once a day  #30 x 5   Entered and Authorized by:   Yisroel Ramming MD   Signed by:   Yisroel Ramming MD on 11/01/2010   Method used:   Print then Give to Patient   RxID:   [id number] ISENTRESS 400 MG TABS (RALTEGRAVIR POTASSIUM) Take 1 tablet by mouth two times a day  #60 x 5   Entered and Authorized by:   Yisroel Ramming MD   Signed by:   Yisroel Ramming MD on 11/01/2010   Method used:   Print then Give to Patient   RxID:   [id number] BACTRIM DS 800-160 MG TABS (SULFAMETHOXAZOLE-TRIMETHOPRIM) Take 1 tablet by mouth once a day  #30 x 5   Entered and Authorized by:   Yisroel Ramming MD   Signed by:   Yisroel Ramming MD on 11/01/2010   Method used:   Print then Give to Patient   RxID:   [id number]     Appended Document: ER F/U [MKJ]   Immunizations Administered:  Influenza Vaccine # 1:    Vaccine Type: Fluvax Non-MCR    Site: right deltoid    Mfr: Novartis    Dose: 0.5 ml    Route: IM    Given by: Wendall Mola CMA ( AAMA)    Exp. Date: 01/14/2011    Lot #: [id number]    VIS given: 05/09/10 version given November 01, 2010.  Flu Vaccine Consent Questions:    Do you have a history of severe allergic reactions to this vaccine? no    Any prior history of allergic reactions to egg and/or gelatin? no    Do you have a sensitivity to the preservative Thimersol? no    Do you have a past history of Guillan-Barre Syndrome? no    Do you currently have an acute febrile illness? no    Have you ever had a severe reaction to latex? no    Vaccine information given and explained to patient? yes    Are you currently pregnant? no

## 2010-12-26 LAB — URINALYSIS, ROUTINE W REFLEX MICROSCOPIC
Bilirubin Urine: NEGATIVE
Glucose, UA: NEGATIVE mg/dL
Ketones, ur: NEGATIVE mg/dL
Protein, ur: NEGATIVE mg/dL
pH: 6 (ref 5.0–8.0)

## 2010-12-26 LAB — CBC
Hemoglobin: 11.7 g/dL — ABNORMAL LOW (ref 12.0–15.0)
MCH: 28.2 pg (ref 26.0–34.0)
RBC: 4.15 MIL/uL (ref 3.87–5.11)

## 2010-12-26 LAB — DIFFERENTIAL
Basophils Relative: 0 % (ref 0–1)
Eosinophils Absolute: 0 10*3/uL (ref 0.0–0.7)
Eosinophils Relative: 1 % (ref 0–5)
Lymphs Abs: 0.9 10*3/uL (ref 0.7–4.0)
Monocytes Relative: 10 % (ref 3–12)
Neutrophils Relative %: 64 % (ref 43–77)

## 2010-12-26 LAB — T-HELPER CELLS (CD4) COUNT (NOT AT ARMC): CD4 % Helper T Cell: 14 % — ABNORMAL LOW (ref 33–55)

## 2010-12-26 LAB — BASIC METABOLIC PANEL
CO2: 27 mEq/L (ref 19–32)
Calcium: 8.3 mg/dL — ABNORMAL LOW (ref 8.4–10.5)
Chloride: 104 mEq/L (ref 96–112)
GFR calc Af Amer: >60 mL/min (ref >60)
Sodium: 134 mEq/L — ABNORMAL LOW (ref 135–145)

## 2010-12-27 LAB — WET PREP, GENITAL
Clue Cells Wet Prep HPF POC: NONE SEEN
Yeast Wet Prep HPF POC: NONE SEEN

## 2010-12-27 LAB — HERPES SIMPLEX VIRUS CULTURE

## 2010-12-27 LAB — RPR: RPR Ser Ql: NONREACTIVE

## 2010-12-28 LAB — BASIC METABOLIC PANEL
BUN: 9 mg/dL (ref 6–23)
BUN: 8 mg/dL (ref 6–23)
CO2: 25 mEq/L (ref 19–32)
Calcium: 8.4 mg/dL (ref 8.4–10.5)
Chloride: 103 mEq/L (ref 96–112)
Chloride: 102 mEq/L (ref 96–112)
Creatinine, Ser: 0.52 mg/dL (ref 0.4–1.2)
Creatinine, Ser: 0.62 mg/dL (ref 0.4–1.2)
GFR calc Af Amer: >60 mL/min (ref >60)
Glucose, Bld: 110 mg/dL — ABNORMAL HIGH (ref 70–99)
Potassium: 4.3 mEq/L (ref 3.5–5.1)

## 2010-12-28 LAB — CBC
HCT: 36.3 % (ref 36.0–46.0)
MCH: 28.7 pg (ref 26.0–34.0)
MCH: 28.4 pg (ref 26.0–34.0)
MCHC: 34.4 g/dL (ref 30.0–36.0)
MCV: 83.4 fL (ref 78.0–100.0)
MCV: 82.4 fL (ref 78.0–100.0)
Platelets: 198 10*3/uL (ref 150–400)
Platelets: 216 10*3/uL (ref 150–400)
RDW: 14.2 % (ref 11.5–15.5)
RDW: 13.7 % (ref 11.5–15.5)
WBC: 2.7 10*3/uL — ABNORMAL LOW (ref 4.0–10.5)
WBC: 2.5 10*3/uL — ABNORMAL LOW (ref 4.0–10.5)

## 2010-12-28 LAB — PREALBUMIN: Prealbumin: 18 mg/dL (ref 18.0–45.0)

## 2010-12-28 LAB — HEPATIC FUNCTION PANEL
AST: 42 U/L — ABNORMAL HIGH (ref 0–37)
Bilirubin, Direct: 0.1 mg/dL (ref 0.0–0.3)
Indirect Bilirubin: 0.3 mg/dL (ref 0.3–0.9)
Total Bilirubin: 0.4 mg/dL (ref 0.3–1.2)

## 2010-12-28 LAB — POCT HEMOGLOBIN-HEMACUE: Hemoglobin: 11.8 g/dL — ABNORMAL LOW (ref 12.0–15.0)

## 2010-12-31 LAB — URINALYSIS, ROUTINE W REFLEX MICROSCOPIC
Bilirubin Urine: NEGATIVE
Glucose, UA: NEGATIVE mg/dL
Ketones, ur: NEGATIVE mg/dL
Nitrite: NEGATIVE
Specific Gravity, Urine: >1.046 — ABNORMAL HIGH (ref 1.005–1.030)
pH: 5.5 (ref 5.0–8.0)

## 2010-12-31 LAB — BASIC METABOLIC PANEL WITH GFR
BUN: 12 mg/dL (ref 6–23)
CO2: 23 meq/L (ref 19–32)
Calcium: 8.2 mg/dL — ABNORMAL LOW (ref 8.4–10.5)
Chloride: 101 meq/L (ref 96–112)
Creatinine, Ser: 0.62 mg/dL (ref 0.4–1.2)
GFR calc non Af Amer: >60 mL/min
Glucose, Bld: 111 mg/dL — ABNORMAL HIGH (ref 70–99)
Potassium: 3.5 meq/L (ref 3.5–5.1)
Sodium: 128 meq/L — ABNORMAL LOW (ref 135–145)

## 2010-12-31 LAB — T-HELPER CELL (CD4) - (RCID CLINIC ONLY)
CD4 % Helper T Cell: 14 % — ABNORMAL LOW (ref 33–55)
CD4 T Cell Abs: 110 uL — ABNORMAL LOW (ref 400–2700)

## 2010-12-31 LAB — CBC
HCT: 33.8 % — ABNORMAL LOW (ref 36.0–46.0)
Hemoglobin: 11.8 g/dL — ABNORMAL LOW (ref 12.0–15.0)
MCH: 28.9 pg (ref 26.0–34.0)
MCHC: 34.9 g/dL (ref 30.0–36.0)
MCV: 82.8 fL (ref 78.0–100.0)
MCV: 82.7 fL (ref 78.0–100.0)
Platelets: 255 K/uL (ref 150–400)
Platelets: 216 10*3/uL (ref 150–400)
Platelets: 224 10*3/uL (ref 150–400)
RBC: 4.09 MIL/uL (ref 3.87–5.11)
RDW: 14.7 % (ref 11.5–15.5)
RDW: 13.8 % (ref 11.5–15.5)
WBC: 3.6 K/uL — ABNORMAL LOW (ref 4.0–10.5)
WBC: 2.1 10*3/uL — ABNORMAL LOW (ref 4.0–10.5)
WBC: 2.9 10*3/uL — ABNORMAL LOW (ref 4.0–10.5)

## 2010-12-31 LAB — HEMOGLOBIN A1C: Mean Plasma Glucose: 126 mg/dL

## 2010-12-31 LAB — IRON AND TIBC
Iron: 60 ug/dL (ref 42–135)
UIBC: 195 ug/dL

## 2010-12-31 LAB — HEPATIC FUNCTION PANEL
ALT: 29 U/L (ref 0–35)
AST: 36 U/L (ref 0–37)
Albumin: 2.8 g/dL — ABNORMAL LOW (ref 3.5–5.2)
Bilirubin, Direct: 0.1 mg/dL (ref 0.0–0.3)
Total Bilirubin: 0.4 mg/dL (ref 0.3–1.2)

## 2010-12-31 LAB — BASIC METABOLIC PANEL
BUN: 8 mg/dL (ref 6–23)
Calcium: 8.3 mg/dL — ABNORMAL LOW (ref 8.4–10.5)
Calcium: 8.4 mg/dL (ref 8.4–10.5)
GFR calc Af Amer: >60 mL/min (ref >60)
GFR calc non Af Amer: >60 mL/min (ref >60)
GFR calc non Af Amer: >60 mL/min (ref >60)
Glucose, Bld: 96 mg/dL (ref 70–99)
Potassium: 3.9 mEq/L (ref 3.5–5.1)
Sodium: 130 mEq/L — ABNORMAL LOW (ref 135–145)

## 2010-12-31 LAB — DIFFERENTIAL
Basophils Absolute: 0 10*3/uL (ref 0.0–0.1)
Basophils Relative: 0 % (ref 0–1)
Eosinophils Absolute: 0 10*3/uL (ref 0.0–0.7)
Eosinophils Absolute: 0 10*3/uL (ref 0.0–0.7)
Lymphs Abs: 0.9 10*3/uL (ref 0.7–4.0)
Neutro Abs: 1.6 10*3/uL — ABNORMAL LOW (ref 1.7–7.7)
Neutrophils Relative %: 45 % (ref 43–77)
Neutrophils Relative %: 60 % (ref 43–77)

## 2010-12-31 LAB — RAPID STREP SCREEN (MED CTR MEBANE ONLY): Streptococcus, Group A Screen (Direct): NEGATIVE

## 2010-12-31 LAB — VITAMIN B12: Vitamin B-12: 660 pg/mL (ref 211–911)

## 2010-12-31 LAB — QUANTIFERON TB GOLD ASSAY (BLOOD)
Mitogen Minus Nil Value: 1.08 IU/mL
TB Antigen Minus Nil Value: 0.02 IU/mL

## 2010-12-31 LAB — TOXOPLASMA ANTIBODIES- IGG AND  IGM: Toxoplasma IgG Ratio: 532.9 IU/mL

## 2010-12-31 LAB — OSMOLALITY, URINE: Osmolality, Ur: 484 mOsm/kg (ref 390–1090)

## 2010-12-31 LAB — CMV IGM: CMV IgM: <8 AU/mL (ref <30.0)

## 2010-12-31 LAB — POCT I-STAT, CHEM 8
HCT: 37 % (ref 36.0–46.0)
Hemoglobin: 12.6 g/dL (ref 12.0–15.0)
Potassium: 3.8 mEq/L (ref 3.5–5.1)
Sodium: 140 mEq/L (ref 135–145)

## 2010-12-31 LAB — RPR: RPR Ser Ql: NONREACTIVE

## 2010-12-31 LAB — CMV ANTIBODY, IGG (EIA): CMV Ab - IgG: >10 IU/mL — ABNORMAL HIGH (ref <0.4)

## 2010-12-31 LAB — CULTURE, BLOOD (ROUTINE X 2): Culture: NO GROWTH

## 2010-12-31 LAB — RETICULOCYTES
RBC.: 4.04 MIL/uL (ref 3.87–5.11)
Retic Count, Absolute: 64.6 10*3/uL (ref 19.0–186.0)
Retic Ct Pct: 1.6 % (ref 0.4–3.1)

## 2010-12-31 LAB — EPSTEIN-BARR VIRUS VCA ANTIBODY PANEL: EBV EA IgG: 0.7 {ISR}

## 2010-12-31 LAB — MONONUCLEOSIS SCREEN: Mono Screen: NEGATIVE

## 2011-01-01 LAB — T-HELPER CELL (CD4) - (RCID CLINIC ONLY)
CD4 % Helper T Cell: 17 % — ABNORMAL LOW (ref 33–55)
CD4 T Cell Abs: 160 uL — ABNORMAL LOW (ref 400–2700)

## 2011-01-03 LAB — URINALYSIS, ROUTINE W REFLEX MICROSCOPIC
Leukocytes, UA: NEGATIVE
Nitrite: NEGATIVE
Protein, ur: NEGATIVE mg/dL
Specific Gravity, Urine: 1.001 — ABNORMAL LOW (ref 1.005–1.030)
Urobilinogen, UA: 0.2 mg/dL (ref 0.0–1.0)

## 2011-01-03 LAB — URINE MICROSCOPIC-ADD ON

## 2011-01-03 LAB — DIFFERENTIAL
Basophils Absolute: 0 10*3/uL (ref 0.0–0.1)
Eosinophils Relative: 0 % (ref 0–5)
Lymphocytes Relative: 18 % (ref 12–46)
Lymphs Abs: 0.7 10*3/uL (ref 0.7–4.0)
Monocytes Absolute: 0.2 10*3/uL (ref 0.1–1.0)
Monocytes Relative: 6 % (ref 3–12)
Neutro Abs: 3.2 10*3/uL (ref 1.7–7.7)

## 2011-01-03 LAB — POCT PREGNANCY, URINE: Preg Test, Ur: NEGATIVE

## 2011-01-03 LAB — POCT URINALYSIS DIP (DEVICE)
Bilirubin Urine: NEGATIVE
Nitrite: NEGATIVE
Protein, ur: 100 mg/dL — AB
pH: 5.5 (ref 5.0–8.0)

## 2011-01-03 LAB — CBC
MCV: 84.3 fL (ref 78.0–100.0)
Platelets: 237 10*3/uL (ref 150–400)
RDW: 14.2 % (ref 11.5–15.5)
WBC: 4.2 10*3/uL (ref 4.0–10.5)

## 2011-01-03 LAB — WET PREP, GENITAL
Clue Cells Wet Prep HPF POC: NONE SEEN
Trich, Wet Prep: NONE SEEN
WBC, Wet Prep HPF POC: NONE SEEN

## 2011-01-03 LAB — COMPREHENSIVE METABOLIC PANEL
AST: 31 U/L (ref 0–37)
Albumin: 3.3 g/dL — ABNORMAL LOW (ref 3.5–5.2)
Chloride: 101 mEq/L (ref 96–112)
Creatinine, Ser: 0.65 mg/dL (ref 0.4–1.2)
GFR calc Af Amer: >60 mL/min (ref >60)
Total Bilirubin: 0.8 mg/dL (ref 0.3–1.2)
Total Protein: 11.2 g/dL — ABNORMAL HIGH (ref 6.0–8.3)

## 2011-01-07 LAB — T-HELPER CELL (CD4) - (RCID CLINIC ONLY): CD4 % Helper T Cell: 18 % — ABNORMAL LOW (ref 33–55)

## 2011-01-17 LAB — RAPID STREP SCREEN (MED CTR MEBANE ONLY): Streptococcus, Group A Screen (Direct): NEGATIVE

## 2011-01-23 LAB — T-HELPER CELL (CD4) - (RCID CLINIC ONLY): CD4 T Cell Abs: 160 uL — ABNORMAL LOW (ref 400–2700)

## 2011-01-25 LAB — T-HELPER CELL (CD4) - (RCID CLINIC ONLY)
CD4 % Helper T Cell: 16 % — ABNORMAL LOW (ref 33–55)
CD4 T Cell Abs: 140 uL — ABNORMAL LOW (ref 400–2700)

## 2011-01-30 ENCOUNTER — Emergency Department (HOSPITAL_COMMUNITY)
Admission: EM | Admit: 2011-01-30 | Discharge: 2011-01-30 | Disposition: A | Discharge status: Home / Self Care | Payer: Self-pay | Attending: Emergency Medicine | Admitting: Emergency Medicine

## 2011-01-30 DIAGNOSIS — H9209 Otalgia, unspecified ear: Secondary | ICD-10-CM | POA: Insufficient documentation

## 2011-01-30 DIAGNOSIS — J029 Acute pharyngitis, unspecified: Secondary | ICD-10-CM | POA: Insufficient documentation

## 2011-01-30 DIAGNOSIS — Z21 Asymptomatic human immunodeficiency virus [HIV] infection status: Secondary | ICD-10-CM | POA: Insufficient documentation

## 2011-01-30 DIAGNOSIS — J069 Acute upper respiratory infection, unspecified: Secondary | ICD-10-CM | POA: Insufficient documentation

## 2011-01-30 DIAGNOSIS — Z9889 Other specified postprocedural states: Secondary | ICD-10-CM | POA: Insufficient documentation

## 2011-01-30 LAB — CBC
HCT: 35.1 % — ABNORMAL LOW (ref 36.0–46.0)
Platelets: 206 10*3/uL (ref 150–400)
RDW: 14.6 % (ref 11.5–15.5)
WBC: 2.6 10*3/uL — ABNORMAL LOW (ref 4.0–10.5)

## 2011-01-30 LAB — COMPREHENSIVE METABOLIC PANEL
ALT: 21 U/L (ref 0–35)
AST: 28 U/L (ref 0–37)
Albumin: 2.8 g/dL — ABNORMAL LOW (ref 3.5–5.2)
Alkaline Phosphatase: 54 U/L (ref 39–117)
Chloride: 101 mEq/L (ref 96–112)
Potassium: 4.1 mEq/L (ref 3.5–5.1)
Sodium: 131 mEq/L — ABNORMAL LOW (ref 135–145)
Total Bilirubin: 0.7 mg/dL (ref 0.3–1.2)
Total Protein: 11.2 g/dL — ABNORMAL HIGH (ref 6.0–8.3)

## 2011-01-30 LAB — URINALYSIS, ROUTINE W REFLEX MICROSCOPIC
Bilirubin Urine: NEGATIVE
Hgb urine dipstick: NEGATIVE
Ketones, ur: NEGATIVE mg/dL
Nitrite: NEGATIVE
Specific Gravity, Urine: 1.022 (ref 1.005–1.030)
Urobilinogen, UA: 1 mg/dL (ref 0.0–1.0)

## 2011-01-30 LAB — PREGNANCY, URINE: Preg Test, Ur: NEGATIVE

## 2011-01-30 LAB — URINE MICROSCOPIC-ADD ON

## 2011-01-30 LAB — ACETAMINOPHEN LEVEL: Acetaminophen (Tylenol), Serum: <10 ug/mL — ABNORMAL LOW (ref 10–30)

## 2011-01-30 LAB — DIFFERENTIAL
Basophils Absolute: 0 10*3/uL (ref 0.0–0.1)
Basophils Relative: 1 % (ref 0–1)
Eosinophils Absolute: 0 10*3/uL (ref 0.0–0.7)
Eosinophils Relative: 1 % (ref 0–5)
Monocytes Absolute: 0.2 10*3/uL (ref 0.1–1.0)
Monocytes Relative: 7 % (ref 3–12)

## 2011-02-10 ENCOUNTER — Inpatient Hospital Stay (HOSPITAL_COMMUNITY): Payer: Self-pay

## 2011-02-10 ENCOUNTER — Emergency Department (HOSPITAL_COMMUNITY)
Admission: EM | Admit: 2011-02-10 | Discharge: 2011-02-10 | Disposition: A | Discharge status: Other Acute Inpatient Hospital | Payer: Self-pay | Attending: Internal Medicine | Admitting: Internal Medicine

## 2011-02-10 ENCOUNTER — Encounter: Payer: Self-pay | Admitting: Internal Medicine

## 2011-02-10 ENCOUNTER — Emergency Department (HOSPITAL_COMMUNITY): Payer: Self-pay

## 2011-02-10 ENCOUNTER — Inpatient Hospital Stay (HOSPITAL_COMMUNITY)
Admission: EM | Admit: 2011-02-10 | Discharge: 2011-02-11 | DRG: 152 | Disposition: A | Discharge status: Home / Self Care | Payer: Self-pay | Source: Other Acute Inpatient Hospital | Attending: Internal Medicine | Admitting: Internal Medicine

## 2011-02-10 DIAGNOSIS — R220 Localized swelling, mass and lump, head: Secondary | ICD-10-CM | POA: Insufficient documentation

## 2011-02-10 DIAGNOSIS — R599 Enlarged lymph nodes, unspecified: Secondary | ICD-10-CM | POA: Diagnosis present

## 2011-02-10 DIAGNOSIS — Z23 Encounter for immunization: Secondary | ICD-10-CM

## 2011-02-10 DIAGNOSIS — J029 Acute pharyngitis, unspecified: Principal | ICD-10-CM | POA: Diagnosis present

## 2011-02-10 DIAGNOSIS — F3289 Other specified depressive episodes: Secondary | ICD-10-CM | POA: Diagnosis present

## 2011-02-10 DIAGNOSIS — R070 Pain in throat: Secondary | ICD-10-CM | POA: Insufficient documentation

## 2011-02-10 DIAGNOSIS — B20 Human immunodeficiency virus [HIV] disease: Secondary | ICD-10-CM | POA: Diagnosis present

## 2011-02-10 DIAGNOSIS — B9789 Other viral agents as the cause of diseases classified elsewhere: Secondary | ICD-10-CM | POA: Diagnosis present

## 2011-02-10 DIAGNOSIS — F329 Major depressive disorder, single episode, unspecified: Secondary | ICD-10-CM | POA: Diagnosis present

## 2011-02-10 LAB — MONONUCLEOSIS SCREEN: Mono Screen: NEGATIVE

## 2011-02-10 LAB — CBC
Hemoglobin: 12.2 g/dL (ref 12.0–15.0)
MCH: 28.1 pg (ref 26.0–34.0)
MCHC: 34 g/dL (ref 30.0–36.0)
RDW: 13.6 % (ref 11.5–15.5)

## 2011-02-10 LAB — DIFFERENTIAL
Basophils Relative: 0 % (ref 0–1)
Eosinophils Absolute: 0 10*3/uL (ref 0.0–0.7)
Eosinophils Relative: 1 % (ref 0–5)
Monocytes Absolute: 0.3 10*3/uL (ref 0.1–1.0)
Monocytes Relative: 9 % (ref 3–12)
Neutro Abs: 2.4 10*3/uL (ref 1.7–7.7)

## 2011-02-10 LAB — BASIC METABOLIC PANEL
CO2: 26 mEq/L (ref 19–32)
Calcium: 8.9 mg/dL (ref 8.4–10.5)
Creatinine, Ser: 0.71 mg/dL (ref 0.4–1.2)
GFR calc Af Amer: >60 mL/min (ref >60)
GFR calc non Af Amer: >60 mL/min (ref >60)

## 2011-02-10 LAB — RAPID STREP SCREEN (MED CTR MEBANE ONLY): Streptococcus, Group A Screen (Direct): NEGATIVE

## 2011-02-10 MED ORDER — IOHEXOL 300 MG/ML  SOLN
100.0000 mL | Freq: Once | INTRAMUSCULAR | Status: AC | PRN
  Administered 2011-02-10: 100 mL via INTRAVENOUS
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
## 2011-02-11 LAB — BASIC METABOLIC PANEL
Calcium: 8 mg/dL — ABNORMAL LOW (ref 8.4–10.5)
Creatinine, Ser: 0.61 mg/dL (ref 0.4–1.2)
GFR calc Af Amer: >60 mL/min (ref >60)
GFR calc non Af Amer: >60 mL/min (ref >60)
Glucose, Bld: 110 mg/dL — ABNORMAL HIGH (ref 70–99)
Sodium: 133 mEq/L — ABNORMAL LOW (ref 135–145)

## 2011-02-11 LAB — CBC
HCT: 33.7 % — ABNORMAL LOW (ref 36.0–46.0)
Hemoglobin: 11.3 g/dL — ABNORMAL LOW (ref 12.0–15.0)
MCH: 27.8 pg (ref 26.0–34.0)
MCHC: 33.5 g/dL (ref 30.0–36.0)
MCV: 83 fL (ref 78.0–100.0)
Platelets: 212 10*3/uL (ref 150–400)
RBC: 4.06 MIL/uL (ref 3.87–5.11)
RDW: 13.8 % (ref 11.5–15.5)
WBC: 2.6 10*3/uL — ABNORMAL LOW (ref 4.0–10.5)

## 2011-02-11 NOTE — Discharge Summary (Signed)
Brief Summary of Admission History: Admitted with severe sore throat and painful cervical lymphadenopathy. Her 27 yo son had also been sick with sore throat. Has history of chronic cervical lymphadenopathy since 10/2009 thought to be secondary to HIV infection (serologic w/u for CMV and EBV in 12/2009 showed evidence of past infection only). However, the pain with this presentation represented an acute change. CT of neck demonstrated that lymphadenopathy appeared stable since at least 10/2010, which makes a malignant process less likely. Rapid strep test was negative. Ultimately, it was felt that she had a simple viral pharyngitis overlying the chronic, stable lymphadenopathy. On the second day of admission, her sore throat had resolved and she was feeling much better. She was discharged home on her usual medication.   Follow-up: Will need follow-up in the ID clinic for ongoing management of HIV and further care/monitoring of this lymphadenopathy.

## 2011-02-14 LAB — HIV-1 RNA QUANT-NO REFLEX-BLD: HIV-1 RNA Quant, Log: 4.52 {Log} — ABNORMAL HIGH (ref <1.30)

## 2011-02-15 ENCOUNTER — Emergency Department (HOSPITAL_COMMUNITY)
Admission: EM | Admit: 2011-02-15 | Discharge: 2011-02-15 | Disposition: A | Discharge status: Home / Self Care | Payer: Self-pay | Attending: Emergency Medicine | Admitting: Emergency Medicine

## 2011-02-15 DIAGNOSIS — IMO0002 Reserved for concepts with insufficient information to code with codable children: Secondary | ICD-10-CM | POA: Insufficient documentation

## 2011-02-15 DIAGNOSIS — L02419 Cutaneous abscess of limb, unspecified: Secondary | ICD-10-CM | POA: Insufficient documentation

## 2011-02-15 DIAGNOSIS — Z21 Asymptomatic human immunodeficiency virus [HIV] infection status: Secondary | ICD-10-CM | POA: Insufficient documentation

## 2011-02-15 DIAGNOSIS — L03119 Cellulitis of unspecified part of limb: Secondary | ICD-10-CM | POA: Insufficient documentation

## 2011-02-15 DIAGNOSIS — M7989 Other specified soft tissue disorders: Secondary | ICD-10-CM | POA: Insufficient documentation

## 2011-02-15 DIAGNOSIS — X58XXXA Exposure to other specified factors, initial encounter: Secondary | ICD-10-CM | POA: Insufficient documentation

## 2011-02-15 LAB — CBC
Hemoglobin: 12.8 g/dL (ref 12.0–15.0)
MCHC: 34.8 g/dL (ref 30.0–36.0)
Platelets: 271 10*3/uL (ref 150–400)
RDW: 13.8 % (ref 11.5–15.5)

## 2011-02-15 LAB — DIFFERENTIAL
Basophils Absolute: 0 10*3/uL (ref 0.0–0.1)
Basophils Relative: 0 % (ref 0–1)
Eosinophils Absolute: 0 10*3/uL (ref 0.0–0.7)
Eosinophils Relative: 1 % (ref 0–5)
Monocytes Absolute: 0.3 10*3/uL (ref 0.1–1.0)
Neutro Abs: 1.8 10*3/uL (ref 1.7–7.7)

## 2011-02-15 LAB — BASIC METABOLIC PANEL
Calcium: 9 mg/dL (ref 8.4–10.5)
Creatinine, Ser: 0.56 mg/dL (ref 0.4–1.2)
GFR calc Af Amer: >60 mL/min (ref >60)
GFR calc non Af Amer: >60 mL/min (ref >60)
Sodium: 129 mEq/L — ABNORMAL LOW (ref 135–145)

## 2011-02-16 LAB — CULTURE, BLOOD (ROUTINE X 2)
Culture  Setup Time: 201204281750
Culture: NO GROWTH

## 2011-02-20 ENCOUNTER — Encounter (HOSPITAL_BASED_OUTPATIENT_CLINIC_OR_DEPARTMENT_OTHER): Payer: Self-pay

## 2011-02-21 ENCOUNTER — Other Ambulatory Visit (HOSPITAL_COMMUNITY): Payer: Self-pay | Admitting: Orthopedic Surgery

## 2011-02-21 DIAGNOSIS — M79605 Pain in left leg: Secondary | ICD-10-CM

## 2011-02-21 DIAGNOSIS — M86669 Other chronic osteomyelitis, unspecified tibia and fibula: Secondary | ICD-10-CM
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
## 2011-02-26 ENCOUNTER — Ambulatory Visit (HOSPITAL_COMMUNITY)
Admission: RE | Admit: 2011-02-26 | Discharge: 2011-02-26 | Disposition: A | Discharge status: Home / Self Care | Payer: Self-pay | Source: Ambulatory Visit | Attending: Orthopedic Surgery | Admitting: Orthopedic Surgery

## 2011-02-26 DIAGNOSIS — M79605 Pain in left leg: Secondary | ICD-10-CM

## 2011-02-26 DIAGNOSIS — M86669 Other chronic osteomyelitis, unspecified tibia and fibula: Secondary | ICD-10-CM | POA: Insufficient documentation

## 2011-02-28 ENCOUNTER — Other Ambulatory Visit (INDEPENDENT_AMBULATORY_CARE_PROVIDER_SITE_OTHER): Payer: Self-pay

## 2011-02-28 DIAGNOSIS — B20 Human immunodeficiency virus [HIV] disease: Secondary | ICD-10-CM

## 2011-02-28 LAB — CBC WITH DIFFERENTIAL/PLATELET
Basophils Absolute: 0 10*3/uL (ref 0.0–0.1)
Basophils Relative: 0 % (ref 0–1)
Eosinophils Absolute: 0 10*3/uL (ref 0.0–0.7)
Eosinophils Relative: 1 % (ref 0–5)
HCT: 34.5 % — ABNORMAL LOW (ref 36.0–46.0)
Hemoglobin: 11.8 g/dL — ABNORMAL LOW (ref 12.0–15.0)
Lymphocytes Relative: 29 % (ref 12–46)
Lymphs Abs: 0.8 10*3/uL (ref 0.7–4.0)
MCH: 28.8 pg (ref 26.0–34.0)
MCHC: 34.2 g/dL (ref 30.0–36.0)
MCV: 84.1 fL (ref 78.0–100.0)
Monocytes Absolute: 0.3 10*3/uL (ref 0.1–1.0)
Monocytes Relative: 11 % (ref 3–12)
Neutro Abs: 1.6 10*3/uL — ABNORMAL LOW (ref 1.7–7.7)
Neutrophils Relative %: 59 % (ref 43–77)
Platelets: 268 10*3/uL (ref 150–400)
RBC: 4.1 MIL/uL (ref 3.87–5.11)
RDW: 14.3 % (ref 11.5–15.5)
WBC: 2.7 10*3/uL — ABNORMAL LOW (ref 4.0–10.5)

## 2011-03-01 LAB — LIPID PANEL
Cholesterol: 156 mg/dL (ref 0–200)
LDL Cholesterol: 107 mg/dL — ABNORMAL HIGH (ref 0–99)
Total CHOL/HDL Ratio: 5.8 Ratio
Triglycerides: 108 mg/dL (ref <150)
VLDL: 22 mg/dL (ref 0–40)

## 2011-03-01 LAB — COMPLETE METABOLIC PANEL WITH GFR
ALT: 20 U/L (ref 0–35)
Albumin: 3.3 g/dL — ABNORMAL LOW (ref 3.5–5.2)
Alkaline Phosphatase: 50 U/L (ref 39–117)
CO2: 21 mEq/L (ref 19–32)
GFR, Est African American: >60 mL/min (ref >60)
GFR, Est Non African American: >60 mL/min (ref >60)
Glucose, Bld: 84 mg/dL (ref 70–99)
Potassium: 3.9 mEq/L (ref 3.5–5.3)
Sodium: 132 mEq/L — ABNORMAL LOW (ref 135–145)
Total Bilirubin: 0.4 mg/dL (ref 0.3–1.2)
Total Protein: 9.1 g/dL — ABNORMAL HIGH (ref 6.0–8.3)

## 2011-03-01 LAB — T-HELPER CELL (CD4) - (RCID CLINIC ONLY): CD4 % Helper T Cell: 15 % — ABNORMAL LOW (ref 33–55)

## 2011-03-02 NOTE — Discharge Summary (Signed)
Alison Chambers, Alison Chambers                ACCOUNT NO.:  [id number]   MEDICAL RECORD NO.:  [id number]          PATIENT TYPE:  INP   LOCATION:  9122                          FACILITY:  [facility]   PHYSICIAN:  Phil D. Okey Dupre, M.D.     DATE OF BIRTH:  [birth year]   DATE OF ADMISSION:  10/06/2004  DATE OF DISCHARGE:                                 DISCHARGE SUMMARY   ADMISSION DIAGNOSES:  10.  A 25 year old gravida 3 para 2 at term.  2.  Positive human immunodeficiency virus with an elevated titer.  3.  Desired tubal ligation.   DISCHARGE DIAGNOSES:  56.  A 25 year old gravida 3 para 3 postoperative day #3 status post lower      transverse cesarean section and bilateral tubal ligation.  2.  Viable female infant with Apgars 9 and 9.  3.  Human immunodeficiency virus.   DISCHARGE MEDICATIONS:  1.  Antiretrovirals at previous doses.  2.  Ibuprofen 600 mg p.o. q.6h. p.r.n.  3.  Percocet 6/325 one to two p.o. q.4-6h. p.r.n. #30 no refills.  4.  Iron sulfate 325 one p.o. b.i.d. for 1 month.  5.  Prenatal vitamins one p.o. daily for 6 weeks.  6.  Colace 100 mg one p.o. daily while on iron.   ADMISSION HISTORY:  Ms. Eustache was admitted to the hospital at term for a C-  section due to the fact that her HIV titer was too high to permit a vaginal  delivery.  Please see that operative note.   Her postoperative course was uncomplicated.  Her postoperative hemoglobin  was 8.7 - down from 11.2.  She was placed on iron to restore her iron  stores.   HIV.  Her antiretrovirals were continued throughout her hospitalization.  She will follow up with Dr. Roxan Hockey as an outpatient.   CONDITION ON DISCHARGE:  The patient was discharged to home in stable  condition.   INSTRUCTIONS GIVEN TO PATIENT:  The patient was told of the above medical  regimen.  She will follow up with [facility] in 6 weeks and then Dr.  Roxan Hockey at her next scheduled appointment.      LC/MEDQ  D:  10/09/2004  T:  10/09/2004  Job:   [id number]   cc:   Rockey Situ. Flavia Shipper., M.D.  [address]  [place]  [place] [address]  Fax: [phone]

## 2011-03-02 NOTE — Op Note (Signed)
NAMECLYDEAN, POSAS                ACCOUNT NO.:  [id number]   MEDICAL RECORD NO.:  [id number]          PATIENT TYPE:  INP   LOCATION:  9122                          FACILITY:  [facility]   PHYSICIAN:  Phil D. Okey Dupre, M.D.     DATE OF BIRTH:  05-30-1986   DATE OF PROCEDURE:  10/06/2004  DATE OF DISCHARGE:  10/09/2004                                 OPERATIVE REPORT   PROCEDURE:  Low transverse cesarean section.   PREOPERATIVE DIAGNOSES:  HIV with titer.   POSTOPERATIVE DIAGNOSES:  HIV with titer.   DESCRIPTION OF PROCEDURE:  Under satisfactory spinal anesthesia with the  patient in the dorsal supine position, a Foley catheter in her urinary  bladder, the abdomen was prepped and draped in the usual sterile fashion and  entered through a transverse Pfannenstiel incision situated 3 cm above the  symphysis pubis. The entrance was made in typical Pfannenstiel method.  __________ peritoneal cavity. The visceroperitoneum and the anterior surface  of the uterus was opened transversely by sharp and blunt dissection. The  bladder pushed away from the lower uterine segment was entered by sharp and  blunt dissection and from a vertex presentation the baby was easily  delivered. Cord doubly clamped, divided, baby handed to the pediatrician. A  sample of blood taken from the cord for analysis, placenta manually removed.  The uterus explored and closed with a continuous running #0 chromic catgut  suture and an atraumatic needle. The area observed for bleeding, none was  noted and the fascia was closed with continuous running #0 Vicryl on an  atraumatic needle. Subcutaneous bleeders were controlled with hot cautery  and skin edges approximated with skin staples. The patient tolerated the  procedure well, blood loss about 600 mL and was transferred to the recovery  room in satisfactory condition having tolerated the procedure well.  Tape,  instrument, sponge and needle count reported as correct at the end of  the  procedure.      PDR/MEDQ  D:  12/01/2004  T:  12/02/2004  Job:  [id number]

## 2011-03-02 NOTE — Op Note (Signed)
[facility]  Patient:    Alison Chambers, Alison Chambers                         MRN: [id number] Proc. Date: 06/24/00 Adm. Date:  [id number] Disc. Date: [id number] Attending:  Michaelle Copas                           Operative Report  PREOPERATIVE DIAGNOSIS:       Intrauterine pregnancy at 39+ weeks with intrauterine growth retardation, meconium stained amniotic fluid, and fetal bradycardia.  POSTOPERATIVE DIAGNOSIS:      Intrauterine pregnancy at 39+ weeks with intrauterine growth retardation, meconium stained amniotic fluid, and fetal bradycardia.  OPERATION/PROCEDURE:          Primary low transverse cesarean section via Pfannenstiel incision.  SURGEON:                      Roseanna Rainbow, M.D.  ANESTHESIA:                   Epidural.  COMPLICATIONS:                None.  ESTIMATED BLOOD LOSS:         Estimated blood loss was 800 cc.  FLUIDS:                       Lactated Ringer 1300 cc.  URINE OUTPUT:                 Urine output was 300 cc of clear urine.  INDICATIONS FOR PROCEDURE:    The patient is a 25 year old G1 P0, at 39 weeks and undergoing induction secondary to IUGR.  Fetal bradycardia with nadir to 50 beats per minute for several minutes was noted.  Oxytocin with maximum dilatation 7 cms.  FINDINGS:                     Infant in cephalic presentation.  Neonatology present at delivery.  The infant was assigned Apgar scores of 2 at one minute and 7 at five minutes.  Normal uterus, tubes, and ovaries.  DESCRIPTION OF PROCEDURE:     The patient was taken to the operating room, where her epidural anesthetic was felt to be adequate.  She was then prepped and draped in the usual sterile fashion in the dorsal supine position with leftward tilt.  A Pfannenstiel skin incision was then made with the scalpel and the incision carried through to the underlying fascia.  The fascia was also incised with the scalpel and the rectus muscles then  bluntly separated and the parietal peritoneum then entered bluntly.  The bladder blade was inserted and the vesicouterine peritoneum identified, grasped, and entered sharply with Metzenbaum scissors.  This incision was then extended laterally and the bladder flap created digitally.  The bladder blade was then reinserted and the lower uterine segment incised in transverse fashion with the scalpel. The uterine incision was then extended laterally bluntly.  The bladder blade was removed and the head of the infant delivered atraumatically.  The cord was clamped cut and the infant handed off to the awaiting pediatricians.  Cord gases were sent.  The placenta was then removed, the uterus exteriorized and cleared of all clot and debris, and the uterine incision was repaired with 0 Monocryl in running locked fashion.  Excellent hemostasis was  noted.  The uterus was returned to the abdomen and the gutters were cleared of all clot. The fascia was reapproximated with 0 Vicryl in running fashion and the skin closed with staples.  The patient tolerated the procedure well.  An x-ray was performed in lieu of an attempt to count the sponges, laps, and needles. Cefotan 2 g was given at the time of cord clamp.  The patient was taken to the PACU in stable condition. DD:  06/27/00 TD:  06/29/00 Job: [id number]

## 2011-03-02 NOTE — Op Note (Signed)
[name], Alison Chambers            ACCOUNT NO.:  [id number]   MEDICAL RECORD NO.:  [id number]          PATIENT TYPE:  AMB   LOCATION:  [facility]C                          FACILITY:  [facility]   PHYSICIAN:  Ginger Carne, MD  DATE OF BIRTH:  June 23, 1986   DATE OF PROCEDURE:  11/04/2006  DATE OF DISCHARGE:                               OPERATIVE REPORT   PREOPERATIVE DIAGNOSIS:  Left Bartholin's gland abscess.   POSTOP DIAGNOSIS:  Left Bartholin's gland abscess.   PROCEDURE:  Marsupialization of left Bartholin's gland abscess.   SURGEON:  Ginger Carne, MD   ASSISTANT:  None.   COMPLICATIONS:  None immediate.   ESTIMATED BLOOD LOSS:  Minimal.   SPECIMEN:  None.   ANESTHESIA:  Caudal anesthesia.   OPERATIVE FINDINGS:  A 4 cm left Bartholin's gland abscess noted.  Right  side appeared to be normal.  Purulent material noted inside cavity, no  solid areas palpated   OPERATIVE PROCEDURE:  The patient prepped and draped in the usual  fashion, placed in lithotomy position.  Betadine solution used for  antiseptic; and the patient was catheterized prior to the procedure.  After adequate caudal analgesia, a mucosal incision was made over the  left Bartholin's gland abscess.  Following this, a digital examination  released any intracavitary adhesions.  Copious purulent material drained  from same.  A marsupialization was then performed using #0 Vicryl  running interlocking suture.  During the procedure no active bleeding  noted.  The patient tolerated the procedure well, and returned to the  post anesthesia recovery room in excellent condition.      Ginger Carne, MD  Electronically Signed     SHB/MEDQ  D:  11/04/2006  T:  11/04/2006  Job:  [id number]
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
## 2011-03-02 NOTE — Discharge Summary (Signed)
[facility]  Patient:    Alison Chambers, Alison Chambers                         MRN: [id number] Adm. Date:  06/21/00 Disc. Date: 06/27/00 Attending:  Conni Elliot, M.D. Dictator:   Ebbie Ridge, M.D.                           Discharge Summary  PROCEDURES:                   1. Medical induction of labor, 06/22/00.                               2. Assisted rupture of membranes, 06/24/00.                               3. Amnioinfusion, 06/24/00.                               4. Low cervical cesarean section, 06/24/00.                               5. Injection of Depo-Provera, 06/27/00.  DISCHARGE DIAGNOSES:          1. Delivery of single liveborn infant via                                  cesarean section.                               2. Intrauterine growth retardation.                               3. Fetal bradycardia.  DISCHARGE MEDICATIONS:        1. Ibuprofen 600 mg p.o. q.6-8h. p.r.n.                               2. Percocet one p.o. q.6h. p.r.n. severe pain.                               3. Prenatal vitamin one p.o. q.d.  HOSPITAL COURSE:              [name] is a 25 year old Hispanic female who presented to [facility] for induction of labor secondary to IUGR and nonreactive fetal heart tones. Cytotec was placed four times between September 7 and September 10 with only minimal change in the cervix. On September 10 membranes were ruptured and were lightly meconium stained. An IUPC was placed at 1300. At 1430 on June 24, 2000 [name] was taken emergently to the OR for a stat C-section secondary to terminal bradycardia. Her infant had a cord pH of 6.92 at delivery and Apgars 2, 7, and 8 at one, five, and 10 minutes, respectively. The ______ had a seizure shortly after delivery and was taken to the NICU for observation. [name]  postoperative course was complicated only by her concern over her baby. On September 13, postoperative day #3,  [name] was discharged to home with a friend named "Kent". [name] has made arrangements to visit the baby in the NICU. On September 13, [name] did receive an injection of Depo-Provera for contraception.  DISCHARGE FOLLOWUP:           The patient is to follow up at [facility] in six weeks for postpartum check. [name] was also advised to call an orthopedist to schedule an appointment for a previous left lower extremity injury.  DD:  03/27/01 TD:  03/27/01 Job: [id number]

## 2011-03-02 NOTE — Discharge Summary (Signed)
NAMECARNELIA, OSCAR                            ACCOUNT NO.:  [id number]   MEDICAL RECORD NO.:  [id number]                   PATIENT TYPE:  INP   LOCATION:  9135                                 FACILITY:  [facility]   PHYSICIAN:  Lorne Skeens, D.O.                   DATE OF BIRTH:  04/14/1983   DATE OF ADMISSION:  03/30/2003  DATE OF DISCHARGE:  04/02/2003                                 DISCHARGE SUMMARY   DISCHARGE DIAGNOSES:  1. Postpartum normal spontaneous vaginal delivery.  2. HIV positive.  3. History of depression.   DISCHARGE MEDICATIONS:  Include Ibuprofen and prenatal vitamins as well as  HIV medications of Combivir and Reyataz.   HOSPITAL COURSE:  Ms. Paolini is a 25 year old now G2, P2, 0/0/2 that presented  to [facility] in active labor. She has a past medical history significant for HIV  on Combivir and Reyataz and followed by the [facility] and has a history of  previous C-section. Her last vital load was in June of 2004. That was less  than 400 and she was allowed to labor without artificial rupture of  membranes. She was started on AZT intrapartum and delivered a viable term  female at 39-3/7 weeks intrauterine pregnancy under epidural anesthesia over  intact perineum after vacuum attempt with 2 pulls and 2 pop-offs. The infant  was bulb suctioned about the perineum. Cord cut, clamped, and infant handed  to the NICU team. A 3 vessel cord, intact placenta spontaneously delivered  shortly thereafter. Apgar's 7 at 1 and 8 at 5. Estimated blood loss less  than 500 cc's. The baby delivered at 1046 in LOP position. Placenta out at  1050. The patient and infant both stable. Mom plans to bottle feed. She is  getting Depo-Provera for birth control. She desires circumcision which has  been done. After delivery, she was placed back on her Combivir and Reyataz  and infant was treated with AZT and is receiving appropriate followup at  [facility]. It was noted mom's blood type A+, antibody  negative. She  was Rubella immune and was GBS negative. In addition to her normal  postpartum course, due to her absurd behaviour during delivery, she was seen  by Dr. Jeanie Sewer to determine competency, as her other child is under CPS  custody. She was seen by Dr. Jeanie Sewer who deemed her competent to make  medical decisions but did want her to be followed closely by CPS and legal  authorities. At discharge, she was sent home to live with a friend.                                               Lorne Skeens, D.O.    KL/MEDQ  D:  04/30/2003  T:  05/02/2003  Job:  [id number]

## 2011-03-13 ENCOUNTER — Inpatient Hospital Stay (INDEPENDENT_AMBULATORY_CARE_PROVIDER_SITE_OTHER)
Admission: RE | Admit: 2011-03-13 | Discharge: 2011-03-13 | Disposition: A | Discharge status: Home / Self Care | Payer: Self-pay | Source: Ambulatory Visit | Attending: Family Medicine | Admitting: Family Medicine

## 2011-03-13 DIAGNOSIS — N390 Urinary tract infection, site not specified: Secondary | ICD-10-CM

## 2011-03-13 LAB — WET PREP, GENITAL

## 2011-03-13 LAB — GC/CHLAMYDIA PROBE AMP, GENITAL
Chlamydia, DNA Probe: NEGATIVE
GC Probe Amp, Genital: NEGATIVE

## 2011-03-14 ENCOUNTER — Emergency Department (HOSPITAL_COMMUNITY)
Admission: EM | Admit: 2011-03-14 | Discharge: 2011-03-15 | Disposition: A | Discharge status: Nursing Home (Includes ICF) | Payer: Self-pay | Attending: Emergency Medicine | Admitting: Emergency Medicine

## 2011-03-14 ENCOUNTER — Ambulatory Visit: Payer: Self-pay

## 2011-03-14 ENCOUNTER — Other Ambulatory Visit: Payer: Self-pay | Admitting: Licensed Clinical Social Worker

## 2011-03-14 ENCOUNTER — Telehealth: Payer: Self-pay | Admitting: *Deleted

## 2011-03-14 ENCOUNTER — Encounter: Payer: Self-pay | Admitting: Infectious Diseases

## 2011-03-14 ENCOUNTER — Other Ambulatory Visit: Payer: Self-pay | Admitting: Infectious Diseases

## 2011-03-14 ENCOUNTER — Emergency Department (HOSPITAL_COMMUNITY): Payer: Self-pay

## 2011-03-14 ENCOUNTER — Ambulatory Visit (INDEPENDENT_AMBULATORY_CARE_PROVIDER_SITE_OTHER): Payer: Self-pay | Admitting: Infectious Diseases

## 2011-03-14 DIAGNOSIS — L03119 Cellulitis of unspecified part of limb: Secondary | ICD-10-CM

## 2011-03-14 DIAGNOSIS — L02419 Cutaneous abscess of limb, unspecified: Secondary | ICD-10-CM | POA: Insufficient documentation

## 2011-03-14 DIAGNOSIS — R300 Dysuria: Secondary | ICD-10-CM

## 2011-03-14 DIAGNOSIS — L0291 Cutaneous abscess, unspecified: Secondary | ICD-10-CM

## 2011-03-14 DIAGNOSIS — N390 Urinary tract infection, site not specified: Secondary | ICD-10-CM

## 2011-03-14 DIAGNOSIS — M79609 Pain in unspecified limb: Secondary | ICD-10-CM | POA: Insufficient documentation

## 2011-03-14 DIAGNOSIS — B20 Human immunodeficiency virus [HIV] disease: Secondary | ICD-10-CM

## 2011-03-14 DIAGNOSIS — L0390 Cellulitis, unspecified: Secondary | ICD-10-CM

## 2011-03-14 DIAGNOSIS — Z8781 Personal history of (healed) traumatic fracture: Secondary | ICD-10-CM | POA: Insufficient documentation

## 2011-03-14 LAB — POCT URINALYSIS DIP (DEVICE)
Ketones, ur: NEGATIVE mg/dL
Protein, ur: 30 mg/dL — AB
Specific Gravity, Urine: 1.02 (ref 1.005–1.030)
Urobilinogen, UA: 0.2 mg/dL (ref 0.0–1.0)
pH: 6 (ref 5.0–8.0)

## 2011-03-14 LAB — URINE CULTURE

## 2011-03-14 LAB — BASIC METABOLIC PANEL
Calcium: 8.7 mg/dL (ref 8.4–10.5)
Creat: 0.72 mg/dL (ref 0.40–1.20)

## 2011-03-14 NOTE — Telephone Encounter (Signed)
Patient called c/o her leg being hot to the touch and very red and inflamed.  Patient advised to go to ED.  Patient referral for ortho faxed to [facility] outpatient.  They will review and fax back appointment date and time.  Patient aware of this. Wendall Mola CMA

## 2011-03-14 NOTE — Progress Notes (Signed)
  Subjective:    Patient ID: Alison Chambers, female    DOB: 1985/11/03, 25 y.o.   MRN: [id number]  HPI 25 year old woman with a history of  HIV/AIDS who presented to the  [facility] ED 02-08-11 with severe sore throat and neck pain and swelling. She  stated that for the past 2 weeks she had increased sore throat and  painful swelling in her neck and face, left greater than right. She  stated that she was still able to swallow food and saliva, but that it  was also painful. She also endorsed feeling hot and sweating through  her clothes for the last few days, although she did not check her  temperature. She also endorsed nasal congestion and occasional mild dry  cough for the past few days. Her 69-year-old son had also been ill with  sore throat and nasal congestion for the past week. She also endorsed  some ear pain, left greater than right. She has a history of chronic  cervical lymphadenopathy going back to at least January 2011. She was treated as a viral syndrome (HIV) and d/c home on 02-11-11. Her rapid strep was negative. Her CD4 was 110 and her VL was 1160 (02-28-11).  Was seen in urgent care yesterday for uti sx. She was started on bactrim and flagyl. GC/Chlamydia (-), UA many wbc, no clue cells. She continues to have pain and burning with urination and pruritis. Had fever yesterday but resolved with tylenol. Her leg at the site of her previous wound has continued to be red. States that it has been painful for the last 4 weeks.  Her URI sx have resolved.  Denies missed ART.    Review of Systems     Objective:   Physical Exam  Constitutional: She appears well-developed and well-nourished.  Eyes: EOM are normal. Pupils are equal, round, and reactive to light.  Neck: Neck supple.  Cardiovascular: Normal rate, regular rhythm and normal heart sounds.   Pulmonary/Chest: Effort normal and breath sounds normal.  Abdominal: Soft. Bowel sounds are normal. She exhibits no distension.  Skin:              Assessment & Plan:

## 2011-03-14 NOTE — Assessment & Plan Note (Signed)
Will check her CD4 and Vl and check a genotype on her. See her back in 6  weeks.

## 2011-03-14 NOTE — Assessment & Plan Note (Signed)
She is currently on bactrim which has some coverage for this. She needs a MRI and we need to check her WBC to evaluate this. She was to be eval by ortho, will try to re-schedule that.

## 2011-03-14 NOTE — Assessment & Plan Note (Signed)
Will check her UCx.

## 2011-03-15 ENCOUNTER — Encounter: Payer: Self-pay | Admitting: Internal Medicine

## 2011-03-15 ENCOUNTER — Ambulatory Visit: Payer: Self-pay

## 2011-03-15 ENCOUNTER — Inpatient Hospital Stay (HOSPITAL_COMMUNITY): Payer: Self-pay

## 2011-03-15 ENCOUNTER — Inpatient Hospital Stay (HOSPITAL_COMMUNITY)
Admission: EM | Admit: 2011-03-15 | Discharge: 2011-03-16 | DRG: 977 | Disposition: A | Discharge status: Home / Self Care | Payer: Self-pay | Source: Other Acute Inpatient Hospital | Attending: Internal Medicine | Admitting: Internal Medicine

## 2011-03-15 ENCOUNTER — Other Ambulatory Visit (HOSPITAL_COMMUNITY): Payer: Self-pay | Admitting: Orthopedic Surgery

## 2011-03-15 DIAGNOSIS — Z9119 Patient's noncompliance with other medical treatment and regimen: Secondary | ICD-10-CM

## 2011-03-15 DIAGNOSIS — Z91199 Patient's noncompliance with other medical treatment and regimen due to unspecified reason: Secondary | ICD-10-CM

## 2011-03-15 DIAGNOSIS — B20 Human immunodeficiency virus [HIV] disease: Principal | ICD-10-CM | POA: Diagnosis present

## 2011-03-15 DIAGNOSIS — M869 Osteomyelitis, unspecified: Secondary | ICD-10-CM

## 2011-03-15 DIAGNOSIS — F329 Major depressive disorder, single episode, unspecified: Secondary | ICD-10-CM | POA: Diagnosis present

## 2011-03-15 DIAGNOSIS — L02419 Cutaneous abscess of limb, unspecified: Secondary | ICD-10-CM | POA: Diagnosis present

## 2011-03-15 DIAGNOSIS — N390 Urinary tract infection, site not specified: Secondary | ICD-10-CM | POA: Diagnosis present

## 2011-03-15 DIAGNOSIS — F3289 Other specified depressive episodes: Secondary | ICD-10-CM | POA: Diagnosis present

## 2011-03-15 LAB — DIFFERENTIAL
Basophils Absolute: 0 10*3/uL (ref 0.0–0.1)
Lymphocytes Relative: 27 % (ref 12–46)
Lymphs Abs: 0.9 10*3/uL (ref 0.7–4.0)
Monocytes Absolute: 0.2 10*3/uL (ref 0.1–1.0)
Monocytes Relative: 7 % (ref 3–12)
Neutro Abs: 2.1 10*3/uL (ref 1.7–7.7)

## 2011-03-15 LAB — CBC WITH DIFFERENTIAL/PLATELET
Basophils Relative: 0 % (ref 0–1)
Hemoglobin: 12 g/dL (ref 12.0–15.0)
Lymphs Abs: 0.7 10*3/uL (ref 0.7–4.0)
MCHC: 33.2 g/dL (ref 30.0–36.0)
Monocytes Relative: 6 % (ref 3–12)
Neutro Abs: 1.9 10*3/uL (ref 1.7–7.7)
Neutrophils Relative %: 68 % (ref 43–77)
RBC: 4.28 MIL/uL (ref 3.87–5.11)

## 2011-03-15 LAB — CBC
HCT: 35.7 % — ABNORMAL LOW (ref 36.0–46.0)
Hemoglobin: 12.2 g/dL (ref 12.0–15.0)
MCHC: 34.2 g/dL (ref 30.0–36.0)
MCV: 82.4 fL (ref 78.0–100.0)

## 2011-03-15 LAB — POCT I-STAT, CHEM 8
BUN: 8 mg/dL (ref 6–23)
Chloride: 102 mEq/L (ref 96–112)
Creatinine, Ser: 0.9 mg/dL (ref 0.4–1.2)
Glucose, Bld: 98 mg/dL (ref 70–99)
Potassium: 3.7 mEq/L (ref 3.5–5.1)
Sodium: 137 mEq/L (ref 135–145)

## 2011-03-15 LAB — HIV-1 RNA ULTRAQUANT REFLEX TO GENTYP+
HIV 1 RNA Quant: 15100 copies/mL — ABNORMAL HIGH (ref <20)
HIV-1 RNA Quant, Log: 4.18 {Log} — ABNORMAL HIGH (ref <1.30)

## 2011-03-15 LAB — T-HELPER CELL (CD4) - (RCID CLINIC ONLY)
CD4 % Helper T Cell: 14 % — ABNORMAL LOW (ref 33–55)
CD4 T Cell Abs: 100 uL — ABNORMAL LOW (ref 400–2700)

## 2011-03-15 MED ORDER — GADOBENATE DIMEGLUMINE 529 MG/ML IV SOLN
20.0000 mL | Freq: Once | INTRAVENOUS | Status: AC
  Administered 2011-03-15: 20 mL via INTRAVENOUS
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
## 2011-03-16 ENCOUNTER — Inpatient Hospital Stay (HOSPITAL_COMMUNITY): Admission: RE | Admit: 2011-03-16 | Payer: Self-pay | Source: Ambulatory Visit

## 2011-03-16 DIAGNOSIS — L0390 Cellulitis, unspecified: Secondary | ICD-10-CM

## 2011-03-16 DIAGNOSIS — L0291 Cutaneous abscess, unspecified: Secondary | ICD-10-CM

## 2011-03-16 LAB — BASIC METABOLIC PANEL
BUN: 9 mg/dL (ref 6–23)
Calcium: 8.6 mg/dL (ref 8.4–10.5)
GFR calc non Af Amer: >60 mL/min (ref >60)
Glucose, Bld: 103 mg/dL — ABNORMAL HIGH (ref 70–99)
Sodium: 135 mEq/L (ref 135–145)

## 2011-03-16 LAB — CBC
HCT: 35.9 % — ABNORMAL LOW (ref 36.0–46.0)
MCHC: 33.7 g/dL (ref 30.0–36.0)
MCV: 84.1 fL (ref 78.0–100.0)
RDW: 14.1 % (ref 11.5–15.5)

## 2011-03-17 LAB — WOUND CULTURE
Culture: NO GROWTH
Gram Stain: NONE SEEN

## 2011-03-20 ENCOUNTER — Other Ambulatory Visit (HOSPITAL_COMMUNITY): Payer: Self-pay

## 2011-03-21 LAB — CULTURE, BLOOD (ROUTINE X 2): Culture  Setup Time: 201205311226

## 2011-03-22 LAB — HIV-1 GENOTYPR PLUS

## 2011-03-27 ENCOUNTER — Encounter (HOSPITAL_BASED_OUTPATIENT_CLINIC_OR_DEPARTMENT_OTHER): Payer: Self-pay

## 2011-03-29 ENCOUNTER — Ambulatory Visit: Payer: Self-pay | Admitting: Adult Health

## 2011-03-29 ENCOUNTER — Ambulatory Visit: Payer: Self-pay
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
## 2011-04-03 ENCOUNTER — Ambulatory Visit: Payer: Self-pay

## 2011-04-03 ENCOUNTER — Encounter: Payer: Self-pay | Admitting: Adult Health

## 2011-04-03 ENCOUNTER — Ambulatory Visit (INDEPENDENT_AMBULATORY_CARE_PROVIDER_SITE_OTHER): Payer: Self-pay | Admitting: Adult Health

## 2011-04-03 VITALS — BP 110/73 | HR 73 | Temp 98.8°F | Ht 66.0 in | Wt 223.8 lb

## 2011-04-03 DIAGNOSIS — L02419 Cutaneous abscess of limb, unspecified: Secondary | ICD-10-CM

## 2011-04-03 DIAGNOSIS — B20 Human immunodeficiency virus [HIV] disease: Secondary | ICD-10-CM

## 2011-04-03 MED ORDER — EMTRICITABINE-TENOFOVIR DF 200-300 MG PO TABS: 1.0000 | ORAL_TABLET | Freq: Every day | ORAL | Status: DC

## 2011-04-03 MED ORDER — RALTEGRAVIR POTASSIUM 400 MG PO TABS: 400.0000 mg | ORAL_TABLET | Freq: Two times a day (BID) | ORAL | Status: DC

## 2011-04-03 NOTE — Patient Instructions (Addendum)
Continue dressing wound. As was prescribed by Dr. Kelly Splinter. We will discuss your wound care with Dr. Kelly Splinter and decide on plan for followup with her. Stop taking the Atripla. Begin Truvada (blue tablet) one tablet daily. Begin Isentress (peach tablet), one tablet twice daily. Return to clinic in 4 weeks to repeat labs and followup in 6 weeks.

## 2011-04-03 NOTE — Progress Notes (Signed)
  Subjective:    Patient ID: Alison Chambers, female    DOB: 1985-12-26, 25 y.o.   MRN: [id number]  HPI Presents to clinic today for followup on wound to left lower extremity. Has been seen in the hospital and in clinic by Dr. Kelly Splinter, and Dr. Carola Frost regarding a wound on her left lower leg. States there is mild amount of pain in the wound. Has been present for over 8 months. Completed course of Bactrim for the wound and has had a followup with Dr. Kelly Splinter as early as last week. States that wound to the leg is getting worse, not better. However, she was evaluated by orthopedics, who said that she did not have osteomyelitis.   Review of Systems  Constitutional: Positive for activity change. Negative for fever, chills and diaphoresis.  HENT: Negative.   Eyes: Negative.   Respiratory: Negative.   Cardiovascular: Negative.   Gastrointestinal: Negative.   Musculoskeletal: Positive for joint swelling and gait problem.  Skin: Positive for wound.  Neurological: Negative.   Hematological: Negative.   Psychiatric/Behavioral: Negative.        Objective:   Physical Exam  Constitutional: She is oriented to person, place, and time. She appears well-developed and well-nourished. No distress.  HENT:  Head: Normocephalic and atraumatic.  Eyes: Conjunctivae and EOM are normal. Pupils are equal, round, and reactive to light.  Neck: Normal range of motion. Neck supple.  Cardiovascular: Normal rate, regular rhythm and normal heart sounds.   Pulmonary/Chest: Effort normal and breath sounds normal.  Abdominal: Soft. Bowel sounds are normal.  Musculoskeletal:       Difficulty with full weightbearing to left lower extremity  Neurological: She is alert and oriented to person, place, and time. No cranial nerve deficit. Coordination normal.  Skin: She is not diaphoretic.       Large ulcerated wound to the left tib-fib region with a noticeable region of whitish eschar no active drainage, or malodor noted. The  circulation, not easily assessed.  Psychiatric: Her behavior is normal. Thought content normal.       Tearful          Assessment & Plan:  1. Cellulitis, and Abscess Left Lower Leg. We recommend continuing wound care guidelines as established by Dr. Kelly Splinter. We'll speak with Dr. Kelly Splinter tomorrow and discuss her current plans for care to the leg. Since she has been off antibiotics, we will obtain a new culture and sensitivity of the wound. Additionally, we will try to obtain records from recent hospitalizations and subspecialty services to determine their current impressions and plans. Once we are of aware of these plans, we will contact the patient to let her no the appropriate followup.  2. HIV. Genotype was performed on labs that were obtained in May 2012 show resistance to Viramune, and Sustiva. She is currently on Atripla which is effective. Given this resistance profile. Recommend discontinuing Atripla, starting, Truvada 1 tablet by mouth daily, plus Isentress 40 mg by mouth twice a day as a new regimen. Prescriptions were written and electronically sent to the pharmacy. Drug regimen, side effects, adherence, potential adverse reactions, and toxicities discussed in detail. We will have her return in 4 weeks, repeat labs, with a followup in 6 weeks. We'll discuss this with Dr. Ninetta Lights regarding this plan of care.  She verbally acknowledged all information that was provided to her and agreed with plan of care.

## 2011-04-04 ENCOUNTER — Ambulatory Visit: Payer: Self-pay

## 2011-04-04 ENCOUNTER — Telehealth: Payer: Self-pay | Admitting: *Deleted

## 2011-04-04 NOTE — Telephone Encounter (Signed)
I spoke with pt & spouse. Told her to keep appts with Dr. Leone Payor for her leg. Bring all meds to every appt here. They voiced understanding

## 2011-04-04 NOTE — Telephone Encounter (Signed)
LM asking her to call. Will get a translator to speak with her when she calls back. Need to tell her to keep appt with Dr. Kelly Splinter, plastic surgeon. She will follow wound on L lower leg. Also need to tell her to bring all meds to next visit. Bring financials when she makes appt to see Britta Mccreedy to get orange card.

## 2011-04-05 ENCOUNTER — Other Ambulatory Visit: Payer: Self-pay | Admitting: *Deleted

## 2011-04-05 DIAGNOSIS — B20 Human immunodeficiency virus [HIV] disease: Secondary | ICD-10-CM

## 2011-04-05 MED ORDER — SULFAMETHOXAZOLE-TMP DS 800-160 MG PO TABS: 1.0000 | ORAL_TABLET | Freq: Every day | ORAL | Status: DC

## 2011-04-05 MED ORDER — EMTRICITABINE-TENOFOVIR DF 200-300 MG PO TABS: 1.0000 | ORAL_TABLET | Freq: Every day | ORAL | Status: DC

## 2011-04-05 MED ORDER — RALTEGRAVIR POTASSIUM 400 MG PO TABS: 400.0000 mg | ORAL_TABLET | Freq: Two times a day (BID) | ORAL | Status: DC

## 2011-04-05 NOTE — Telephone Encounter (Signed)
Rx printed to go with patients ADAP application

## 2011-04-06 LAB — WOUND CULTURE: Gram Stain: NONE SEEN

## 2011-05-03 ENCOUNTER — Other Ambulatory Visit: Payer: Self-pay | Admitting: Infectious Diseases

## 2011-05-03 ENCOUNTER — Other Ambulatory Visit (INDEPENDENT_AMBULATORY_CARE_PROVIDER_SITE_OTHER): Payer: Self-pay

## 2011-05-03 DIAGNOSIS — B20 Human immunodeficiency virus [HIV] disease: Secondary | ICD-10-CM

## 2011-05-03 LAB — CBC WITH DIFFERENTIAL/PLATELET
Basophils Absolute: 0 10*3/uL (ref 0.0–0.1)
Basophils Relative: 1 % (ref 0–1)
Eosinophils Relative: 1 % (ref 0–5)
HCT: 37.9 % (ref 36.0–46.0)
MCHC: 33.2 g/dL (ref 30.0–36.0)
MCV: 85.7 fL (ref 78.0–100.0)
Monocytes Absolute: 0.2 10*3/uL (ref 0.1–1.0)
RDW: 14.8 % (ref 11.5–15.5)

## 2011-05-04 LAB — COMPLETE METABOLIC PANEL WITH GFR
AST: 22 U/L (ref 0–37)
BUN: 10 mg/dL (ref 6–23)
Calcium: 8.6 mg/dL (ref 8.4–10.5)
Chloride: 100 mEq/L (ref 96–112)
Creat: 0.62 mg/dL (ref 0.50–1.10)

## 2011-05-04 LAB — HIV-1 RNA ULTRAQUANT REFLEX TO GENTYP+
HIV 1 RNA Quant: 15400 copies/mL — ABNORMAL HIGH (ref <20)
HIV-1 RNA Quant, Log: 4.19 {Log} — ABNORMAL HIGH (ref <1.30)

## 2011-05-10 LAB — HIV-1 GENOTYPR PLUS

## 2011-05-15 ENCOUNTER — Ambulatory Visit (INDEPENDENT_AMBULATORY_CARE_PROVIDER_SITE_OTHER): Payer: Self-pay | Admitting: Adult Health

## 2011-05-15 ENCOUNTER — Encounter: Payer: Self-pay | Admitting: Adult Health

## 2011-05-15 ENCOUNTER — Ambulatory Visit: Payer: Self-pay

## 2011-05-15 VITALS — BP 113/75 | HR 64 | Temp 98.6°F | Ht 66.0 in | Wt 227.0 lb

## 2011-05-15 DIAGNOSIS — B20 Human immunodeficiency virus [HIV] disease: Secondary | ICD-10-CM

## 2011-05-15 NOTE — Progress Notes (Signed)
  Subjective:    Patient ID: Alison Chambers, female    DOB: 27-Feb-1986, 25 y.o.   MRN: [id number]  HPI Presents to clinic for scheduled followup. States his only been on her antiretroviral medications for 2 weeks, which she started, after her last blood draw. States she has some problems with her ADAP, but did not inform the clinic staff, that she has not started her medication. She otherwise has been well and has been tolerating the medications as she started, the meds.   Review of Systems  Constitutional: Negative.   HENT: Negative.   Eyes: Negative.   Respiratory: Negative.   Cardiovascular: Negative.   Gastrointestinal: Negative.   Genitourinary: Negative.   Musculoskeletal: Negative.   Skin: Negative.   Neurological: Negative.   Hematological: Negative.   Psychiatric/Behavioral: Negative.        Objective: There is    Physical Exam  Constitutional: She is oriented to person, place, and time. She appears well-developed and well-nourished.  HENT:  Head: Normocephalic and atraumatic.  Mouth/Throat: Oropharynx is clear and moist.  Eyes: Conjunctivae and EOM are normal. Pupils are equal, round, and reactive to light.  Neck: Normal range of motion. Neck supple.  Cardiovascular: Normal rate and regular rhythm.   Pulmonary/Chest: Effort normal and breath sounds normal.  Abdominal: Soft. Bowel sounds are normal.  Musculoskeletal: Normal range of motion.  Neurological: She is alert and oriented to person, place, and time. No cranial nerve deficit. She exhibits normal muscle tone. Coordination normal.  Skin: Skin is warm and dry.  Psychiatric: She has a normal mood and affect. Her behavior is normal. Judgment and thought content normal.          Assessment & Plan:   No problem-specific assessment & plan notes found for this encounter.  1. HIV. Labs obtained 05/03/2011. Show a CD4 count of 100 at 15% with a viral load of 15,400 copies per mL. These values represent. Her current  HIV. Staging without antiretroviral therapy. Recommend her to continue her present management, return in 2 weeks for repeat labs, and followup with [place] in one month to discuss results. She was also instructed to contact the clinic staff. Should she run into access issues regarding her medications.  She verbally acknowledged all this information and agreed with plan of care

## 2011-05-23 ENCOUNTER — Encounter: Payer: Self-pay | Admitting: Adult Health

## 2011-05-23 ENCOUNTER — Ambulatory Visit: Payer: Self-pay

## 2011-05-23 ENCOUNTER — Ambulatory Visit (INDEPENDENT_AMBULATORY_CARE_PROVIDER_SITE_OTHER): Payer: Self-pay | Admitting: Adult Health

## 2011-05-23 VITALS — BP 110/71 | HR 88 | Temp 98.6°F | Wt 226.0 lb

## 2011-05-23 DIAGNOSIS — H6691 Otitis media, unspecified, right ear: Secondary | ICD-10-CM

## 2011-05-23 DIAGNOSIS — H6690 Otitis media, unspecified, unspecified ear: Secondary | ICD-10-CM

## 2011-05-23 MED ORDER — CIPROFLOXACIN HCL 500 MG PO TABS: 500.0000 mg | ORAL_TABLET | Freq: Two times a day (BID) | ORAL | Status: AC

## 2011-05-23 NOTE — Patient Instructions (Addendum)
Infeccin en el odo medio en el nio (Otitis media en el nio) (Middle Ear Infection, Otitis Media, Child) Este tipo de infeccin afecta el espacio que se encuentra detrs del tmpano. A menudo sucede durante un resfro. CUIDADOS EN EL HOGAR  Administre a su nio todos los medicamentos segn las indicaciones del mdico. Hgalo aunque se sienta mejor.   Concurra a las consultas de seguimiento con el profesional que lo asiste, segn le haya indicado.  SOLICITE AYUDA DE INMEDIATO SI:  El dolor empeora.   Su nio tienen una temperatura oral de ms de 102 y no puede controlarla con medicamentos.   Su beb tiene ms de 3 meses y su temperatura rectal es de 102 F (38.9 C) o ms.   Su beb tiene 3 meses o menos y su temperatura rectal es de 100.4 F (38 C) o ms.   El nio est muy inquieto, cansado o confundido.   Siente dolor de [place], de cuello o tiene el cuello rgido.   Sufre diarrea o vmitos.   Comienza a sacudirse (convulsiones).   Los analgsicos no Associate Professor aunque los utilice segn las indicaciones.  ASEGRESE QUE:    Comprende estas instrucciones.   Controlar su enfermedad.   Solicitar ayuda de inmediato si no mejora o si empeora.  Document Released: 07/29/2009 Document Re-Released: 03/21/2010 [facility] Patient Information 2011 [place], [place].  Infeccin en el odo medio en el adulto (Otitis media en el adulto) (Middle Ear Infection, Otitis Media, Adult) Usted tiene una infeccin en el odo medio. Este tipo de infeccin afecta el espacio que se encuentra detrs del tmpano. Este trastorno se denomina "otitis media" Puede ocurrir como consecuencia de un resfro comn. Lo origina un germen que comienza a multiplicarse en ese espacio. Usted podr sentir que las ganglios del cuello que estn del lado de la infeccin se hinchan. INSTRUCCIONES PARA EL CUIDADO DOMICILIARIO  Tome los medicamentos tal como se le ha indicado hasta que se terminen, aun si se siente  [employer] [address]   Utilice los medicamentos de venta libre o de prescripcin para Chief Technology Officer, Environmental health practitioner o la [place], segn se lo indique el profesional que lo asiste.   Ocasionalmente, puede utilizar un descongestivo nasal, un par de [employer] por da para [employer] y Contractor a que las trompas de Data processing manager drenen [place].  Concurra a Educational psychologist para un seguimiento con el profesional que lo asiste luego de 10 a [address], o cuando se le indique, para asegurarse que la infeccin ha desaparecido completamente. SOLICITE ATENCIN MDICA DE INMEDIATO SI:  No obtiene mejora en 2  3 das.   El dolor no se alivia con los [employer].   Siente que [employer] en vez de mejorar.   Si no puede tomar los medicamentos segn se le ha indicado.   Presenta hinchazn, enrojecimiento o dolor alrededor del odo o rigidez en el cuello.  EST SEGURO QUE:  Comprende las instrucciones para el alta mdica.   Controlar su enfermedad.   Solicitar atencin mdica de inmediato segn las indicaciones.  Document Released: 07/11/2005 Document Re-Released: 03/21/2010 [facility] Patient Information 2011 [place], [place].Infeccin en el odo medio en el adulto (Otitis media en el adulto) (Middle Ear Infection, Otitis Media, Adult) Usted tiene una infeccin en el odo medio. Este tipo de infeccin afecta el espacio que se encuentra detrs del tmpano. Este trastorno se denomina "otitis media" Puede ocurrir como consecuencia de un resfro comn. Lo origina un germen que comienza a multiplicarse  en ese espacio. Usted podr sentir que las ganglios del cuello que estn del lado de la infeccin se hinchan. INSTRUCCIONES PARA EL CUIDADO DOMICILIARIO  Tome los medicamentos tal como se le ha indicado hasta que se terminen, aun si se siente [employer] [address]   Utilice los medicamentos de venta libre o de prescripcin para Chief Technology Officer, Environmental health practitioner o la [place], segn se lo indique el profesional que lo  asiste.   Ocasionalmente, puede utilizar un descongestivo nasal, un par de [employer] por da para [employer] y Contractor a que las trompas de Data processing manager drenen [place].  Concurra a Educational psychologist para un seguimiento con el profesional que lo asiste luego de 10 a [address], o cuando se le indique, para asegurarse que la infeccin ha desaparecido completamente. SOLICITE ATENCIN MDICA DE INMEDIATO SI:  No obtiene mejora en 2  3 das.   El dolor no se alivia con los [employer].   Siente que [employer] en vez de mejorar.   Si no puede tomar los medicamentos segn se le ha indicado.   Presenta hinchazn, enrojecimiento o dolor alrededor del odo o rigidez en el cuello.  EST SEGURO QUE:  Comprende las instrucciones para el alta mdica.   Controlar su enfermedad.   Solicitar atencin mdica de inmediato segn las indicaciones.  Document Released: 07/11/2005 Document Re-Released: 03/21/2010 [facility] Patient Information 2011 [place], [place].Infeccin en el odo: el alivio del dolor (Ear Infection, Easing the Pain) Dos de cada tres nios tendrn una infeccin en el odo antes de cumplir el primer ao de edad. Ocho de cada 10 habrn tenido una o ms infecciones antes de [employer] tres aos de [place]. Las infecciones en el odo son la causa ms comn de Administrator en los [place], [place] su tratamiento sigue siendo controvertido. Detrs del tmpano hay una cmara (un espacio vaco) que se denomina odo medio. Dentro de esta cmara hay una serie de tres huesos pequeos. Estos huesos envan la informacin sonora del odo externo, a travs del tmpano, hacia el odo interno en donde las ondas sonoras se convierten en impulsos nerviosos. Estos impulsos nerviosos se envan al cerebro para que sean "odos". Cuando el odo medio se llena de lquido, y [place] lquido se infecta con alguna bacteria (germen) o virus, el nio desarrolla una infeccin en el odo medio. Este problema tambin se denomina  otitis media aguda (repentina). POR QU SE PRODUCEN LAS INFECCIONES EN EL ODO MEDIO Las infecciones en el odo medio generalmente son Neomia Dear complicacin de un resfro comn. El odo medio est conectado a la parte posterior de la garganta por un tnel denominado trompa de [place]. Con la congestin que produce el resfro, las trompas de [place] se congestionan y se bloquean. Esto causa que el aire que se encuentra en el odo medio sea reemplazado con lquido. Debido a que las bacterias generalmente tambin se encuentran atrapadas en el odo medio, el lquido se [place], como as tambin el odo [place]. LOS NIOS SON MS SUSCEPTIBLES Las trompas de [place] son ms angostas y ms cortas en los nios pequeos, de modo que ellos son ms propensos a las infecciones del odo. Los nios ms pequeos tambin padecen ms resfros. Por lo tanto, las infecciones del odo ocurren con ms frecuencia entre los 3 meses y los 4 aos de [place]. Alrededor [employer] Corporation 3 aos de edad, la mitad de los nios habrn sufrido tres o ms infecciones en el odo. Esta enfermedad tambin puede ocurrir [employer]  complicacin de alergias si la nariz y los odos se congestionan. Las infecciones del odo tambin se producen sin ser consecuencia de resfros o Environmental consultant. Algunos nios son ms propensos a las otitis y provienen de familias en las que hay una historia de infecciones en los odos. En muchos casos, en estos nios el odo medio retiene [employer] lquido, debido a que las trompas de [place] no [employer]. SIGNOS MS FRECUENTES DE INFECCIN El signo ms frecuente de infeccin en el odo es Chief Technology Officer, y en segundo lugar la fiebre. El dolor puede ser entre leve e intenso; esto depende de [place], y quizs tambin del tipo particular de bacteria que ocasiona la infeccin. La fiebre puede [employer]Inter[employer] 101 F (38.3 C) y 103 F (39.4 C), pero puede ser ms elevada. En ocasiones, los nios podrn sufrir otitis sin tener  fiebre. El dolor normalmente empeora cuando el nio est recostado, por lo tanto las infecciones generalmente se descubren en medio de la noche. Los bebs y nios muy pequeos no podrn decir que les Yahoo! Inc odos, Biomedical engineer en su lugar harn lo siguiente:  Actuary de las orejas.   Cubrirse los odos.   Actuar con fastidio o agitacin.  Puede resultar confuso para los [place], ya que existen muchas otras cosas que pueden causar dolor de odos, y hay muchas otras cosas que pueden causar fiebre. Por ejemplo, la denticin puede causar dolor de odos, aunque no es frecuente que suba la temperatura. Muchos virus pueden producir Teacher, English as a foreign language, y en los nios pequeos, los virus ocasionan fiebre [place]. Cuando la fiebre es muy elevada, la mayor parte de los nios pequeos estn irritables, y puede ser difcil para los padres saber si les duele algo (y mucho menos el odo). Una buena gua para padres de nios [employer] de 3 meses es llamar al profesional si el nio tiene fiebre y sospecha que se debe a Herbalist de odos. Si ocurre durante la noche y Engineer, maintenance (IT) no parece estar muy enfermo, Artist (y la visita al [employer]) puede esperar hasta la maana siguiente. Para los bebs menores de 3 meses, es mejor llamar al profesional que lo asiste. EL USO DE ANTIBITICOS En los [place] es tradicional tratar a todos los nios con infecciones en el odo con una serie de antibiticos (medicamentos que American Electric Power grmenes). Cuatro de cada diez prescripciones para nios en los [place] son para infecciones del odo. En [place], los antibiticos se usan con mucha menos frecuencia en los nios con infeccin del odo medio. Los mdicos europeos tienden a no prescribir antibiticos a nios con una infeccin leve del odo. Slo lo harn si la infeccin no parece mejorar en los [place] o [place]. Esta estrategia funciona bien, debido a que las infecciones del odo generalmente mejoran con y sin  antibiticos. Sin antibiticos, el 80% de todas las infecciones del odo mejorar en el curso de [place]. Si se trata con antibiticos, slo se agrega un diez por ciento al porcentaje de cura, que llega hasta el noventa por ciento. Del total de nios que sufren una infeccin en el odo, el 60% estar sin dolor en 24 horas, haya recibido o no un antibitico. Se est haciendo cada vez ms frecuente [employer] y los padres de este pas, el hecho de no dar antibiticos a los nios levemente enfermos con infecciones del odo. Los resultados son similares, no importa si se prescriben antibiticos o no. Omitiendo los antibiticos se  ahorrar:  El gasto.   La [employer]     Las posibilidades de reacciones alrgicas al [employer].   Otros efectos secundarios     Pero lo ms importante es que al American International Group antibiticos se reduce la probabilidad de que el nio tenga infecciones por bacterias (grmenes) que se han hecho resistentes a los antibiticos comunes y son ms difciles de Warehouse manager. En los nios con dolor en los odos que no estn muy enfermos, los padres podrn conversar con el profesional si es aconsejable el uso de antibiticos. Si los padres y Restaurant manager, fast food el uso de un antibitico, debern ajustarse a un plan para mantenerse en contacto a las 48 horas, si los sntomas (fiebre y Dentist) Building services engineer. EL ALIVIO DEL DOLOR EN UNA INFECCIN EN EL ODO [place] sido prescritos antibiticos o no, los padres querrn Engineer, materials del [place]. Utilice slo aquellos medicamentos de venta libre o de prescripcin para Chief Technology Officer, Environmental health practitioner o la [place], segn se lo indique el profesional que lo asiste. Otras ideas para ayudar a Engineer, materials incluyen:  Colocar un pao tibio sobre el odo.   Haga que el nio se mantenga sentado la mayor cantidad de tiempo posible.   Aquellos nios lo suficientemente mayores como para Product manager goma de Theatre manager sin tragarla Engineer, maintenance (IT) que su  uso alivia el dolor de odos.  Si el dolor y la fiebre no mejoran en las primeras 48 horas, ya sea que se le haya administrado un antibitico o no, deber contactarse con el profesional que asiste al [place]. Si no se le han prescrito antibiticos, la decisin normalmente ser administrarle alguno. Es posible que sea momento de [place] de antibitico si el nio est tomando alguno y no mejora. La bacteria que causa la infeccin en el odo puede ser resistente al medicamento actual. SON NECESARIOS "TUBOS" PARA DRENAR Y PREVENIR QUE EL LQUIDO SE ACUMULE? Las infecciones del odo generalmente se desarrollan despus de que el lquido se haya acumulado en el odo medio. Este lquido no slo es la causa, sino tambin es el resultado de la infeccin. Despus del tratamiento de la infeccin (o bien despus de que se haya curado por s misma), la trompa de [place] se abre y vuelve a la normalidad. Hasta ese momento, el odo medio contiene lquido y no aire. En la [employer], este lquido desaparece en seis semanas, pero en alrededor del 10% de los nios durar tres meses o ms. Esto se denomina otitis media con derrame. Con respecto a la presencia de lquido en el odo medio:  Licensed conveyancer, algunos nios estn constantemente predispuestos a repetir las infecciones, por el mismo motivo por el cual tuvieron la primera infeccin.   En segundo lugar, cuando hay lquido en el odo medio, la audicin se reduce, al Terex Corporation. Con el tiempo, puede interferir con el aprendizaje o con el desarrollo del lenguaje. (El peligro relacionado con la interferencia con el aprendizaje o con el desarrollo del lenguaje es mucho menor si el lquido ha permanecido solamente durante algunos meses).  La colocacin de tubos en el odo es una opcin cuando:  El nio ha tenido lquido en el odo medio durante 4 a 6 meses.   Las infecciones se repiten.   Otras medidas no logran eliminar el lquido o prevenir las  infecciones.   Los [employer]ot que el nio tiene problemas de audicin significativos.  Estos "tubos" son pequeos conductos plsticos que el otorrinolaringlogo inserta a Annette Stable  del tmpano. En general se utiliza anestesia general. La colocacin del tubo se realiza en el hospital. Estos tubos normalmente se Tourist information centre manager durante 6 a 18 meses. Mantienen el odo medio lleno de aire en lugar de lquido. Generalmente los tubos se salen solos. En algunos nios es necesario insertarlos varias veces en el curso de los primeros aos y en los aos preescolares. La colocacin de tubos en el odo es una de las operaciones ms comunes que realizan los cirujanos otorrinolaringlogos. La insercin de tubos es [address] y, la [place] de las veces, efectivo para Museum/gallery conservator la audicin y reducir el nmero de infecciones en el odo. Pero, como cualquier otra operacin (o tratamiento mdico) los padres deben Lobbyist con el profesional acerca de:  Las ventajas y desventajas   Los posibles riesgos y tratamientos alternativos.  PREVENCIN DE LAS INFECCIONES DEL ODO Hay varias cosas que los padres pueden hacer para reducir el nmero de infecciones en el odo de sus hijos.  Lactancia materna - Los nios que se alimentan con bibern contraen ms infecciones en el odo que los que son amamantados. Existen muchos componentes en la [employer] materna que los protegen contra las infecciones, incluyendo los anticuerpos y los glbulos blancos [place]. [place], se tiende a Museum/gallery exhibitions officer a los bebs en una posicin semisentada, de modo que la [place] no tiende a Nutritional therapist alrededor de las aberturas de las trompas de [employer] en el fondo de la nariz y Administrator. Si lo alimenta a bibern no apoye la botella, sostngala. Alimente al Citigroup se encuentre semisentado, en lugar de hacerlo cuando est acostado. Esto har que el nio trague la [place] y que no la acumule alrededor de las trompas de [place]. Nunca apoye la  botella en una almohada o deje que el nio la tome slo mientras est [place]. Esto se relaciona no slo con la prevencin de infecciones sino tambin con su seguridad general.   Mantenga la casa libre de humo - El humo que exhalan los fumadores puede aumentar el riesgo de sufrir infecciones en los odos en los nios pequeos en un 50%. Es irritante para las vas respiratorias superiores, y favorece las infecciones.   Mantenga las vacunas al da - [employer] Life Insurance vacunas en particular que pueden disminuir las infecciones en el odo. La primera es la Data processing manager, Prevnar. Generalmente los bebs reciben tres dosis en su primer ao de vida, y Neomia Dear dosis de refuerzo despus de su primer cumpleaos. Aunque el propsito principal de esta vacuna es prevenir las infecciones de la sangre y la meningitis, tambin puede reducir las infecciones del odo en alrededor de un 10%. Adems, aquellos bebs vacunados tienen un 20% menos de probabilidades de necesitar tubos de drenaje en los odos. La otra es la vacuna contra la gripe. Aunque la gripe no se considera una causa comn en las infecciones del odo, generalmente causa la congestin que conduce a estas infecciones. En los nios pequeos, alrededor del 20% de los episodios de gripe se complican debido a una infeccin del odo.   Como el resfro es el precursor ms frecuente de las otitis, puede ser til tratar de [employer] nios pequeos que son propensos. La primera lnea de defensa es una buena higiene de las manos para prevenir la diseminacin de los virus, especialmente cuando un miembro de la familia est enfermo. Los bebs y los nios pequeos en guarderas contraen ms resfros e infecciones en el odo que los que permanecen en su casa. Mientras  ms nios tenga la guardera, ms infecciones habr. Puede ser til elegir Neomia Dear guardera a la que concurran pocos nios.  Document Released: 10/01/2005 Document Re-Released: 07/10/2008 [facility]  Patient Information 2011 [place], [place].

## 2011-05-24 NOTE — Progress Notes (Signed)
  Subjective:    Patient ID: Alison Chambers, female    DOB: 1985-12-27, 25 y.o.   MRN: [id number]  HPI Presents to clinic with a chief complaint of a left earache for the past 4-5 days. Complaints of ringing in the ear, pain in the ear, and some periodic discharge out of the year as well. States had run a fever yesterday, but has not had a fever, prior to or since then. Denies sinus congestion, rhinorrhea, cough, or shortness of breath.   Review of Systems  HENT: Positive for ear pain, tinnitus and ear discharge.   Eyes: Negative.   Respiratory: Negative.        Objective:   Physical Exam  HENT:  Right Ear: There is drainage, swelling and tenderness. Tympanic membrane is injected. Tympanic membrane mobility is abnormal.  Left Ear: Hearing, tympanic membrane and external ear normal.          Assessment & Plan:  Right Otitis Media. Cipro 500 mg by mouth twice a day x7 days. Ear ache care. Written and verbal instructions were provided. Was instructed to contact the clinic if symptoms do not improve within the next 5-7 days. She verbally acknowledged all information that was provided to her and agreed with plan of care.

## 2011-05-26 ENCOUNTER — Emergency Department (HOSPITAL_COMMUNITY): Admission: EM | Admit: 2011-05-26 | Payer: Self-pay | Source: Home / Self Care

## 2011-05-29 ENCOUNTER — Emergency Department (HOSPITAL_COMMUNITY)
Admission: EM | Admit: 2011-05-29 | Discharge: 2011-05-29 | Disposition: A | Discharge status: Home / Self Care | Payer: Self-pay | Attending: Emergency Medicine | Admitting: Emergency Medicine

## 2011-05-29 ENCOUNTER — Emergency Department (HOSPITAL_COMMUNITY): Payer: Self-pay

## 2011-05-29 DIAGNOSIS — H9209 Otalgia, unspecified ear: Secondary | ICD-10-CM | POA: Insufficient documentation

## 2011-05-29 DIAGNOSIS — Z21 Asymptomatic human immunodeficiency virus [HIV] infection status: Secondary | ICD-10-CM | POA: Insufficient documentation

## 2011-05-29 DIAGNOSIS — H60399 Other infective otitis externa, unspecified ear: Secondary | ICD-10-CM | POA: Insufficient documentation

## 2011-05-29 LAB — CBC
HCT: 35.1 % — ABNORMAL LOW (ref 36.0–46.0)
Hemoglobin: 12 g/dL (ref 12.0–15.0)
MCHC: 34.2 g/dL (ref 30.0–36.0)
RBC: 4.29 MIL/uL (ref 3.87–5.11)
WBC: 2.6 10*3/uL — ABNORMAL LOW (ref 4.0–10.5)

## 2011-05-29 LAB — DIFFERENTIAL
Basophils Absolute: 0 10*3/uL (ref 0.0–0.1)
Basophils Relative: 0 % (ref 0–1)
Lymphocytes Relative: 36 % (ref 12–46)
Neutro Abs: 1.5 10*3/uL — ABNORMAL LOW (ref 1.7–7.7)
Neutrophils Relative %: 59 % (ref 43–77)

## 2011-05-29 LAB — SEDIMENTATION RATE: Sed Rate: 77 mm/hr — ABNORMAL HIGH (ref 0–22)

## 2011-05-29 LAB — POCT I-STAT, CHEM 8
HCT: 39 % (ref 36.0–46.0)
Hemoglobin: 13.3 g/dL (ref 12.0–15.0)
Potassium: 4.1 mEq/L (ref 3.5–5.1)
Sodium: 137 mEq/L (ref 135–145)
TCO2: 25 mmol/L (ref 0–100)

## 2011-05-29 MED ORDER — IOHEXOL 300 MG/ML  SOLN
100.0000 mL | Freq: Once | INTRAMUSCULAR | Status: AC | PRN
  Administered 2011-05-29: 100 mL via INTRAVENOUS

## 2011-06-01 ENCOUNTER — Other Ambulatory Visit: Payer: Self-pay

## 2011-06-08 ENCOUNTER — Other Ambulatory Visit: Payer: Self-pay

## 2011-06-15 ENCOUNTER — Ambulatory Visit: Payer: Self-pay | Admitting: Adult Health

## 2011-06-22 ENCOUNTER — Ambulatory Visit: Payer: Self-pay | Admitting: Adult Health

## 2011-07-09 LAB — URINALYSIS, ROUTINE W REFLEX MICROSCOPIC
Ketones, ur: NEGATIVE
Leukocytes, UA: NEGATIVE
Nitrite: NEGATIVE
Specific Gravity, Urine: 1.02
pH: 7

## 2011-07-09 LAB — URINE MICROSCOPIC-ADD ON

## 2011-07-09 LAB — GC/CHLAMYDIA PROBE AMP, GENITAL
Chlamydia, DNA Probe: NEGATIVE
GC Probe Amp, Genital: NEGATIVE

## 2011-07-09 LAB — WET PREP, GENITAL
Trich, Wet Prep: NONE SEEN
Yeast Wet Prep HPF POC: NONE SEEN

## 2011-09-28 ENCOUNTER — Encounter: Payer: Self-pay | Admitting: *Deleted

## 2011-10-09 ENCOUNTER — Emergency Department (HOSPITAL_COMMUNITY)
Admission: EM | Admit: 2011-10-09 | Discharge: 2011-10-09 | Disposition: A | Discharge status: Home / Self Care | Payer: Self-pay | Attending: Emergency Medicine | Admitting: Emergency Medicine

## 2011-10-09 ENCOUNTER — Other Ambulatory Visit (HOSPITAL_COMMUNITY): Payer: Self-pay

## 2011-10-09 ENCOUNTER — Encounter (HOSPITAL_COMMUNITY): Payer: Self-pay | Admitting: Emergency Medicine

## 2011-10-09 ENCOUNTER — Emergency Department (HOSPITAL_COMMUNITY): Payer: Self-pay

## 2011-10-09 DIAGNOSIS — R059 Cough, unspecified: Secondary | ICD-10-CM | POA: Insufficient documentation

## 2011-10-09 DIAGNOSIS — T375X6A Underdosing of antiviral drugs, initial encounter: Secondary | ICD-10-CM

## 2011-10-09 DIAGNOSIS — R079 Chest pain, unspecified: Secondary | ICD-10-CM | POA: Insufficient documentation

## 2011-10-09 DIAGNOSIS — R51 Headache: Secondary | ICD-10-CM | POA: Insufficient documentation

## 2011-10-09 DIAGNOSIS — M2180 Other specified acquired deformities of unspecified limb: Secondary | ICD-10-CM | POA: Insufficient documentation

## 2011-10-09 DIAGNOSIS — R509 Fever, unspecified: Secondary | ICD-10-CM | POA: Insufficient documentation

## 2011-10-09 DIAGNOSIS — J3489 Other specified disorders of nose and nasal sinuses: Secondary | ICD-10-CM | POA: Insufficient documentation

## 2011-10-09 DIAGNOSIS — IMO0001 Reserved for inherently not codable concepts without codable children: Secondary | ICD-10-CM | POA: Insufficient documentation

## 2011-10-09 DIAGNOSIS — R05 Cough: Secondary | ICD-10-CM | POA: Insufficient documentation

## 2011-10-09 DIAGNOSIS — Z21 Asymptomatic human immunodeficiency virus [HIV] infection status: Secondary | ICD-10-CM | POA: Insufficient documentation

## 2011-10-09 DIAGNOSIS — R000 Tachycardia, unspecified: Secondary | ICD-10-CM | POA: Insufficient documentation

## 2011-10-09 MED ORDER — ACETAMINOPHEN 500 MG PO TABS
ORAL_TABLET | ORAL | Status: AC
  Administered 2011-10-09: 1000 mg via ORAL
  Filled 2011-10-09: qty 2

## 2011-10-09 MED ORDER — ACETAMINOPHEN 500 MG PO TABS
1000.0000 mg | ORAL_TABLET | Freq: Once | ORAL | Status: AC
  Administered 2011-10-09: 1000 mg via ORAL
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
## 2011-10-09 NOTE — ED Notes (Addendum)
Pt presented to the ER with c/o persistent cough and chest pain secondary to dry cough, pt reports taking OTC med for cough, something like a NyQuil, pt further explains that s/s started yesterday, pt reports chills, generalized aching, night sweats, fever, weakness and dizziness. No wheezing noted upon assessment at this time.

## 2011-10-11 ENCOUNTER — Emergency Department (HOSPITAL_COMMUNITY): Payer: Self-pay

## 2011-10-11 ENCOUNTER — Encounter (HOSPITAL_COMMUNITY): Payer: Self-pay | Admitting: *Deleted

## 2011-10-11 ENCOUNTER — Emergency Department (HOSPITAL_COMMUNITY)
Admission: EM | Admit: 2011-10-11 | Discharge: 2011-10-11 | Disposition: A | Discharge status: Home / Self Care | Payer: Self-pay | Attending: Emergency Medicine | Admitting: Emergency Medicine

## 2011-10-11 DIAGNOSIS — F3289 Other specified depressive episodes: Secondary | ICD-10-CM | POA: Insufficient documentation

## 2011-10-11 DIAGNOSIS — R509 Fever, unspecified: Secondary | ICD-10-CM | POA: Insufficient documentation

## 2011-10-11 DIAGNOSIS — Z21 Asymptomatic human immunodeficiency virus [HIV] infection status: Secondary | ICD-10-CM | POA: Insufficient documentation

## 2011-10-11 DIAGNOSIS — Z79899 Other long term (current) drug therapy: Secondary | ICD-10-CM | POA: Insufficient documentation

## 2011-10-11 DIAGNOSIS — R599 Enlarged lymph nodes, unspecified: Secondary | ICD-10-CM | POA: Insufficient documentation

## 2011-10-11 DIAGNOSIS — R079 Chest pain, unspecified: Secondary | ICD-10-CM | POA: Insufficient documentation

## 2011-10-11 DIAGNOSIS — B20 Human immunodeficiency virus [HIV] disease: Secondary | ICD-10-CM

## 2011-10-11 DIAGNOSIS — J40 Bronchitis, not specified as acute or chronic: Secondary | ICD-10-CM | POA: Insufficient documentation

## 2011-10-11 DIAGNOSIS — R05 Cough: Secondary | ICD-10-CM | POA: Insufficient documentation

## 2011-10-11 DIAGNOSIS — H9209 Otalgia, unspecified ear: Secondary | ICD-10-CM | POA: Insufficient documentation

## 2011-10-11 DIAGNOSIS — R059 Cough, unspecified: Secondary | ICD-10-CM | POA: Insufficient documentation

## 2011-10-11 DIAGNOSIS — IMO0001 Reserved for inherently not codable concepts without codable children: Secondary | ICD-10-CM | POA: Insufficient documentation

## 2011-10-11 DIAGNOSIS — R109 Unspecified abdominal pain: Secondary | ICD-10-CM | POA: Insufficient documentation

## 2011-10-11 DIAGNOSIS — F329 Major depressive disorder, single episode, unspecified: Secondary | ICD-10-CM | POA: Insufficient documentation

## 2011-10-11 LAB — PREGNANCY, URINE: Preg Test, Ur: NEGATIVE

## 2011-10-11 MED ORDER — HYDROCOD POLST-CHLORPHEN POLST 10-8 MG/5ML PO LQCR: 5.0000 mL | Freq: Two times a day (BID) | ORAL | Status: DC | PRN

## 2011-10-11 MED ORDER — DOXYCYCLINE HYCLATE 100 MG PO CAPS: 100.0000 mg | ORAL_CAPSULE | Freq: Two times a day (BID) | ORAL | Status: AC

## 2011-10-11 MED ORDER — HYDROCOD POLST-CHLORPHEN POLST 10-8 MG/5ML PO LQCR
5.0000 mL | Freq: Once | ORAL | Status: AC
  Administered 2011-10-11: 5 mL via ORAL
  Filled 2011-10-11: qty 5

## 2011-10-11 MED ORDER — ANTIPYRINE-BENZOCAINE 5.4-1.4 % OT SOLN
3.0000 [drp] | Freq: Once | OTIC | Status: AC
  Administered 2011-10-11: 3 [drp] via OTIC
  Filled 2011-10-11: qty 10
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
## 2011-10-11 NOTE — ED Notes (Signed)
Patient states symptoms of a cold started x 3 weeks ago and states she saw a doctor for cold symptoms x 4 days ago. Patient states ear pain started last night and states abdominal pain from coughing. Patient states sputum is yellow.

## 2011-10-16 HISTORY — PX: MIDDLE EAR SURGERY: SHX713

## 2011-10-19 ENCOUNTER — Encounter: Payer: Self-pay | Admitting: *Deleted

## 2011-10-23 ENCOUNTER — Ambulatory Visit: Payer: Self-pay | Admitting: Infectious Diseases

## 2011-10-23 ENCOUNTER — Encounter (HOSPITAL_COMMUNITY): Payer: Self-pay | Admitting: *Deleted

## 2011-10-23 ENCOUNTER — Emergency Department (INDEPENDENT_AMBULATORY_CARE_PROVIDER_SITE_OTHER)
Admission: EM | Admit: 2011-10-23 | Discharge: 2011-10-23 | Disposition: A | Discharge status: Home / Self Care | Payer: Self-pay | Source: Home / Self Care | Attending: Family Medicine | Admitting: Family Medicine

## 2011-10-23 DIAGNOSIS — K1120 Sialoadenitis, unspecified: Secondary | ICD-10-CM
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
## 2011-10-23 NOTE — ED Notes (Signed)
Pt  Reports    Swelling  l  Side  Of  Face     X  2  Weeks   She    Reports  Pain  Scale  Of  6    She        She reports   The  Symptoms  X  2  Weeks  She      States   Unable  To      See  Her pcp

## 2011-10-26 ENCOUNTER — Ambulatory Visit (INDEPENDENT_AMBULATORY_CARE_PROVIDER_SITE_OTHER): Payer: Self-pay | Admitting: Infectious Diseases

## 2011-10-26 ENCOUNTER — Encounter: Payer: Self-pay | Admitting: Infectious Diseases

## 2011-10-26 VITALS — BP 120/77 | HR 82 | Temp 98.4°F | Wt 230.0 lb

## 2011-10-26 DIAGNOSIS — Z113 Encounter for screening for infections with a predominantly sexual mode of transmission: Secondary | ICD-10-CM

## 2011-10-26 DIAGNOSIS — K119 Disease of salivary gland, unspecified: Secondary | ICD-10-CM

## 2011-10-26 DIAGNOSIS — B20 Human immunodeficiency virus [HIV] disease: Secondary | ICD-10-CM

## 2011-10-26 DIAGNOSIS — Z79899 Other long term (current) drug therapy: Secondary | ICD-10-CM

## 2011-10-26 LAB — CBC
MCH: 27.9 pg (ref 26.0–34.0)
MCHC: 33.5 g/dL (ref 30.0–36.0)
Platelets: 238 10*3/uL (ref 150–400)
RDW: 13.6 % (ref 11.5–15.5)

## 2011-10-26 LAB — LIPID PANEL
Cholesterol: 184 mg/dL (ref 0–200)
Total CHOL/HDL Ratio: 5.9 Ratio
Triglycerides: 92 mg/dL (ref <150)
VLDL: 18 mg/dL (ref 0–40)

## 2011-10-26 LAB — RPR

## 2011-10-26 LAB — COMPREHENSIVE METABOLIC PANEL
CO2: 25 mEq/L (ref 19–32)
Creat: 0.6 mg/dL (ref 0.50–1.10)
Glucose, Bld: 84 mg/dL (ref 70–99)
Total Bilirubin: 0.5 mg/dL (ref 0.3–1.2)

## 2011-10-26 NOTE — Assessment & Plan Note (Signed)
Needs to take her medicine. Will repeat her labs today. Needs flu shot. rtc 3-4 months (she is not good at making her appts)...Marland Kitchen

## 2011-10-26 NOTE — Assessment & Plan Note (Signed)
Will try to get her into. ENT. I am not sure what this is (TB, sarcoid, MAI, bartonella, benign lymphoepithelial hyperplasia?). I am not sure that this will improve without med adherence/improved immune function.

## 2011-10-26 NOTE — Progress Notes (Signed)
Due to language barrier, an interpreter was present during the history-taking and subsequent discussion (and for part of the physical exam) with this patient. Interpreter Ashby Dawes Namihira 1/11/2013at 10.30  Dr Ninetta Lights

## 2011-10-26 NOTE — Progress Notes (Signed)
  Subjective:    Patient ID: Alison Chambers, female    DOB: Jun 24, 1986, 26 y.o.   MRN: [id number]  HPI 26 yo F with HIV+. Last CD4 100, VL 15,400 (July 2012). 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 She was seen in ED 10-11-11 with febrile illness, cough. Had CXR (-).   For 4 weeks has had swelling and pain in her L ear and side of her face. Was seen in ED 10-23-11 for "parotitis", given supportive care. States she took all the medicine she was given in the hospital, does not remember the names. No further fevers.  States she has missed some doses of her ART, forgets.   Review of Systems     Objective:   Physical Exam  Constitutional: She appears well-developed and well-nourished.  HENT:  Head: Normocephalic.  Right Ear: Tympanic membrane is retracted. Tympanic membrane is not injected, not perforated and not erythematous.  Left Ear: There is tenderness. Tympanic membrane is injected, scarred, erythematous and retracted. Tympanic membrane is not perforated.  Mouth/Throat: Normal dentition. No dental abscesses, uvula swelling or dental caries. No oropharyngeal exudate.  Eyes: EOM are normal. Pupils are equal, round, and reactive to light.  Neck:    Cardiovascular: Normal rate, regular rhythm and normal heart sounds.   Pulmonary/Chest: Effort normal and breath sounds normal.  Abdominal: Soft. Bowel sounds are normal. There is no tenderness.          Assessment & Plan:

## 2011-10-29 ENCOUNTER — Telehealth: Payer: Self-pay | Admitting: *Deleted

## 2011-10-29 NOTE — Telephone Encounter (Signed)
Patient was made aware she needs the [facility] Card before we can refer her out to an ENT as the provider requested. She was to stop by and see Kandice Robinsons (in our office) on her way out. After talking with Ms Jeraldine Loots she stated that she did not have the information she needed to proceed and that it will take her some time to get it together. Her ID is expired and she will have to do a lot if foot work to get it taken care of. She made an appointment to come back to do the paperwork 11/22/11 with Britta Mccreedy. She is aware that we can not refer her until she gets the [facility] card. She was also told that as soon as she gets the card to let me know so I can finish the referral.

## 2011-11-21 ENCOUNTER — Ambulatory Visit: Payer: Self-pay

## 2011-12-05 ENCOUNTER — Ambulatory Visit: Payer: Self-pay

## 2011-12-28 ENCOUNTER — Encounter (HOSPITAL_COMMUNITY): Payer: Self-pay | Admitting: Emergency Medicine

## 2011-12-28 ENCOUNTER — Emergency Department (HOSPITAL_COMMUNITY)
Admission: EM | Admit: 2011-12-28 | Discharge: 2011-12-28 | Disposition: A | Discharge status: Home / Self Care | Payer: Self-pay | Attending: Emergency Medicine | Admitting: Emergency Medicine

## 2011-12-28 ENCOUNTER — Other Ambulatory Visit: Payer: Self-pay | Admitting: *Deleted

## 2011-12-28 DIAGNOSIS — H60399 Other infective otitis externa, unspecified ear: Secondary | ICD-10-CM | POA: Insufficient documentation

## 2011-12-28 DIAGNOSIS — H66009 Acute suppurative otitis media without spontaneous rupture of ear drum, unspecified ear: Secondary | ICD-10-CM | POA: Insufficient documentation

## 2011-12-28 DIAGNOSIS — B20 Human immunodeficiency virus [HIV] disease: Secondary | ICD-10-CM

## 2011-12-28 DIAGNOSIS — H6091 Unspecified otitis externa, right ear: Secondary | ICD-10-CM

## 2011-12-28 DIAGNOSIS — H9209 Otalgia, unspecified ear: Secondary | ICD-10-CM | POA: Insufficient documentation

## 2011-12-28 DIAGNOSIS — Z21 Asymptomatic human immunodeficiency virus [HIV] infection status: Secondary | ICD-10-CM | POA: Insufficient documentation

## 2011-12-28 MED ORDER — OXYCODONE-ACETAMINOPHEN 5-325 MG PO TABS: 1.0000 | ORAL_TABLET | Freq: Four times a day (QID) | ORAL | Status: DC | PRN

## 2011-12-28 MED ORDER — ANTIPYRINE-BENZOCAINE 5.4-1.4 % OT SOLN
3.0000 [drp] | Freq: Once | OTIC | Status: AC
  Administered 2011-12-28: 4 [drp] via OTIC
  Filled 2011-12-28: qty 10

## 2011-12-28 MED ORDER — CIPROFLOXACIN-DEXAMETHASONE 0.3-0.1 % OT SUSP: 4.0000 [drp] | Freq: Two times a day (BID) | OTIC | Status: DC

## 2011-12-28 MED ORDER — CLARITHROMYCIN 500 MG PO TABS: 500.0000 mg | ORAL_TABLET | Freq: Two times a day (BID) | ORAL | Status: DC

## 2011-12-28 MED ORDER — OXYCODONE-ACETAMINOPHEN 5-325 MG PO TABS
1.0000 | ORAL_TABLET | Freq: Once | ORAL | Status: AC
  Administered 2011-12-28: 1 via ORAL
  Filled 2011-12-28: qty 1

## 2011-12-28 NOTE — ED Notes (Signed)
Waiting ear drops from pharmacy.  Patient resting and waiting for medication from pharmacy.

## 2011-12-28 NOTE — ED Notes (Signed)
Rt ear pain x 1 week.  
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
## 2011-12-28 NOTE — Discharge Instructions (Signed)
Otitis media en el adulto (Otitis Media, Adult) Usted tiene una infeccin en el odo medio. Este tipo de infeccin afecta el espacio que se encuentra detrs del tmpano. Este trastorno se denomina "otitis media" Puede ocurrir como consecuencia de un resfro comn. Lo origina un germen que comienza a multiplicarse en ese espacio. Usted podr sentir que las ganglios del cuello que estn del lado de la infeccin se hinchan. INSTRUCCIONES PARA EL CUIDADO DOMICILIARIO  Tome los medicamentos tal como se le ha indicado hasta que se terminen, aun si se siente [employer] [address]   Utilice los medicamentos de venta libre o de prescripcin para Chief Technology Officer, Environmental health practitioner o la [place], segn se lo indique el profesional que lo asiste.   Ocasionalmente, puede utilizar un descongestivo nasal, un par de [employer] por da para [employer] y Contractor a que las trompas de Data processing manager drenen [place].  Concurra a Educational psychologist para un seguimiento con el profesional que lo asiste luego de 10 a [address], o cuando se le indique, para asegurarse que la infeccin ha desaparecido completamente. SOLICITE ATENCIN MDICA DE INMEDIATO SI:  No obtiene mejora en 2  3 das.   El dolor no se alivia con los [employer].   Siente que [employer] en vez de mejorar.   Si no puede tomar los medicamentos segn se le ha indicado.   Presenta hinchazn, enrojecimiento o dolor alrededor del odo o rigidez en el cuello.  EST SEGURO QUE:  Comprende las instrucciones para el alta mdica.   Controlar su enfermedad.   Solicitar atencin mdica de inmediato segn las indicaciones.  Document Released: 07/11/2005 Document Revised: 09/20/2011 [facility] Patient Information 2012 [place], [place].       Otitis externa (Otitis Externa) Usted tiene una otitis externa ("odo de nadador"). La otitis externa es una infeccin bacteriana (grmenes) o una infeccin causada por hongos en el canal auditivo externo (desde el tmpano hasta  el exterior del odo). Nadar en agua sucia puede ocasionar este problema. Tambin puede ocasionarlo la humedad que queda en el odo despus de nadar o de darse un bao. En general, el primer signo de infeccin es la picazn en el canal auditivo. Esto puede continuar en la inflamacin del canal auditivo, su enrojecimiento, y la secrecin de pus, lo cual puede ser un sntoma de infeccin. INSTRUCCIONES PARA EL CUIDADO DOMICILIARIO  Coloque el antibitico en gotas en el canal auditivo de la manera indicada por su mdico.   Esta puede ser una enfermedad muy dolorosa. Le podrn prescribir un analgsico fuerte.   Utilice los medicamentos de venta libre o de prescripcin para Chief Technology Officer, Environmental health practitioner o la [place], segn se lo indique el profesional que lo asiste.   Si el profesional que lo [employer] pide que concurra a una cita de seguimiento, es importante asistir a ella. Si no cumple con el seguimiento podr resultar en una lesin crnica o permanente, dolor, prdida de la audicin e incapacidad. Si tiene algn problema para asistir a la cita, debe comunicarse con el establecimiento para obtener asistencia.  PREVENCIN  Es Primary school teacher el odo seco. Use la punta de una toalla para sacudir el agua del canal auditivo despus de nadar o del bao.   Evite rascarse el odo. Esto puede daar el canal auditivo o remover el recubrimiento protector de cera y as Child psychotherapist de grmenes (bacterias) o de los hongos.   Podr utilizar gotas para el odo hechas de alcohol y vinagre luego de Programmer, systems  para prevenir infecciones futuras. Prepare una botella pequea con partes iguales de vinagre blanco y alcohol. Coloque 3 o 4 gotas en cada odo luego de nadar.   Evite nadar en lagos, agua contaminada, o piscinas con poco cloro.  SOLICITE ATENCIN MDICA SI:  La temperatura oral se eleva sin motivo por encima de 102 F (38.9 C).   El odo le sigue doliendo despus de 3 das y observa sntomas de que  empeora (enrojecimiento, hinchazn dolor o pus).  EST SEGURO QUE:   Comprende las instrucciones para el alta mdica.   Controlar su enfermedad.   Solicitar atencin mdica de inmediato segn las indicaciones.  Document Released: 10/01/2005 Document Revised: 09/20/2011 [facility] Patient Information 2012 [place], [place].      Antibiotic ear drops given to you in the ER: use 3 drops two times a day in the right ear for 7 days.

## 2011-12-29 NOTE — ED Provider Notes (Signed)
Medical screening examination/treatment/procedure(s) were performed by non-physician practitioner and as supervising physician I was immediately available for consultation/collaboration.   Alison Chambers. Oletta Lamas, MD 12/29/11 [phone]

## 2012-01-01 ENCOUNTER — Encounter: Payer: Self-pay | Admitting: Infectious Diseases

## 2012-01-01 ENCOUNTER — Telehealth: Payer: Self-pay | Admitting: *Deleted

## 2012-01-01 ENCOUNTER — Ambulatory Visit (INDEPENDENT_AMBULATORY_CARE_PROVIDER_SITE_OTHER): Payer: Self-pay | Admitting: Infectious Diseases

## 2012-01-01 VITALS — BP 132/84 | HR 92 | Temp 98.2°F | Ht 66.0 in | Wt 230.8 lb

## 2012-01-01 DIAGNOSIS — H6690 Otitis media, unspecified, unspecified ear: Secondary | ICD-10-CM | POA: Insufficient documentation

## 2012-01-01 MED ORDER — CEFIXIME 400 MG PO TABS: 400.0000 mg | ORAL_TABLET | Freq: Every day | ORAL | Status: DC

## 2012-01-01 NOTE — Telephone Encounter (Signed)
Using her dtr as an interpretor, she had gone to ED on the 15th for severe ear infection. States she feels worse now. Got & is taking the antibiotics and pain meds. Here crying & rocking in pain. She is due for another pain pill. Advised her to take it. appt made for 3:30 today & interpretor arranged for.  There was no md in the building so she could not be seen immediately . She agreed to come back

## 2012-01-01 NOTE — Assessment & Plan Note (Signed)
Will try to change her to suprax to see if this helps. Her PEN allergy was rash and swelling, itching. She will call back if not better in 1 week. Sooner if ADR. She may need ENT eval?

## 2012-01-01 NOTE — Progress Notes (Signed)
  Subjective:    Patient ID: Alison Chambers, female    DOB: 06/10/1986, 26 y.o.   MRN: [id number]  HPI 26 yo F with HIV+ Last CD4 100, VL 15,400 (July 2012). 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   Was seen in ED and was given clarithro for ear pain. Has been having ear pain for ? 2 weeks. Has been having fevers, chills. Does not feel that the biaxin has helped, states she can't hear. Denies drainage from her ears.  Previously was eval for facial swelling- this has improved from previous .    Review of Systems     Objective:   Physical Exam  Constitutional: She appears well-developed and well-nourished.  HENT:  Ears:  Mouth/Throat: No oropharyngeal exudate.  Eyes: EOM are normal. Pupils are equal, round, and reactive to light.  Neck: Neck supple.  Cardiovascular: Normal rate, regular rhythm and normal heart sounds.   Pulmonary/Chest: Effort normal and breath sounds normal.  Abdominal: Soft. Bowel sounds are normal. She exhibits no distension.  Lymphadenopathy:    She has no cervical adenopathy.          Assessment & Plan:

## 2012-01-03 ENCOUNTER — Telehealth: Payer: Self-pay | Admitting: *Deleted

## 2012-01-03 NOTE — Telephone Encounter (Signed)
Spoke to pt through interpreter service.  Ears are slowly improving.  Pain improving.  Pt taking medication.  RN advised pt to finish all medication and to all the office if symptoms worsen.  Pt verbalized understanding.

## 2012-01-04 ENCOUNTER — Emergency Department (HOSPITAL_COMMUNITY): Payer: Self-pay

## 2012-01-04 ENCOUNTER — Encounter (HOSPITAL_COMMUNITY): Payer: Self-pay

## 2012-01-04 ENCOUNTER — Inpatient Hospital Stay (HOSPITAL_COMMUNITY)
Admission: EM | Admit: 2012-01-04 | Discharge: 2012-01-07 | DRG: 134 | Disposition: A | Discharge status: Home / Self Care | Payer: Self-pay | Attending: Internal Medicine | Admitting: Internal Medicine

## 2012-01-04 DIAGNOSIS — E876 Hypokalemia: Secondary | ICD-10-CM | POA: Diagnosis present

## 2012-01-04 DIAGNOSIS — Z79899 Other long term (current) drug therapy: Secondary | ICD-10-CM

## 2012-01-04 DIAGNOSIS — Z21 Asymptomatic human immunodeficiency virus [HIV] infection status: Secondary | ICD-10-CM | POA: Diagnosis present

## 2012-01-04 DIAGNOSIS — F3289 Other specified depressive episodes: Secondary | ICD-10-CM | POA: Diagnosis present

## 2012-01-04 DIAGNOSIS — Z6836 Body mass index (BMI) 36.0-36.9, adult: Secondary | ICD-10-CM

## 2012-01-04 DIAGNOSIS — H70009 Acute mastoiditis without complications, unspecified ear: Principal | ICD-10-CM | POA: Diagnosis present

## 2012-01-04 DIAGNOSIS — B20 Human immunodeficiency virus [HIV] disease: Secondary | ICD-10-CM

## 2012-01-04 DIAGNOSIS — F329 Major depressive disorder, single episode, unspecified: Secondary | ICD-10-CM | POA: Diagnosis present

## 2012-01-04 DIAGNOSIS — H65499 Other chronic nonsuppurative otitis media, unspecified ear: Secondary | ICD-10-CM | POA: Diagnosis present

## 2012-01-04 LAB — CBC
HCT: 32.2 % — ABNORMAL LOW (ref 36.0–46.0)
Hemoglobin: 11.1 g/dL — ABNORMAL LOW (ref 12.0–15.0)
MCHC: 34.5 g/dL (ref 30.0–36.0)
Platelets: 305 10*3/uL (ref 150–400)
RBC: 4.15 MIL/uL (ref 3.87–5.11)
RBC: 3.91 MIL/uL (ref 3.87–5.11)
WBC: 3.1 10*3/uL — ABNORMAL LOW (ref 4.0–10.5)
WBC: 3.2 10*3/uL — ABNORMAL LOW (ref 4.0–10.5)

## 2012-01-04 LAB — DIFFERENTIAL
Lymphocytes Relative: 24 % (ref 12–46)
Lymphs Abs: 0.8 10*3/uL (ref 0.7–4.0)
Neutro Abs: 2.1 10*3/uL (ref 1.7–7.7)
Neutrophils Relative %: 67 % (ref 43–77)

## 2012-01-04 LAB — CREATININE, SERUM
Creatinine, Ser: 0.5 mg/dL (ref 0.50–1.10)
GFR calc Af Amer: >90 mL/min (ref >90)
GFR calc non Af Amer: >90 mL/min (ref >90)

## 2012-01-04 LAB — BASIC METABOLIC PANEL
CO2: 26 mEq/L (ref 19–32)
Glucose, Bld: 87 mg/dL (ref 70–99)
Potassium: 3.2 mEq/L — ABNORMAL LOW (ref 3.5–5.1)
Sodium: 136 mEq/L (ref 135–145)

## 2012-01-04 LAB — POCT PREGNANCY, URINE: Preg Test, Ur: NEGATIVE

## 2012-01-04 MED ORDER — IOHEXOL 300 MG/ML  SOLN
100.0000 mL | Freq: Once | INTRAMUSCULAR | Status: AC | PRN
  Administered 2012-01-04: 100 mL via INTRAVENOUS

## 2012-01-04 MED ORDER — MORPHINE SULFATE 4 MG/ML IJ SOLN
4.0000 mg | Freq: Once | INTRAMUSCULAR | Status: AC
  Administered 2012-01-04: 4 mg via INTRAVENOUS
  Filled 2012-01-04: qty 1

## 2012-01-04 MED ORDER — ONDANSETRON HCL 4 MG/2ML IJ SOLN
4.0000 mg | Freq: Four times a day (QID) | INTRAMUSCULAR | Status: DC | PRN
  Administered 2012-01-07: 4 mg via INTRAVENOUS
  Filled 2012-01-04: qty 2

## 2012-01-04 MED ORDER — VANCOMYCIN HCL IN DEXTROSE 1-5 GM/200ML-% IV SOLN
1000.0000 mg | Freq: Two times a day (BID) | INTRAVENOUS | Status: DC
  Administered 2012-01-05 – 2012-01-07 (×5): 1000 mg via INTRAVENOUS
  Filled 2012-01-04 (×6): qty 200

## 2012-01-04 MED ORDER — POTASSIUM CHLORIDE CRYS ER 20 MEQ PO TBCR
40.0000 meq | EXTENDED_RELEASE_TABLET | Freq: Once | ORAL | Status: AC
  Administered 2012-01-04: 40 meq via ORAL
  Filled 2012-01-04: qty 2

## 2012-01-04 MED ORDER — HYDROMORPHONE HCL PF 1 MG/ML IJ SOLN
INTRAMUSCULAR | Status: AC
  Administered 2012-01-04: 22:00:00
  Filled 2012-01-04: qty 2

## 2012-01-04 MED ORDER — SODIUM CHLORIDE 0.9 % IV SOLN
INTRAVENOUS | Status: AC
  Administered 2012-01-04: 17:00:00 via INTRAVENOUS

## 2012-01-04 MED ORDER — RALTEGRAVIR POTASSIUM 400 MG PO TABS
400.0000 mg | ORAL_TABLET | Freq: Two times a day (BID) | ORAL | Status: DC
  Administered 2012-01-05 – 2012-01-07 (×6): 400 mg via ORAL
  Filled 2012-01-04 (×8): qty 1

## 2012-01-04 MED ORDER — DIPHENHYDRAMINE HCL 25 MG PO CAPS
25.0000 mg | ORAL_CAPSULE | ORAL | Status: DC | PRN
  Administered 2012-01-04 – 2012-01-06 (×8): 25 mg via ORAL
  Filled 2012-01-04 (×10): qty 1

## 2012-01-04 MED ORDER — METRONIDAZOLE IN NACL 5-0.79 MG/ML-% IV SOLN
500.0000 mg | Freq: Three times a day (TID) | INTRAVENOUS | Status: DC
  Administered 2012-01-05: 500 mg via INTRAVENOUS
  Filled 2012-01-04 (×4): qty 100

## 2012-01-04 MED ORDER — METRONIDAZOLE IN NACL 5-0.79 MG/ML-% IV SOLN: 500.0000 mg | Freq: Three times a day (TID) | INTRAVENOUS | Status: DC

## 2012-01-04 MED ORDER — EMTRICITABINE-TENOFOVIR DF 200-300 MG PO TABS
1.0000 | ORAL_TABLET | Freq: Every day | ORAL | Status: DC
  Administered 2012-01-04 – 2012-01-07 (×4): 1 via ORAL
  Filled 2012-01-04 (×6): qty 1

## 2012-01-04 MED ORDER — DEXTROSE 5 % IV SOLN
2.0000 g | Freq: Three times a day (TID) | INTRAVENOUS | Status: DC
  Administered 2012-01-04 – 2012-01-05 (×2): 2 g via INTRAVENOUS
  Filled 2012-01-04 (×5): qty 2

## 2012-01-04 MED ORDER — CIPROFLOXACIN-DEXAMETHASONE 0.3-0.1 % OT SUSP
2.0000 [drp] | Freq: Four times a day (QID) | OTIC | Status: DC
  Administered 2012-01-04 – 2012-01-07 (×12): 2 [drp] via OTIC
  Filled 2012-01-04: qty 7.5

## 2012-01-04 MED ORDER — ACETAMINOPHEN 650 MG RE SUPP: 650.0000 mg | Freq: Four times a day (QID) | RECTAL | Status: DC | PRN

## 2012-01-04 MED ORDER — SULFAMETHOXAZOLE-TMP DS 800-160 MG PO TABS
1.0000 | ORAL_TABLET | ORAL | Status: DC
  Administered 2012-01-04 – 2012-01-07 (×2): 1 via ORAL
  Filled 2012-01-04 (×2): qty 1

## 2012-01-04 MED ORDER — ALUM & MAG HYDROXIDE-SIMETH 200-200-20 MG/5ML PO SUSP: 30.0000 mL | Freq: Four times a day (QID) | ORAL | Status: DC | PRN

## 2012-01-04 MED ORDER — MORPHINE SULFATE 4 MG/ML IJ SOLN
4.0000 mg | INTRAMUSCULAR | Status: DC | PRN
  Administered 2012-01-04: 4 mg via INTRAVENOUS
  Filled 2012-01-04: qty 1

## 2012-01-04 MED ORDER — SULFAMETHOXAZOLE-TMP DS 800-160 MG PO TABS
ORAL_TABLET | ORAL | Status: AC
  Administered 2012-01-04: 1 via ORAL
  Filled 2012-01-04: qty 1

## 2012-01-04 MED ORDER — DIPHENHYDRAMINE HCL 50 MG/ML IJ SOLN
25.0000 mg | Freq: Once | INTRAMUSCULAR | Status: AC
  Administered 2012-01-04: 25 mg via INTRAVENOUS

## 2012-01-04 MED ORDER — HYDROMORPHONE HCL PF 1 MG/ML IJ SOLN
1.0000 mg | INTRAMUSCULAR | Status: DC | PRN
  Administered 2012-01-05 (×2): 1 mg via INTRAVENOUS
  Administered 2012-01-05 (×2): 2 mg via INTRAVENOUS
  Administered 2012-01-06 (×2): 1 mg via INTRAVENOUS
  Filled 2012-01-04 (×3): qty 1
  Filled 2012-01-04 (×2): qty 2
  Filled 2012-01-04: qty 1

## 2012-01-04 MED ORDER — DIPHENHYDRAMINE HCL 50 MG/ML IJ SOLN
INTRAMUSCULAR | Status: AC
  Administered 2012-01-04: 25 mg via INTRAVENOUS
  Filled 2012-01-04: qty 1

## 2012-01-04 MED ORDER — HEPARIN SODIUM (PORCINE) 5000 UNIT/ML IJ SOLN
5000.0000 [IU] | Freq: Three times a day (TID) | INTRAMUSCULAR | Status: DC
  Administered 2012-01-05 – 2012-01-07 (×6): 5000 [IU] via SUBCUTANEOUS
  Filled 2012-01-04 (×11): qty 1

## 2012-01-04 MED ORDER — ACETAMINOPHEN 325 MG PO TABS: 650.0000 mg | ORAL_TABLET | Freq: Four times a day (QID) | ORAL | Status: DC | PRN

## 2012-01-04 MED ORDER — ONDANSETRON HCL 4 MG PO TABS: 4.0000 mg | ORAL_TABLET | Freq: Four times a day (QID) | ORAL | Status: DC | PRN

## 2012-01-04 MED ORDER — VANCOMYCIN HCL 1000 MG IV SOLR
2500.0000 mg | INTRAVENOUS | Status: AC
  Administered 2012-01-04: 2500 mg via INTRAVENOUS
  Filled 2012-01-04: qty 2500

## 2012-01-04 NOTE — ED Notes (Signed)
Pt states that she was recently seen for the same complaint of right ear pain. She has finished her abx of clarithromycin with no relief. Alert and oriented. Pt states that her ear pain is now bilateral and has gotten worse. She is now having trouble hearing out of both ears. She has also been using nasal spray and ear drops for pain with no relief.
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
## 2012-01-04 NOTE — ED Notes (Signed)
[id number] Ready 

## 2012-01-04 NOTE — Consult Note (Signed)
Consult Note Evaluate patient with B mastoiditis 26 yo female from the [place] who developed HIV following a blood transfusion. She has had an ear infection for over a month being treated with antibiotics as an outpatient. She has continued to have increasing ear pain and decrease hearing. CT scan in ER showed bilateral mastoiditis and MOM. EXAM  Bilateral erythematous and bulging TMs. Mild swelling of EAC skin. No significant swelling noted over the mastoid bones. Normal facial nerve function. Decreased hearing noted. Alert and oriented otherwise.  IMP:  Bilateral Acute Otitis Media with secondary mastoiditis.  REC: Will schedule patient for BMTs and cultures in AM            Broad spectrum IV antibiotics.            Ciprodex ear drops BID
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
## 2012-01-04 NOTE — ED Notes (Signed)
Pt states that she is itchy after taking the Bactrim DS, PA Chad made aware and verbal order for Benadryl given

## 2012-01-05 ENCOUNTER — Encounter (HOSPITAL_COMMUNITY)
Admission: EM | Disposition: A | Discharge status: Home / Self Care | Payer: Self-pay | Source: Home / Self Care | Attending: Internal Medicine

## 2012-01-05 ENCOUNTER — Encounter (HOSPITAL_COMMUNITY): Payer: Self-pay | Admitting: Anesthesiology

## 2012-01-05 ENCOUNTER — Inpatient Hospital Stay (HOSPITAL_COMMUNITY): Payer: Self-pay | Admitting: Anesthesiology

## 2012-01-05 LAB — COMPREHENSIVE METABOLIC PANEL
AST: 27 U/L (ref 0–37)
Albumin: 2.5 g/dL — ABNORMAL LOW (ref 3.5–5.2)
Alkaline Phosphatase: 62 U/L (ref 39–117)
BUN: 4 mg/dL — ABNORMAL LOW (ref 6–23)
Chloride: 99 mEq/L (ref 96–112)
GFR calc Af Amer: >90 mL/min (ref >90)
Glucose, Bld: 100 mg/dL — ABNORMAL HIGH (ref 70–99)
Potassium: 3.1 mEq/L — ABNORMAL LOW (ref 3.5–5.1)
Sodium: 131 mEq/L — ABNORMAL LOW (ref 135–145)
Total Bilirubin: 0.3 mg/dL (ref 0.3–1.2)
Total Protein: 9.1 g/dL — ABNORMAL HIGH (ref 6.0–8.3)

## 2012-01-05 LAB — CBC
HCT: 29.5 % — ABNORMAL LOW (ref 36.0–46.0)
MCH: 28 pg (ref 26.0–34.0)
MCHC: 33.2 g/dL (ref 30.0–36.0)
MCV: 84.3 fL (ref 78.0–100.0)
RDW: 14.3 % (ref 11.5–15.5)
WBC: 2.8 10*3/uL — ABNORMAL LOW (ref 4.0–10.5)

## 2012-01-05 SURGERY — MYRINGOTOMY WITH TUBE PLACEMENT
Anesthesia: General | Site: Ear | Laterality: Bilateral | Wound class: Clean Contaminated

## 2012-01-05 MED ORDER — MIDAZOLAM HCL 5 MG/5ML IJ SOLN
INTRAMUSCULAR | Status: DC | PRN
  Administered 2012-01-05: 2 mg via INTRAVENOUS

## 2012-01-05 MED ORDER — POTASSIUM CHLORIDE CRYS ER 20 MEQ PO TBCR
40.0000 meq | EXTENDED_RELEASE_TABLET | Freq: Two times a day (BID) | ORAL | Status: AC
  Administered 2012-01-05 (×2): 40 meq via ORAL
  Filled 2012-01-05 (×2): qty 2

## 2012-01-05 MED ORDER — SODIUM CHLORIDE 0.9 % IV SOLN: INTRAVENOUS | Status: DC

## 2012-01-05 MED ORDER — HYDROCERIN EX CREA
TOPICAL_CREAM | Freq: Two times a day (BID) | CUTANEOUS | Status: DC
  Administered 2012-01-05 – 2012-01-07 (×6): via TOPICAL
  Filled 2012-01-05: qty 113

## 2012-01-05 MED ORDER — MORPHINE SULFATE 4 MG/ML IJ SOLN: 0.0500 mg/kg | INTRAMUSCULAR | Status: DC | PRN

## 2012-01-05 MED ORDER — HYDROMORPHONE HCL PF 1 MG/ML IJ SOLN: 0.2500 mg | INTRAMUSCULAR | Status: DC | PRN

## 2012-01-05 MED ORDER — FENTANYL CITRATE 0.05 MG/ML IJ SOLN
INTRAMUSCULAR | Status: DC | PRN
  Administered 2012-01-05: 100 ug via INTRAVENOUS
  Administered 2012-01-05: 50 ug via INTRAVENOUS

## 2012-01-05 MED ORDER — DEXTROSE 5 % IV SOLN
1.0000 g | Freq: Two times a day (BID) | INTRAVENOUS | Status: DC
  Administered 2012-01-05 – 2012-01-07 (×5): 1 g via INTRAVENOUS
  Filled 2012-01-05 (×7): qty 10

## 2012-01-05 MED ORDER — ONDANSETRON HCL 4 MG/2ML IJ SOLN: 4.0000 mg | Freq: Once | INTRAMUSCULAR | Status: DC | PRN

## 2012-01-05 MED ORDER — ONDANSETRON HCL 4 MG/2ML IJ SOLN
INTRAMUSCULAR | Status: DC | PRN
  Administered 2012-01-05: 4 mg via INTRAVENOUS

## 2012-01-05 MED ORDER — LACTATED RINGERS IV SOLN
INTRAVENOUS | Status: DC | PRN
  Administered 2012-01-05: 09:00:00 via INTRAVENOUS

## 2012-01-05 MED ORDER — PROPOFOL 10 MG/ML IV EMUL
INTRAVENOUS | Status: DC | PRN
  Administered 2012-01-05: 150 mg via INTRAVENOUS

## 2012-01-05 MED ORDER — CIPROFLOXACIN-DEXAMETHASONE 0.3-0.1 % OT SUSP
OTIC | Status: DC | PRN
  Administered 2012-01-05: 4 [drp] via OTIC

## 2012-01-05 SURGICAL SUPPLY — 15 items
BALL CTTN LRG ABS STRL LF (GAUZE/BANDAGES/DRESSINGS) ×1
CANISTER SUCTION 2500CC (MISCELLANEOUS) ×2 IMPLANT
CLOTH BEACON ORANGE TIMEOUT ST (SAFETY) ×2 IMPLANT
COTTONBALL LRG STERILE PKG (GAUZE/BANDAGES/DRESSINGS) ×2 IMPLANT
COVER MAYO STAND STRL (DRAPES) ×2 IMPLANT
DRAPE PROXIMA HALF (DRAPES) ×2 IMPLANT
GLOVE SS BIOGEL STRL SZ 7.5 (GLOVE) ×1 IMPLANT
GLOVE SUPERSENSE BIOGEL SZ 7.5 (GLOVE) ×1
KIT ROOM TURNOVER OR (KITS) ×2 IMPLANT
PAD ARMBOARD 7.5X6 YLW CONV (MISCELLANEOUS) ×4 IMPLANT
TOWEL OR 17X24 6PK STRL BLUE (TOWEL DISPOSABLE) ×2 IMPLANT
TUBE CONNECTING 12X1/4 (SUCTIONS) ×2 IMPLANT
TUBE EAR SHEEHY BUTTON 1.27 (OTOLOGIC RELATED) IMPLANT
TUBE EAR T MOD 1.32X4.8 BL (OTOLOGIC RELATED) IMPLANT
TUBE EAR VENT PAPARELLA 1.02MM (OTOLOGIC RELATED) ×2 IMPLANT

## 2012-01-05 NOTE — Transfer of Care (Signed)
Immediate Anesthesia Transfer of Care Note  Patient: Alison Chambers  Procedure(s) Performed: Procedure(s) (LRB): MYRINGOTOMY WITH TUBE PLACEMENT (Bilateral)  Patient Location: PACU  Anesthesia Type: General  Level of Consciousness: awake and alert   Airway & Oxygen Therapy: Patient Spontanous Breathing and Patient connected to nasal cannula oxygen  Post-op Assessment: Report given to PACU RN and Post -op Vital signs reviewed and stable  Post vital signs: Reviewed and stable  Complications: No apparent anesthesia complications
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
## 2012-01-05 NOTE — Op Note (Signed)
NAMENILAYA, BOUIE NO.:  [id number]  MEDICAL RECORD NO.:  [id number]  LOCATION:  [facility]                         FACILITY:  [facility]  PHYSICIAN:  Kristine Garbe. Ezzard Standing, M.D.DATE OF BIRTH:  20-Apr-1986  DATE OF PROCEDURE:  01/05/2012 DATE OF DISCHARGE:                              OPERATIVE REPORT   PREOPERATIVE DIAGNOSIS:  Chronic otitis media with effusion with secondary mastoiditis.  POSTOPERATIVE DIAGNOSIS:  Chronic otitis media with effusion with secondary mastoiditis  OPERATION PERFORMED:  Bilateral myringotomy and tubes (Paparella type 1 tubes).  Culture.  SURGEON:  Kristine Garbe. Ezzard Standing, MD  ANESTHESIA:  General endotracheal.  COMPLICATIONS:  None.  BRIEF CLINICAL NOTE:  Fizza Scales is a 26 year old female who is HIV positive who has been having chronic ear infection now for over a month, not responding well to outpatient p.o. antibiotics.  She was admitted yesterday for IV antibiotics.  A CT scan showed bilateral mastoiditis as well as otitis media.  She was started on broad-spectrum IV antibiotics, and taken to the operating room at this time for BMTs for cultures and help drain the middle ear space.  DESCRIPTION OF PROCEDURE:  After adequate endotracheal anesthesia, the right ear was examined first.  The TM was bulging.  She has slight inflammatory changes of the external ear canal.  A myringotomy was made in the posterior inferior portion of the TM, more of a serous effusion was aspirated.  A Paparella type 1 tube was inserted followed by Ciprodex ear drops which was insufflated into the middle ear space.  A culture was obtained by putting a small cotton ball down in the ear to absorb the fluid and this was then placed in the culture medium. Following this, next the left ear was examined.  The left ear had more inflammatory changes at the external ear canal making limited visualization of the TM.  Again, a myringotomy was made in the  posterior portion of the TM because the anterior portion of the TM could not be visualized secondary to swelling.  Again, a mucoserous effusion was aspirated.  There was granulation tissue within the middle ear space, a small portion of this was grasped and placed in the culture medium.  A Paparella type 1 tube was inserted followed by Ciprodex ear drops which were again insufflated into the middle ear space.  This completed the procedure.  Lezlie Lye was awoken from anesthesia, and transferred to the recovery room postop doing well.  DISPOSITION:  She was transferred back to the floor.  We will continue with her broad spectrum antibiotics and Ciprodex ear drops, 4 drops per ear twice a day.          ______________________________ Kristine Garbe. Ezzard Standing, M.D.     CEN/MEDQ  D:  01/05/2012  T:  01/05/2012  Job:  [id number]

## 2012-01-05 NOTE — Anesthesia Postprocedure Evaluation (Signed)
  Anesthesia Post-op Note  Patient: Alison Chambers  Procedure(s) Performed: Procedure(s) (LRB): MYRINGOTOMY WITH TUBE PLACEMENT (Bilateral)  Patient Location: PACU  Anesthesia Type: General  Level of Consciousness: awake, alert  and oriented  Airway and Oxygen Therapy: Patient Spontanous Breathing and Patient connected to nasal cannula oxygen  Post-op Pain: none  Post-op Assessment: Post-op Vital signs reviewed  Post-op Vital Signs: Reviewed  Complications: No apparent anesthesia complications

## 2012-01-05 NOTE — Anesthesia Preprocedure Evaluation (Addendum)
Anesthesia Evaluation  Patient identified by MRN, date of birth, ID band Patient awake    Reviewed: Allergy & Precautions, H&P , NPO status , Patient's Chart, lab work & pertinent test results  Airway Mallampati: II  Neck ROM: Full  Mouth opening: Limited Mouth Opening  Dental  (+) Teeth Intact and Dental Advisory Given   Pulmonary  breath sounds clear to auscultation        Cardiovascular Rhythm:Regular Rate:Normal     Neuro/Psych    GI/Hepatic   Endo/Other  Morbid obesity  Renal/GU      Musculoskeletal   Abdominal   Peds  Hematology  (+) HIV,   Anesthesia Other Findings   Reproductive/Obstetrics                         Anesthesia Physical Anesthesia Plan  ASA: III  Anesthesia Plan: General   Post-op Pain Management:    Induction: Intravenous  Airway Management Planned: LMA  Additional Equipment:   Intra-op Plan:   Post-operative Plan: Extubation in OR  Informed Consent: I have reviewed the patients History and Physical, chart, labs and discussed the procedure including the risks, benefits and alternatives for the proposed anesthesia with the patient or authorized representative who has indicated his/her understanding and acceptance.   Dental advisory given  Plan Discussed with: CRNA, Anesthesiologist and Surgeon  Anesthesia Plan Comments:         Anesthesia Quick Evaluation
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
## 2012-01-05 NOTE — H&P (Signed)
On-Call Internal Medicine Attending Admission Note Date: 01/05/2012  Patient name: Alison Chambers Medical record number: [id number] Date of birth: 09/16/1986 Age: 26 y.o. Gender: female  I saw and evaluated the patient. I reviewed the resident's note and I agree with the resident's findings and plan as documented in the resident's note, with additional comments as noted below.  Chief Complaint(s): Bilateral ear pain; hearing loss.  History - key components related to admission: Patient is a 26 year old woman with a history of HIV, depression, and other problems as outlined medical history, admitted with a one-month history of bilateral ear pain which did not improve with a course of clarithromycin followed by recently prescribed Suprax, and also with recent hearing loss.   Physical Exam - key components related to admission:  Filed Vitals:   01/05/12 1015 01/05/12 1016 01/05/12 1017 01/05/12 1032  BP:  116/70  117/73  Pulse: 93  89 82  Temp: 98.1 F (36.7 C)   99.4 F (37.4 C)  TempSrc:      Resp: 21  20 18   Height:      Weight:      SpO2: 100%  100% 98%   Gen.: Alert, no distress HEENT: Cotton packing in ear canals bilaterally. Lungs: Clear Heart: Regular; S1-S2, no S3, no S4, no murmurs Abdomen: Bowel sounds present, soft, nontender Extremities: No edema   Lab results:   Basic Metabolic Panel:  Basename 01/05/12 0600 01/04/12 1732 01/04/12 1004  NA 131* -- 136  K 3.1* -- 3.2*  CL 99 -- 102  CO2 24 -- 26  GLUCOSE 100* -- 87  BUN 4* -- 6  CREATININE 0.68 0.50 --  CALCIUM 8.2* -- 8.9  MG -- -- --  PHOS -- -- --   Liver Function Tests:  Basename 01/05/12 0600  AST 27  ALT 35  ALKPHOS 62  BILITOT 0.3  PROT 9.1*  ALBUMIN 2.5*     CBC:  Basename 01/05/12 0600 01/04/12 1732 01/04/12 1004  WBC 2.8* 3.2* --  NEUTROABS -- -- 2.1  HGB 9.8* 11.1* --  HCT 29.5* 32.2* --  MCV 84.3 82.4 --  PLT 271 294 --    CD4 T Cell Abs  Date Value Range Status    10/26/2011 90* [phone] (cmm) Final     HIV 1 RNA Quant  Date Value Range Status  10/26/2011 21799* <20 (copies/mL) Final     Imaging results:  Dg Chest 2 View  01/04/2012  *RADIOLOGY REPORT*  Clinical Data: Cough and congestion  CHEST - 2 VIEW  Comparison: 10/11/2011  Findings: Normal heart size.  Clear lungs.  No pleural effusion. No pneumothorax.  IMPRESSION: No active cardiopulmonary disease.  Original Report Authenticated By: Donavan Burnet, M.D.   Ct Temporal Bones W/cm  01/04/2012  *RADIOLOGY REPORT*  Clinical Data: 26 year old female with pain behind the right year, fever, tender to palpation.  HIV positive, CD-4 count equals 90.  CT TEMPORAL BONES WITH CONTRAST  Technique:  Axial and coronal plane CT imaging of the petrous temporal bones was performed with thin-collimation image reconstruction after intravenous contrast administration. Multiplanar CT image reconstructions were also generated.  Contrast:  100 ml Omnipaque-300.  Comparison: Neck CTs 05/29/2011 and earlier.  Head CT 11/29/2008.  Findings: Negative visualized brain parenchyma.  Major vascular structures including the right sigmoid sinus and IJ bulb are enhancing and appear to be patent. Visualized orbit soft tissues are within normal limits.  Adenoid hypertrophy.  Polypoid mucosal thickening in the left maxillary sinus.  Bubbly opacity in the left sphenoid sinus.  Mild paranasal sinus mucosal thickening elsewhere.  Left temporal bone:  Soft tissue thickening along the external auditory canal.  The left tympanic cavity is completely opacified. The ossicles appear to remain intact and normally aligned.  The mastoids are completely opacified.  No mastoid septation erosion or sigmoid plate dehiscence. Left IAC, cochlea, vestibule, semicircular canals and vestibular aqueduct are within normal limits.  Right temporal bone:  Right EAC soft tissue thickening similar to that on the left.  Opacified right tympanic cavity.  Right ossicles  appear remain intact and normally aligned.  Right mastoid completely opacified.  No mastoid septation erosion or sigmoid plate dehiscence identified. Right IAC, cochlea, vestibule, semicircular canals, and vestibular aqueduct are within normal limits.  IMPRESSION: 1.  Bilateral EAC soft tissue thickening and diffuse middle ear and mastoid opacification. The appearance is nonspecific and could simply reflect bilateral eustachian tube obstruction, but cannot exclude infectious otomastoiditis in this clinical setting. It is otitis externa present clinically? 2.  No associated bony destruction identified.  Major vascular structures including the sigmoid sinuses and IJ bulbs appear remain patent. 3.  Adenoid hypertrophy, could be physiologic in this age or related to lymphoproliferative disorder/HIV in this setting. 4.  Mild to moderate paranasal sinus inflammatory changes.  Original Report Authenticated By: Harley Hallmark, M.D.    Assessment & Plan by Problem:  1.  Chronic otitis media with mastoiditis.  Patient underwent bilateral myringotomy and tube placement today by Dr. Dillard Cannon, and is currently alert and resting without complaint following surgery.  I discussed patient in detail with ID consult with Dr. [name].  Plan is to continue vancomycin for now pending Gram stain and culture results, start ceftriaxone, and stop cefazolin/metronidazole; continue Ciprodex ear drops as per Dr. Ezzard Standing; follow Gram stain and culture.  2.  HIV.  Continue home antiretroviral regimen.  3.  Hypokalemia.  Replace and follow.  4.  Attending physician.  Dr. Rogelia Boga will return as attending physician on Monday, 01/07/2012

## 2012-01-05 NOTE — Progress Notes (Signed)
Resident md  made aware of vaginal itching. Patient is on menses also. No orders at this time.

## 2012-01-05 NOTE — Progress Notes (Signed)
Subjective: Pt is doing much better this morning. She is not having ear pain at this time. She is having some dry skin at the site of her skin graft on left leg. Headache is gone. Drains are in place in her ear with cotton balls for drainage.   Objective: Vital signs in last 24 hours: Filed Vitals:   01/05/12 1015 01/05/12 1016 01/05/12 1017 01/05/12 1032  BP:  116/70  117/73  Pulse: 93  89 82  Temp: 98.1 F (36.7 C)   99.4 F (37.4 C)  TempSrc:      Resp: 21  20 18   Height:      Weight:      SpO2: 100%  100% 98%   Weight change:   Intake/Output Summary (Last 24 hours) at 01/05/12 1157 Last data filed at 01/05/12 0945  Gross per 24 hour  Intake 2704.17 ml  Output      0 ml  Net 2704.17 ml   Physical Exam: General: resting in bed, with her husband by her side HEENT: PERRL, EOMI, no scleral icterus, cotton balls in ear, did not remove as she is post op from tube placement procedure this am  Cardiac: RRR, no rubs, murmurs or gallops  Pulm: clear to auscultation bilaterally, moving normal volumes of air  Abd: soft, nontender, nondistended, BS present  Ext: warm and well perfused, no pedal edema, left leg has well healed area of injury  Neuro: alert and oriented X3, cranial nerves II-XII grossly intact  Lab Results: Basic Metabolic Panel:  Lab 01/05/12 1610 01/04/12 1732 01/04/12 1004  NA 131* -- 136  K 3.1* -- 3.2*  CL 99 -- 102  CO2 24 -- 26  GLUCOSE 100* -- 87  BUN 4* -- 6  CREATININE 0.68 0.50 --  CALCIUM 8.2* -- 8.9  MG -- -- --  PHOS -- -- --   Liver Function Tests:  Lab 01/05/12 0600  AST 27  ALT 35  ALKPHOS 62  BILITOT 0.3  PROT 9.1*  ALBUMIN 2.5*   CBC:  Lab 01/05/12 0600 01/04/12 1732 01/04/12 1004  WBC 2.8* 3.2* --  NEUTROABS -- -- 2.1  HGB 9.8* 11.1* --  HCT 29.5* 32.2* --  MCV 84.3 82.4 --  PLT 271 294 --   Studies/Results: Dg Chest 2 View  01/04/2012  *RADIOLOGY REPORT*  Clinical Data: Cough and congestion  CHEST - 2 VIEW  Comparison:  10/11/2011  Findings: Normal heart size.  Clear lungs.  No pleural effusion. No pneumothorax.  IMPRESSION: No active cardiopulmonary disease.  Original Report Authenticated By: Donavan Burnet, M.D.   Ct Temporal Bones W/cm  01/04/2012  *RADIOLOGY REPORT*  Clinical Data: 26 year old female with pain behind the right year, fever, tender to palpation.  HIV positive, CD-4 count equals 90.  CT TEMPORAL BONES WITH CONTRAST  Technique:  Axial and coronal plane CT imaging of the petrous temporal bones was performed with thin-collimation image reconstruction after intravenous contrast administration. Multiplanar CT image reconstructions were also generated.  Contrast:  100 ml Omnipaque-300.  Comparison: Neck CTs 05/29/2011 and earlier.  Head CT 11/29/2008.  Findings: Negative visualized brain parenchyma.  Major vascular structures including the right sigmoid sinus and IJ bulb are enhancing and appear to be patent. Visualized orbit soft tissues are within normal limits.  Adenoid hypertrophy.  Polypoid mucosal thickening in the left maxillary sinus.  Bubbly opacity in the left sphenoid sinus.  Mild paranasal sinus mucosal thickening elsewhere.  Left temporal bone:  Soft tissue thickening  along the external auditory canal.  The left tympanic cavity is completely opacified. The ossicles appear to remain intact and normally aligned.  The mastoids are completely opacified.  No mastoid septation erosion or sigmoid plate dehiscence. Left IAC, cochlea, vestibule, semicircular canals and vestibular aqueduct are within normal limits.  Right temporal bone:  Right EAC soft tissue thickening similar to that on the left.  Opacified right tympanic cavity.  Right ossicles appear remain intact and normally aligned.  Right mastoid completely opacified.  No mastoid septation erosion or sigmoid plate dehiscence identified. Right IAC, cochlea, vestibule, semicircular canals, and vestibular aqueduct are within normal limits.  IMPRESSION: 1.   Bilateral EAC soft tissue thickening and diffuse middle ear and mastoid opacification. The appearance is nonspecific and could simply reflect bilateral eustachian tube obstruction, but cannot exclude infectious otomastoiditis in this clinical setting. It is otitis externa present clinically? 2.  No associated bony destruction identified.  Major vascular structures including the sigmoid sinuses and IJ bulbs appear remain patent. 3.  Adenoid hypertrophy, could be physiologic in this age or related to lymphoproliferative disorder/HIV in this setting. 4.  Mild to moderate paranasal sinus inflammatory changes.  Original Report Authenticated By: Harley Hallmark, M.D.   Medications: I have reviewed the patient's current medications. Scheduled Meds:   . cefTRIAXone (ROCEPHIN)  IV  1 g Intravenous Q12H  . ciprofloxacin-dexamethasone  2 drop Both Ears Q6H  . diphenhydrAMINE  25 mg Intravenous Once  . emtricitabine-tenofovir  1 tablet Oral Daily  . heparin  5,000 Units Subcutaneous Q8H  . hydrocerin   Topical BID  . HYDROmorphone      .  morphine injection  4 mg Intravenous Once  . potassium chloride  40 mEq Oral Once  . potassium chloride  40 mEq Oral BID  . raltegravir  400 mg Oral BID  . sulfamethoxazole-trimethoprim  1 tablet Oral 3 times weekly  . vancomycin  2,500 mg Intravenous To Major  . vancomycin  1,000 mg Intravenous Q12H  . DISCONTD: cefTAZidime (FORTAZ)  IV  2 g Intravenous Q8H  . DISCONTD: metronidazole  500 mg Intravenous Q8H  . DISCONTD: metronidazole  500 mg Intravenous Q8H   Continuous Infusions:   . sodium chloride 125 mL/hr at 01/04/12 1717  . DISCONTD: sodium chloride     PRN Meds:.acetaminophen, acetaminophen, alum & mag hydroxide-simeth, diphenhydrAMINE, HYDROmorphone (DILAUDID) injection, ondansetron (ZOFRAN) IV, ondansetron, DISCONTD: ciprofloxacin-dexamethasone, DISCONTD: HYDROmorphone, DISCONTD: morphine, DISCONTD: morphine, DISCONTD: ondansetron (ZOFRAN)  IV  Assessment/Plan:   Acute mastoiditis - Will use vancomycin and ceftriaxone per ID curbside. ENT did surgery this AM to put tubes in place for drainage. Will defer to them on management of those tubes. Continuing to get otic drops. Will watch overnight. Will give regular diet and saline lock IV.   Hypokalemia - Will give two dose doses of Kdur 40 mEq today and recheck in the AM. Likely due to poor PO intake prior to arrival. If still low tomorrow will check magnesium level.   HIV - Will continue home medications and bactrim for ppx for CD4 count of 90.  Leg healed wound with itching - will give eucerin cream and leave it in the room to be applied as needed.   DVT ppx - heparin  Disposition - Will see how she is doing tomorrow and ENT plan. Will advance diet today and watch on IV antibiotics.   LOS: 1 day   Genella Mech 01/05/2012, 11:57 AM

## 2012-01-05 NOTE — Brief Op Note (Signed)
01/04/2012 - 01/05/2012  9:41 AM  PATIENT:  Alison Chambers  26 y.o. female  PRE-OPERATIVE DIAGNOSIS:  Fluid  POST-OPERATIVE DIAGNOSIS:  chronic otitis media  PROCEDURE:  Procedure(s) (LRB): MYRINGOTOMY WITH TUBE PLACEMENT (Bilateral)  SURGEON:  Surgeon(s) and Role:    * Drema Halon, MD - Primary  PHYSICIAN ASSISTANT:   ASSISTANTS: none   ANESTHESIA:   general  EBL:     BLOOD ADMINISTERED:none  DRAINS: none   LOCAL MEDICATIONS USED:  NONE  SPECIMEN:  No Specimen  DISPOSITION OF SPECIMEN:  PATHOLOGY  COUNTS:  YES  TOURNIQUET:  * No tourniquets in log *  DICTATION: .Other Dictation: Dictation Number [id number]  PLAN OF CARE: Admit to inpatient   PATIENT DISPOSITION:  PACU - hemodynamically stable.   Delay start of Pharmacological VTE agent (>24hrs) due to surgical blood loss or risk of bleeding: not applicable

## 2012-01-06 LAB — CBC
HCT: 30.9 % — ABNORMAL LOW (ref 36.0–46.0)
Hemoglobin: 10.2 g/dL — ABNORMAL LOW (ref 12.0–15.0)
MCV: 84 fL (ref 78.0–100.0)
RBC: 3.68 MIL/uL — ABNORMAL LOW (ref 3.87–5.11)
WBC: 1.8 10*3/uL — ABNORMAL LOW (ref 4.0–10.5)

## 2012-01-06 LAB — MAGNESIUM: Magnesium: 2 mg/dL (ref 1.5–2.5)

## 2012-01-06 MED ORDER — POTASSIUM CHLORIDE CRYS ER 20 MEQ PO TBCR
40.0000 meq | EXTENDED_RELEASE_TABLET | Freq: Two times a day (BID) | ORAL | Status: AC
  Administered 2012-01-06 (×2): 40 meq via ORAL
  Filled 2012-01-06 (×2): qty 2

## 2012-01-06 MED ORDER — DIPHENHYDRAMINE HCL 25 MG PO CAPS
25.0000 mg | ORAL_CAPSULE | Freq: Once | ORAL | Status: AC
  Administered 2012-01-06: 25 mg via ORAL

## 2012-01-06 MED ORDER — HYDROCODONE-ACETAMINOPHEN 5-325 MG PO TABS
1.0000 | ORAL_TABLET | ORAL | Status: DC | PRN
  Administered 2012-01-06 – 2012-01-07 (×4): 2 via ORAL
  Filled 2012-01-06 (×4): qty 2

## 2012-01-06 NOTE — Progress Notes (Signed)
0115 Pt c/o severe itching after benadryl and dilaudid give at 0015. Dr Bosie Clos notified orders received. Benadryl 25mg  given po at 0146. 0300 Pt resting at present will continue to monitor

## 2012-01-06 NOTE — Progress Notes (Signed)
Subjective: Pt is doing much better this AM. Sitting comfortably and watching TV. She was wondering how long she needs tubes in her ears.   Objective: Vital signs in last 24 hours: Filed Vitals:   01/05/12 1032 01/05/12 1800 01/05/12 2234 01/06/12 0514  BP: 117/73 130/75 125/83 117/68  Pulse: 82 81 85 73  Temp: 99.4 F (37.4 C) 98.7 F (37.1 C) 97.7 F (36.5 C) 97.7 F (36.5 C)  TempSrc:   Oral   Resp: 18 18 19 19   Height:      Weight:      SpO2: 98% 98% 98% 100%   Weight change:   Intake/Output Summary (Last 24 hours) at 01/06/12 0913 Last data filed at 01/06/12 1308  Gross per 24 hour  Intake   2150 ml  Output   2900 ml  Net   -750 ml   Physical Exam: General: resting in bed, with her husband by her side HEENT: PERRL, EOMI, no scleral icterus, cotton balls in ear, did not remove as she is post op from tube placement procedure this am  Cardiac: RRR, no rubs, murmurs or gallops  Pulm: clear to auscultation bilaterally, moving normal volumes of air  Abd: soft, nontender, nondistended, BS present  Ext: warm and well perfused, no pedal edema, left leg has well healed area of injury  Neuro: alert and oriented X3, cranial nerves II-XII grossly intact  Lab Results: Basic Metabolic Panel:  Lab 01/05/12 6578 01/04/12 1732 01/04/12 1004  NA 131* -- 136  K 3.1* -- 3.2*  CL 99 -- 102  CO2 24 -- 26  GLUCOSE 100* -- 87  BUN 4* -- 6  CREATININE 0.68 0.50 --  CALCIUM 8.2* -- 8.9  MG -- -- --  PHOS -- -- --   Liver Function Tests:  Lab 01/05/12 0600  AST 27  ALT 35  ALKPHOS 62  BILITOT 0.3  PROT 9.1*  ALBUMIN 2.5*   CBC:  Lab 01/06/12 0820 01/05/12 0600 01/04/12 1004  WBC 1.8* 2.8* --  NEUTROABS -- -- 2.1  HGB 10.2* 9.8* --  HCT 30.9* 29.5* --  MCV 84.0 84.3 --  PLT 268 271 --   Studies/Results: Dg Chest 2 View  01/04/2012  *RADIOLOGY REPORT*  Clinical Data: Cough and congestion  CHEST - 2 VIEW  Comparison: 10/11/2011  Findings: Normal heart size.  Clear  lungs.  No pleural effusion. No pneumothorax.  IMPRESSION: No active cardiopulmonary disease.  Original Report Authenticated By: Donavan Burnet, M.D.   Ct Temporal Bones W/cm  01/04/2012  *RADIOLOGY REPORT*  Clinical Data: 26 year old female with pain behind the right year, fever, tender to palpation.  HIV positive, CD-4 count equals 90.  CT TEMPORAL BONES WITH CONTRAST  Technique:  Axial and coronal plane CT imaging of the petrous temporal bones was performed with thin-collimation image reconstruction after intravenous contrast administration. Multiplanar CT image reconstructions were also generated.  Contrast:  100 ml Omnipaque-300.  Comparison: Neck CTs 05/29/2011 and earlier.  Head CT 11/29/2008.  Findings: Negative visualized brain parenchyma.  Major vascular structures including the right sigmoid sinus and IJ bulb are enhancing and appear to be patent. Visualized orbit soft tissues are within normal limits.  Adenoid hypertrophy.  Polypoid mucosal thickening in the left maxillary sinus.  Bubbly opacity in the left sphenoid sinus.  Mild paranasal sinus mucosal thickening elsewhere.  Left temporal bone:  Soft tissue thickening along the external auditory canal.  The left tympanic cavity is completely opacified. The ossicles appear to  remain intact and normally aligned.  The mastoids are completely opacified.  No mastoid septation erosion or sigmoid plate dehiscence. Left IAC, cochlea, vestibule, semicircular canals and vestibular aqueduct are within normal limits.  Right temporal bone:  Right EAC soft tissue thickening similar to that on the left.  Opacified right tympanic cavity.  Right ossicles appear remain intact and normally aligned.  Right mastoid completely opacified.  No mastoid septation erosion or sigmoid plate dehiscence identified. Right IAC, cochlea, vestibule, semicircular canals, and vestibular aqueduct are within normal limits.  IMPRESSION: 1.  Bilateral EAC soft tissue thickening and diffuse  middle ear and mastoid opacification. The appearance is nonspecific and could simply reflect bilateral eustachian tube obstruction, but cannot exclude infectious otomastoiditis in this clinical setting. It is otitis externa present clinically? 2.  No associated bony destruction identified.  Major vascular structures including the sigmoid sinuses and IJ bulbs appear remain patent. 3.  Adenoid hypertrophy, could be physiologic in this age or related to lymphoproliferative disorder/HIV in this setting. 4.  Mild to moderate paranasal sinus inflammatory changes.  Original Report Authenticated By: Harley Hallmark, M.D.   Medications: I have reviewed the patient's current medications. Scheduled Meds:    . cefTRIAXone (ROCEPHIN)  IV  1 g Intravenous Q12H  . ciprofloxacin-dexamethasone  2 drop Both Ears Q6H  . diphenhydrAMINE  25 mg Oral Once  . emtricitabine-tenofovir  1 tablet Oral Daily  . heparin  5,000 Units Subcutaneous Q8H  . hydrocerin   Topical BID  . potassium chloride  40 mEq Oral BID  . raltegravir  400 mg Oral BID  . sulfamethoxazole-trimethoprim  1 tablet Oral 3 times weekly  . vancomycin  1,000 mg Intravenous Q12H  . DISCONTD: cefTAZidime (FORTAZ)  IV  2 g Intravenous Q8H  . DISCONTD: metronidazole  500 mg Intravenous Q8H   Continuous Infusions:    . DISCONTD: sodium chloride     PRN Meds:.acetaminophen, acetaminophen, alum & mag hydroxide-simeth, diphenhydrAMINE, HYDROcodone-acetaminophen, HYDROmorphone (DILAUDID) injection, ondansetron (ZOFRAN) IV, ondansetron, DISCONTD: ciprofloxacin-dexamethasone, DISCONTD: HYDROmorphone, DISCONTD: morphine, DISCONTD: ondansetron (ZOFRAN) IV  Assessment/Plan:  1. Bilateral  Chronic otitis media with secondary mastoiditis - s/p Myringotomy and tubes on 3/23 by Dr. Ezzard Standing.Patient states that she feels better today with decreased pain and some improvement in her hearing loss. Appreciate ENT inputs! -Continue  vancomycin and ceftriaxone for now  until the ear cultures results are obtained.  -Continuing to ciprofloxacin drops.   For chronic otitis media, uptodate recommends broad coverage for staph, pseudomonas, strep pneumo, H flu. We may consider d/c her home on cipro and doxy.  2.Hypokalemia - check Mg levels. Replete.   3. HIV - Will continue home medications and bactrim for ppx for CD4 count of 90.  4.Leg healed wound with itching - will give eucerin cream and leave it in the room to be applied as needed.   5. DVT ppx - heparin  6. Disposition - Pending until further  ENT recs.  LOS: 2 days   Arliss Frisina 01/06/2012, 9:13 AM

## 2012-01-07 ENCOUNTER — Telehealth: Payer: Self-pay | Admitting: Internal Medicine

## 2012-01-07 MED ORDER — NEOMYCIN-POLYMYXIN-HC 3.5-10000-1 OT SUSP: 3.0000 [drp] | Freq: Four times a day (QID) | OTIC | Status: AC

## 2012-01-07 MED ORDER — CIPROFLOXACIN-DEXAMETHASONE 0.3-0.1 % OT SUSP: 2.0000 [drp] | Freq: Four times a day (QID) | OTIC | Status: DC

## 2012-01-07 MED ORDER — DOXYCYCLINE MONOHYDRATE 100 MG PO CAPS: 100.0000 mg | ORAL_CAPSULE | Freq: Two times a day (BID) | ORAL | Status: AC

## 2012-01-07 MED ORDER — DIPHENHYDRAMINE HCL 25 MG PO CAPS: 25.0000 mg | ORAL_CAPSULE | Freq: Once | ORAL | Status: DC

## 2012-01-07 MED ORDER — CIPROFLOXACIN HCL 500 MG PO TABS: 500.0000 mg | ORAL_TABLET | Freq: Two times a day (BID) | ORAL | Status: AC

## 2012-01-07 NOTE — Progress Notes (Signed)
Subjective: Pt is doing much better this morning. She is not having ear pain at this time. Cotton balls in her ears, states that her hearing is improving.   Objective: Vital signs in last 24 hours: Filed Vitals:   01/06/12 1346 01/06/12 1811 01/06/12 2212 01/07/12 0603  BP: 113/84 108/62 107/65 127/65  Pulse: 83 75 62 63  Temp: 98.7 F (37.1 C) 98.7 F (37.1 C) 98 F (36.7 C) 98.3 F (36.8 C)  TempSrc: Oral Oral Oral   Resp: 17 16 18 18   Height:      Weight:      SpO2: 100% 100% 99% 99%   Weight change:   Intake/Output Summary (Last 24 hours) at 01/07/12 1027 Last data filed at 01/07/12 0605  Gross per 24 hour  Intake    940 ml  Output   2800 ml  Net  -1860 ml   Physical Exam: General: resting in bed, with her husband by her side HEENT: PERRL, EOMI, no scleral icterus, cotton balls in ear  Cardiac: RRR, no rubs, murmurs or gallops  Pulm: clear to auscultation bilaterally, moving normal volumes of air  Abd: soft, nontender, nondistended, BS present  Ext: warm and well perfused, no pedal edema, left leg has well healed area of injury  Neuro: alert and oriented X3, cranial nerves II-XII grossly intact  Lab Results: Basic Metabolic Panel:  Lab 01/06/12 1610 01/05/12 0600 01/04/12 1732 01/04/12 1004  NA -- 131* -- 136  K -- 3.1* -- 3.2*  CL -- 99 -- 102  CO2 -- 24 -- 26  GLUCOSE -- 100* -- 87  BUN -- 4* -- 6  CREATININE -- 0.68 0.50 --  CALCIUM -- 8.2* -- 8.9  MG 2.0 -- -- --  PHOS -- -- -- --   Liver Function Tests:  Lab 01/05/12 0600  AST 27  ALT 35  ALKPHOS 62  BILITOT 0.3  PROT 9.1*  ALBUMIN 2.5*   CBC:  Lab 01/06/12 0820 01/05/12 0600 01/04/12 1004  WBC 1.8* 2.8* --  NEUTROABS -- -- 2.1  HGB 10.2* 9.8* --  HCT 30.9* 29.5* --  MCV 84.0 84.3 --  PLT 268 271 --   Studies/Results: No results found. Medications: I have reviewed the patient's current medications. Scheduled Meds:    . cefTRIAXone (ROCEPHIN)  IV  1 g Intravenous Q12H  .  ciprofloxacin-dexamethasone  2 drop Both Ears Q6H  . diphenhydrAMINE  25 mg Oral Once  . emtricitabine-tenofovir  1 tablet Oral Daily  . heparin  5,000 Units Subcutaneous Q8H  . hydrocerin   Topical BID  . potassium chloride  40 mEq Oral BID  . raltegravir  400 mg Oral BID  . sulfamethoxazole-trimethoprim  1 tablet Oral 3 times weekly  . vancomycin  1,000 mg Intravenous Q12H   Continuous Infusions:  PRN Meds:.acetaminophen, acetaminophen, alum & mag hydroxide-simeth, diphenhydrAMINE, HYDROcodone-acetaminophen, HYDROmorphone (DILAUDID) injection, ondansetron (ZOFRAN) IV, ondansetron  Assessment/Plan:   Acute mastoiditis - Doing well. Will discharge home today with ear drops for 1 week, oral antibiotics for 3 weeks and F/U with Dr. Ezzard Standing (ENT) in 1 week.    Hypokalemia - Did replete and magnesium level was normal. Likely due to poor PO intake from ear pain for several days prior to admission and NPO status in hospital.   HIV - Will continue home medications and bactrim for ppx for CD4 count of 90.  Leg healed wound with itching - will give eucerin cream and leave it in the room to  be applied as needed.   DVT ppx - heparin  Disposition - Home today with cipro and doxy oral and cipro drops for her ears with ENT follow up.   LOS: 3 days   Genella Mech 01/07/2012, 10:27 AM

## 2012-01-07 NOTE — Discharge Instructions (Signed)
You were seen for an infection in your ear and tubes were put in both ears. These will help to drain the infection and will need to be in for at least 3 months, but they will be left in as they will fall out on their own in about 8 months. You will need the ear drops for 1 week. When you go home you will also have oral medicines for the infection for 3 weeks. There are two called doxycycline and ciprofloxacin. Both need to be taken twice a day. Go back to see the ear doctor in 1 week.   Usted fue visto por una infeccin en el odo y los tubos se colocaron en ambos odos. Esto ayudar a Government social research officer infeccin y tendr Advertising account planner en por lo menos durante 3 meses, pero se dej en a medida que se caen por s mismos en aproximadamente 8 meses. Usted necesitar el odo gotas durante 1 semana. Cuando vuelva a casa tambin tendr [employer] para la infeccin durante 3 semanas. Hay dos llamado doxiciclina y ciprofloxacina. Ambos necesitan tomar [employer].  Volver a ver al doctor del odo en 1 semana.

## 2012-01-07 NOTE — Progress Notes (Signed)
Discharge instructions reviewed with pt using interpreter.  Pt verbalized understanding and questions answered.  Pt discharged in stable condition with family.  Alison Chambers

## 2012-01-07 NOTE — Discharge Summary (Signed)
Internal Medicine Teaching [facility] Discharge Note  Name: Alison Chambers MRN: [id number] DOB: 03/04/1986 26 y.o.  Date of Admission: 01/04/2012  9:12 AM Date of Discharge: 01/07/2012 Attending Physician: Burns Spain, MD  Discharge Diagnosis: Active Problems:  Acute mastoiditis HIV +  Discharge Medications: Medication List  As of 01/07/2012 10:30 AM   STOP taking these medications         cefixime 400 MG tablet         TAKE these medications         ciprofloxacin-dexamethasone otic suspension   Commonly known as: CIPRODEX   Place 2 drops into both ears every 6 (six) hours. For 1 week.      doxycycline 100 MG capsule   Commonly known as: MONODOX   Take 1 capsule (100 mg total) by mouth 2 (two) times daily.      emtricitabine-tenofovir 200-300 MG per tablet   Commonly known as: TRUVADA   Take 1 tablet by mouth daily.      oxyCODONE-acetaminophen 5-325 MG per tablet   Commonly known as: PERCOCET   Take 1 tablet by mouth every 4 (four) hours as needed. pain      raltegravir 400 MG tablet   Commonly known as: ISENTRESS   Take 400 mg by mouth 2 (two) times daily.      sulfamethoxazole-trimethoprim 800-160 MG per tablet   Commonly known as: BACTRIM DS,SEPTRA DS   Take 1 tablet by mouth 2 (two) times daily. On course since 3/19            Disposition and follow-up:   Ms.Lorry Touchette was discharged from [facility] in Good condition.    Follow-up Appointments: Follow-up Information    Follow up with Johny Sax, MD. Call on 01/07/2012. (As needed for an appointment.)    Contact information:   [address]  [address] [place] [address] [phone]       Follow up with Dillard Cannon, MD on 01/14/2012. (At 2:00 PM for an appointment.)    Contact information:   Individual [address] [place] [address] [phone]          Discharge Orders    Future Appointments: Provider: Department: Dept Phone: Center:   01/14/2012 9:15 AM [facility] [phone] [facility]   01/28/2012 9:15 AM [name], MD [facility] [phone] [facility]      Consultations:    Procedures Performed:  Dg Chest 2 View  01/04/2012  *RADIOLOGY REPORT*  Clinical Data: Cough and congestion  CHEST - 2 VIEW  Comparison: 10/11/2011  Findings: Normal heart size.  Clear lungs.  No pleural effusion. No pneumothorax.  IMPRESSION: No active cardiopulmonary disease.  Original Report Authenticated By: Donavan Burnet, M.D.   Ct Temporal Bones W/cm  01/04/2012  *RADIOLOGY REPORT*  Clinical Data: 26 year old female with pain behind the right year, fever, tender to palpation.  HIV positive, CD-4 count equals 90.  CT TEMPORAL BONES WITH CONTRAST  Technique:  Axial and coronal plane CT imaging of the petrous temporal bones was performed with thin-collimation image reconstruction after intravenous contrast administration. Multiplanar CT image reconstructions were also generated.  Contrast:  100 ml Omnipaque-300.  Comparison: Neck CTs 05/29/2011 and earlier.  Head CT 11/29/2008.  Findings: Negative visualized brain parenchyma.  Major vascular structures including the right sigmoid sinus and IJ bulb are enhancing and appear to be patent.  Visualized orbit soft tissues are within normal limits.  Adenoid hypertrophy.  Polypoid mucosal thickening in the left maxillary sinus.  Bubbly opacity in the left sphenoid sinus.  Mild paranasal sinus mucosal thickening elsewhere.  Left temporal bone:  Soft tissue thickening along the external auditory canal.  The left tympanic cavity is completely opacified. The ossicles appear to remain intact and normally aligned.  The mastoids are completely opacified.  No mastoid septation erosion or sigmoid plate dehiscence. Left IAC, cochlea, vestibule, semicircular canals and vestibular aqueduct are within normal  limits.  Right temporal bone:  Right EAC soft tissue thickening similar to that on the left.  Opacified right tympanic cavity.  Right ossicles appear remain intact and normally aligned.  Right mastoid completely opacified.  No mastoid septation erosion or sigmoid plate dehiscence identified. Right IAC, cochlea, vestibule, semicircular canals, and vestibular aqueduct are within normal limits.  IMPRESSION: 1.  Bilateral EAC soft tissue thickening and diffuse middle ear and mastoid opacification. The appearance is nonspecific and could simply reflect bilateral eustachian tube obstruction, but cannot exclude infectious otomastoiditis in this clinical setting. It is otitis externa present clinically? 2.  No associated bony destruction identified.  Major vascular structures including the sigmoid sinuses and IJ bulbs appear remain patent. 3.  Adenoid hypertrophy, could be physiologic in this age or related to lymphoproliferative disorder/HIV in this setting. 4.  Mild to moderate paranasal sinus inflammatory changes.  Original Report Authenticated By: Harley Hallmark, M.D.   Operative note:  PHYSICIAN: Cristal Deer E. 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  POSTOPERATIVE DIAGNOSIS: Chronic otitis media with effusion with secondary mastoiditis  OPERATION PERFORMED: Bilateral myringotomy and tubes (Paparella type 1  tubes). Culture.  SURGEON: Kristine Garbe. Ezzard Standing, MD  ANESTHESIA: General endotracheal.  COMPLICATIONS: None.   BRIEF CLINICAL NOTE: Alison Chambers is a 26 year old female who is HIV positive who has been having chronic ear infection now for over a month, not responding well to outpatient p.o. antibiotics. She was admitted yesterday for IV antibiotics. A CT scan showed bilateral mastoiditis as well as otitis media. She was started on broad-spectrum  IV antibiotics, and taken to the operating room at this time for BMTs for cultures and help drain the middle ear space.   DESCRIPTION OF PROCEDURE: After adequate endotracheal anesthesia, the right ear was examined first. The TM was bulging. She has slight inflammatory changes of the external ear canal. A myringotomy was made in the posterior inferior portion of the TM, more of a serous effusion was aspirated. A Paparella type 1 tube was inserted followed by Ciprodex ear drops which was insufflated into the middle ear space. A  culture was obtained by putting a small cotton ball down in the ear to absorb the fluid and this was then placed in the culture medium.  Following this, next the left ear was examined. The left ear had more inflammatory changes at the external ear canal making limited  visualization of the TM. Again, a myringotomy was made in the posterior portion of the TM because the anterior portion of the TM could not be  visualized secondary to swelling. Again, a mucoserous effusion was aspirated. There was granulation tissue within the middle ear space, a  small portion of this was grasped and placed in the culture medium. A Paparella type 1 tube was inserted followed by Ciprodex ear drops which were again  insufflated into the middle ear space. This completed the procedure. Lezlie Lye was awoken from anesthesia, and transferred to the recovery room postop doing well.   DISPOSITION: She was transferred back to the floor. We will continue with her broad spectrum antibiotics and Ciprodex ear drops, 4 drops per ear twice a day.  Admission HPI:  The patient is a 26 YO female who presents with 1 month history of ear pain. She states that she was seen in the ED about 1-2 weeks ago and was given some anti-biotics and ear drops for it which she used. Clarithromycin was what she used. She took it as directed. She has had worsening of the pain and some hearing loss as well. She is also having a  headache with sensitivity to light today. She is having nasal drainage but no sore throat. She did have fever at home today and came in to the ED. No abdominal pain, change in bowels, urinary symptoms, chest pain, nausea, vomiting. She has past medical history of HIV for which she sees the ID clinic and she says that she takes her medications. Last CD4 count is 90.  Hospital Course by problem list:  Acute mastoiditis - The patient came in and CT scan revealed acute mastoiditis for which ENT was consulted. Dr. Ezzard Standing came to see her and put in myringotomy tubes bilaterally to help drain the area in the middle ear bilaterally. Her ear pain and hearing loss did gradually subside. She was kept on IV vancomycin and ceftriaxone during her hospital course and will be transitioned to PO medications upon discharge. She was discharged with ciprofloxacin ear drops for 1 week and doxycycline and oral ciprofloxacin for 3 weeks post discharge. She will need the ear tubes in place for 2-3 months and they will fall out naturally in the next 8-12 months. She will follow up with the ENT doctor in 1 week after discharge. She was in good condition on the day of discharge. She did have some low potassium levels that were repleted and likely as result of poor PO intake prior to admission and NPO status in the hospital. Her magnesium level was checked and was normal.   HIV + - Last CD4 count is 90 and did continue her HAART and bactrim ppx while in the hospital. She was tolerating that fine and was discharged on the same regimen.   Discharge Vitals:  BP 127/65  Pulse 63  Temp(Src) 98.3 F (36.8 C) (Oral)  Resp 18  Ht 5\' 6"  (1.676 m)  Wt 227 lb 15.3 oz (103.4 kg)  BMI 36.79 kg/m2  SpO2 99%  LMP 12/14/2011  Discharge Labs:  Results for orders placed during the hospital encounter of 01/04/12 (from the past 24 hour(s))  MAGNESIUM     Status: Normal   Collection Time   01/06/12 10:45 AM      Component Value Range    Magnesium 2.0  1.5 - 2.5 (mg/dL)    Signed: Genella Mech 01/07/2012, 10:30 AM

## 2012-01-07 NOTE — Progress Notes (Signed)
Pt c/o nausea.  Pt received 4mg  IV Zofran.  MD notified and no new orders received.  Alison Chambers

## 2012-01-07 NOTE — Consult Note (Signed)
POD 2 Doing much better today. Culture pending. Can probably discharge home on Ciprodex 4gtts BID And broad spectrum po abx to cover staph,pneumococ,H. Influ,M cat and pseudomonas (ext otitis) Have patient followup my office 7-10 days.  [phone]

## 2012-01-07 NOTE — Telephone Encounter (Signed)
Called after patient was discharged from the hospital that she could not afford the ear drops and that this was a cheaper alternative. Appears to have similar activity and will rx to the pharmacy and advised her to still take both oral antibiotics.

## 2012-01-08 ENCOUNTER — Telehealth: Payer: Self-pay

## 2012-01-08 NOTE — Telephone Encounter (Signed)
Tried to call patient to renew adap/rw - phone messeage said out of service area or not available at this time.

## 2012-01-09 ENCOUNTER — Encounter: Payer: Self-pay | Admitting: *Deleted

## 2012-01-09 NOTE — Telephone Encounter (Signed)
Error

## 2012-01-11 LAB — CULTURE, BLOOD (ROUTINE X 2)
Culture  Setup Time: 201303230131
Culture: NO GROWTH
Culture: NO GROWTH

## 2012-01-14 ENCOUNTER — Other Ambulatory Visit: Payer: Self-pay

## 2012-01-28 ENCOUNTER — Ambulatory Visit: Payer: Self-pay | Admitting: Infectious Diseases

## 2012-02-21 ENCOUNTER — Emergency Department (HOSPITAL_COMMUNITY)
Admission: EM | Admit: 2012-02-21 | Discharge: 2012-02-21 | Discharge status: Absent without leave | Payer: Self-pay | Source: Home / Self Care

## 2012-02-21 ENCOUNTER — Encounter (HOSPITAL_COMMUNITY): Payer: Self-pay | Admitting: Emergency Medicine

## 2012-02-21 ENCOUNTER — Emergency Department (HOSPITAL_COMMUNITY)
Admission: EM | Admit: 2012-02-21 | Discharge: 2012-02-21 | Disposition: A | Discharge status: Home / Self Care | Payer: Self-pay | Attending: Emergency Medicine | Admitting: Emergency Medicine

## 2012-02-21 DIAGNOSIS — A6000 Herpesviral infection of urogenital system, unspecified: Secondary | ICD-10-CM | POA: Insufficient documentation

## 2012-02-21 DIAGNOSIS — A6009 Herpesviral infection of other urogenital tract: Secondary | ICD-10-CM

## 2012-02-21 DIAGNOSIS — L989 Disorder of the skin and subcutaneous tissue, unspecified: Secondary | ICD-10-CM | POA: Insufficient documentation

## 2012-02-21 DIAGNOSIS — B20 Human immunodeficiency virus [HIV] disease: Secondary | ICD-10-CM | POA: Insufficient documentation

## 2012-02-21 DIAGNOSIS — N949 Unspecified condition associated with female genital organs and menstrual cycle: Secondary | ICD-10-CM | POA: Insufficient documentation

## 2012-02-21 MED ORDER — OXYCODONE-ACETAMINOPHEN 5-325 MG PO TABS
2.0000 | ORAL_TABLET | Freq: Once | ORAL | Status: AC
  Administered 2012-02-21: 2 via ORAL
  Filled 2012-02-21: qty 2

## 2012-02-21 MED ORDER — OXYCODONE-ACETAMINOPHEN 10-325 MG PO TABS: 1.0000 | ORAL_TABLET | ORAL | Status: AC | PRN

## 2012-02-21 MED ORDER — ACYCLOVIR 400 MG PO TABS: 400.0000 mg | ORAL_TABLET | Freq: Four times a day (QID) | ORAL | Status: AC

## 2012-02-21 NOTE — ED Provider Notes (Signed)
Medical screening examination/treatment/procedure(s) were performed by non-physician practitioner and as supervising physician I was immediately available for consultation/collaboration.   Yani Coventry L Rachell Druckenmiller, MD 02/21/12 2326 

## 2012-02-21 NOTE — Discharge Instructions (Signed)
Please followup with Dr. Ninetta Lights as soon as possible for continued evaluation and treatment of your symptoms. Continue your antibiotics and HIV medications. Return to the emergency room if you have any worsening swelling, increased pain, fever, chills, sweats.   Herpes genital (Genital Herpes) El herpes genital es una enfermedad de transmisin sexual. Esto significa que se transmite al Pharmacologist relaciones sexuales con una persona infectada. No hay cura para el herpes genital. El tiempo que transcurre entre un episodio y Therapist, art puede ser desde algunos meses [employer]. El virus puede vivir en una persona, pero no ocasionar sntomas (problemas). Esta infeccin puede pasarse a un beb cuando desciende por el canal del parto (vagina). En un recin nacido puede causar daos en el sistema nervioso central y en los ojos, e incluso la [place]. Generalmente el virus que causa el herpes genital es el virus VHS 2. El virus que ocasiona el herpes bucal casi siempre es el VHS 1. El diagnstico (determinar cul es el problema) puede realizarse por medio de un cultivo. SNTOMAS Generalmente los sntomas de dolor y picazn aparecen [address] a una semana despus del contacto. Comienza como diminutas ampollas que progresan a pequeas lceras dolorosas que luego forman [place] y se curan despus de [employer]. Afecta la zona genital externa, la vagina, el cuello del tero, el pene, la zona anal, las nalgas y los muslos. INSTRUCCIONES PARA EL CUIDADO DOMICILIARIO  Mantenga la regin de las lceras seca y limpia.   Tome los medicamentos como se le indic. Los medicamentos antivirales pueden acelerar la curacin. Ellos no evitarn las recurrencias ni curarn esta infeccin. Estos medicamentos tambin pueden tomarse para la supresin si se producen recurrencias frecuentes.   Mientras la infeccin est activa, es contagiosa. Evite todos los contactos sexuales durante las infecciones en [place].   Los preservativos  pueden ser tiles para prevenir la diseminacin del virus del herpes.   Practique el sexo seguro.   Lvese las manos cuidadosamente despus de tocar la zona genital.   Evite tocarse los ojos despus de tocarse la zona genital.   Informe al profesional que lo asiste si tiene herpes genital y [place]. Es su responsabilidad asegurar que en este embarazo el beb llegue a un buen trmino.   Utilice los medicamentos de venta libre o de prescripcin para Chief Technology Officer, Environmental health practitioner o la [place], segn se lo indique el profesional que lo asiste.  SOLICITE ATENCIN MDICA SI:  Tiene recurrencia de la infeccin.   No responde o no obtiene [employer].   Aparece un nuevo dolor o secreciones que sean diferentes de la infeccin original.   Usted tiene una temperatura oral de ms de 102 F (38.9 C).   Presenta dolor abdominal.   Comienza a sentir dolor en los ojos o signos de infeccin ocular.  Document Released: 07/11/2005 Document Revised: 09/20/2011 [facility] Patient Information 2012 [place], [place].  RESOURCE GUIDE  Dental Problems  Patients with Medicaid: [facility] [phone] [address]                                           [phone] [address] Phone:  [phone]  Phone:  [phone]  If unable to pay or uninsured, contact:  [facility] or [facility]. to become qualified for the adult dental clinic.  Chronic Pain Problems Contact [facility]  [phone] Patients need to be referred by their primary care doctor.  Insufficient Money for Medicine Contact United Way:  call "211" or [facility] [phone].  No Primary Care Doctor Call [facility]  [phone] Other agencies that provide inexpensive medical care    [facility]  [phone]    [facility]  [phone]    [facility]   [phone]    [facility]  [phone]    [facility] Parenthood  [phone]    [facility]  [phone]  Psychological Services [facility]  [phone] [facility]  [phone] [facility]   [phone] (emergency services [phone])  Substance Abuse Resources [facility] Drug Services  [phone] [facility] 502-324-1652 [facility] [phone] Floydene Flock [phone] Residential & Outpatient Substance Abuse Program  ([phone]  Abuse/Neglect [facility] Child Abuse Hotline 5640161569 [facility] Child Abuse Hotline (315) [phone] (After Hours)  Emergency Shelter [facility] [phone]  Maternity Homes Room at the [place] [facility] [phone] [facility] [phone]  MRSA Hotline #:   2136863277    [facility] Resources  [facility]     United Way                          [facility] Dept. [address] [place]                       [address]      [address]  Blondell Reveal Phone:  [phone]                                   Phone:  [phone]                 Phone:  [phone]  [facility] Phone:  [phone]  [facility] Child Abuse Hotline [phone] (After Hours)

## 2012-02-21 NOTE — ED Notes (Signed)
The pt has a lesion on her vagina for several days that has burst once but is still painful
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
## 2012-02-21 NOTE — ED Notes (Signed)
Pt reports one week, having abscess to vaginal area- reports feels firm around bump and burning pain; denies fevers

## 2012-03-14 ENCOUNTER — Encounter: Payer: Self-pay | Admitting: *Deleted

## 2012-03-14 ENCOUNTER — Telehealth: Payer: Self-pay | Admitting: *Deleted

## 2012-03-14 NOTE — Telephone Encounter (Signed)
Pt is Spanish speaking.  Will send the pt a letter in Spanish requesting she call to make an appt for a PAP smear.

## 2012-03-25 ENCOUNTER — Encounter: Payer: Self-pay | Admitting: *Deleted

## 2012-03-25 NOTE — Progress Notes (Signed)
Pt has not responded to multiple letters in Spanish to make a PAP smear appointment.  She No Showed for her last lab and MD appts.  Pt is Hispanic and primarily language is [place].  She understands some [place].  Appreciate the help of the [employer].

## 2012-04-05 ENCOUNTER — Emergency Department (HOSPITAL_COMMUNITY)
Admission: EM | Admit: 2012-04-05 | Discharge: 2012-04-05 | Disposition: A | Discharge status: Home / Self Care | Payer: Self-pay | Attending: Emergency Medicine | Admitting: Emergency Medicine

## 2012-04-05 ENCOUNTER — Emergency Department (HOSPITAL_COMMUNITY): Payer: Self-pay

## 2012-04-05 ENCOUNTER — Encounter (HOSPITAL_COMMUNITY): Payer: Self-pay

## 2012-04-05 DIAGNOSIS — Z21 Asymptomatic human immunodeficiency virus [HIV] infection status: Secondary | ICD-10-CM | POA: Insufficient documentation

## 2012-04-05 DIAGNOSIS — J329 Chronic sinusitis, unspecified: Secondary | ICD-10-CM | POA: Insufficient documentation

## 2012-04-05 DIAGNOSIS — B009 Herpesviral infection, unspecified: Secondary | ICD-10-CM | POA: Insufficient documentation

## 2012-04-05 DIAGNOSIS — J40 Bronchitis, not specified as acute or chronic: Secondary | ICD-10-CM | POA: Insufficient documentation

## 2012-04-05 MED ORDER — ALBUTEROL SULFATE HFA 108 (90 BASE) MCG/ACT IN AERS
2.0000 | INHALATION_SPRAY | Freq: Once | RESPIRATORY_TRACT | Status: AC
  Administered 2012-04-05: 2 via RESPIRATORY_TRACT
  Filled 2012-04-05: qty 6.7

## 2012-04-05 MED ORDER — ALBUTEROL SULFATE (5 MG/ML) 0.5% IN NEBU
5.0000 mg | INHALATION_SOLUTION | Freq: Once | RESPIRATORY_TRACT | Status: AC
  Administered 2012-04-05: 5 mg via RESPIRATORY_TRACT
  Filled 2012-04-05: qty 1

## 2012-04-05 MED ORDER — IBUPROFEN 800 MG PO TABS
800.0000 mg | ORAL_TABLET | Freq: Once | ORAL | Status: AC
  Administered 2012-04-05: 800 mg via ORAL
  Filled 2012-04-05: qty 1

## 2012-04-05 MED ORDER — PREDNISONE (PAK) 10 MG PO TABS: ORAL_TABLET | ORAL | Status: AC

## 2012-04-05 MED ORDER — ACYCLOVIR 400 MG PO TABS: 400.0000 mg | ORAL_TABLET | Freq: Four times a day (QID) | ORAL | Status: AC

## 2012-04-05 NOTE — ED Notes (Signed)
Pt in from home with c/o ha/fever/blister x3 days no relief with tylenol. States last took tylenol 1000mg  3hrs ago
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
## 2012-04-05 NOTE — Discharge Instructions (Signed)
Use inhaler 1-2 puffs every 4 hours as needed for cough  Cundo no se utilizan los antibiticos (Antibiotic Nonuse)  El mdico considera que la infeccin o problema que se ha presentado no puede solucionarse con antibiticos. La causa puede ser un virus o una bacteria. Slo el mdico podr determinar cul es la causa probable de la enfermedad. El resfro es la causa ms frecuente de infecciones tanto en adultos como en nios. La causa del resfro es un virus. El tratamiento con antibiticos no tendr [employer] infeccin viral. Los virus son los responsables de la prdida de [employer] de trabajo en la atencin de los nios enfermos, y tambin la prdida de [address] de clases. Los nios pueden contraer hasta 10 resfros o gripes por ao durante los cuales pueden presentar lagrimeo, sentirse molestos o incmodos. El objetivo del tratamiento en el caso de los virus es mantener confortable al enfermo. Los antibiticos son medicamentos que se utilizan para ayudar al organismo a Production manager contra las infecciones bacterianas. Existen relativamente pocos tipos de bacterias que causan infecciones, pero hay cientos de virus. Aunque ambos ocasionan infecciones, son tipos de Holiday representative. Una infeccin viral desaparecer por s [facility] [facility] de los 7 a [address]. Las infecciones bacterianas pueden contagiarse o empeorar si no se administra un tratamiento con antibiticos. Ejemplos de infecciones bacterianas son:  Anginas (como en las faringitis estreptoccicas o la amigdalitis).   Infecciones en el pulmn (neumona).   Infecciones en el odo y la piel.  Ejemplos de infecciones virales son:  Resfros o gripe   La mayora de los casos de tos y bronquitis.   Anginas que no son causadas por el estreptococo.   Secrecin nasal.  Lo mejor es no administrar antibiticos cuando una infeccin viral es la causa del problema. Los antibiticos pueden destruir las bacterias que son buenas para el organismo  y se encuentran dentro del mismo y pueden hacer que las bacterias dainas se desarrollen. Los antibiticos pueden tener efectos indeseables como [place], nuseas y diarrea y no mejoran los sntomas de las infecciones virales. Adems, el uso repetido de antibiticos puede hacer que las bacterias que se encuentran dentro del organismo se vuelvan resistentes. Esa resistencia puede transmitirse a las bacterias dainas. La prxima vez que sufra una infeccin puede ser difcil tratarla si han utilizado antibiticos cuando no era necesario. Cuando no se utilizan antibiticos, el sistema inmunolgico se fortalece y combate las infecciones ms eficientemente. Tambin los antibiticos tendrn un mayor efecto cuando se prescriben en las infecciones bacterianas. En el caso de los nios, los tratamientos incluyen:  La administracin de lquidos extra [employer] da para hidratarlo.   Debe hacer reposo.   Slo adminstrele medicamentos de venta libre o los que le prescriba su mdico para Engineer, materials, el malestar o la fiebre, segn las indicaciones.   El uso de un humidificador fro puede ser de utilidad cuando hay secrecin nasal.   Medicamentos para el resfro segn las indicaciones del mdico.  El profesional que lo asiste podr prescribirle antibiticos si:  El problema que presenta hoy contina durante un tiempo mayor del esperado.   Sufre una infeccin bacteriana secundaria.  SOLICITE ATENCIN MDICA SI:  La fiebre dura ms de 5 das.   Los sntomas no mejoran luego de 5 a [address], o [place].   Tiene dificultad para respirar.   Tiene sntomas de deshidratacin (bebe poco, no orina con frecuencia, la orina es de color oscuro).   Observa cambios en  la conducta o siente ms cansancio (apata o letargo).  Document Released: 10/01/2005 Document Revised: 09/20/2011 [facility] Patient Information 2012 [place], [place].Bronquitis (Bronchitis) La bronquitis es [employer] (el modo que tiene el  organismo de Publishing rights manager a una lesin o infeccin) de los bronquios Los bronquios son los conductos que se extienden desde la trquea [employer]. Si la inflamacin se agrava, puede causar la falta de aire. CAUSAS Las causas de la inflamacin pueden ser:  Un virus   Grmenes (bacteria).   Polvo   Alergenos   La polucin y muchos otros irritantes  Las clulas que revisten el rbol bronquial estn cubiertas con pequeos pelos (cilias). Esta constantemente producen un movimiento desde los pulmones hacia la boca. De este modo se mantienen los pulmones libres de polucin. Cuando estas clulas se irritan y no pueden cumplir su funcin, comienza a formarse la mucosidad. Esto produce la caracterstica tos de la bronquitis. La tos es el mecanismo por el cual se limpian los pulmones cuando las cilias no pueden cumplir su funcin. Sin alguno de [employer], Animator se Psychologist, clinical los pulmones Entonces se desarrollara una pulmona.  El fumar es una de las causas ms frecuentes de bronquitis y puede contribuir a la neumona. Abandonar este hbito es lo ms importante que puede hacer para beneficiarse. TRATAMIENTO  El [employer] prescribir antibiticos si la causa es una bacteria, y medicamentos para abrir las vas areas y Personnel officer. Tambin puede recomendar o prescribir un expectorante. El expectorante aflojar la mucosidad para que pueda eliminarla. Slo tome medicamentos de Sales promotion account executive o prescriptos para Primary school teacher, las [place], o bajar la fiebre segn las indicaciones de su mdico.   Pharmacologist todo lo que causa el problema (por ejemplo el hbito de Art therapist) es fundamental para evitar que empeore.   Un antitusgeno puede prescribirse para [employer] de la tos.   Podrn indicarle inhalantes para aliviar los sntomas actuales y ayudar a prevenir problemas futuros.   Aquellos que sufren bronquitis crnica (recurrente) puede ser necesaria la  administracin de corticoides.  SOLICITE ATENCIN MDICA INMEDIATAMENTE SI:  Durante el tratamiento observa que elimina esputo similar a pus (purulento).   Tiene fiebre.   Su beb tiene ms de 3 meses y su temperatura rectal es de 102 F (38.9 C) o ms.   Su beb tiene 3 meses o menos y su temperatura rectal es de 100.4 F (38 C) o ms.   Se siente cada vez ms enfermo.   Tiene cada vez ms dificultad para respirar, tiene ruidos al respirar o Company secretary.  Es necesario buscar atencin mdica inmediata si es [place] persona de edad avanzada o sufre alguna otra enfermedad. ASEGURESE DE QUE:   Comprende estas instrucciones.   Controlar su enfermedad.   Solicitar ayuda inmediatamente si no mejora o si empeora.  Document Released: 10/01/2005 Document Revised: 09/20/2011 [facility] Patient Information 2012 [place], [place].Sinusitis (Sinusitis) Los senos paranasales son bolsas de aire que se encuentran dentro de los huesos de la cara. La aparicin de bacterias en los senos paranasales lleva a una infeccin. Esta se denomina sinusitis.Estas infecciones generalmente son el resultado de una obstruccin en los orificios que [employer] senos.  SNTOMAS Generalmente, segn que seno se infecte, habr diferentes reas en las que Special educational needs teacher.   Los senos maxilares generalmente producen dolor detrs de los ojos.   La sinusitis frontal ocasiona dolor en el medio de la frente y [place] ojos.  Eusebio Me otros  problemas (sntomas) se incluyen:  Dolor en la zona posterior de los dientes superiores.   Una secrecin similar al pus (purulenta) proveniente de la nariz.   Toda inflamacin, calor o sensibilidad en estas mismas reas son indicios de infeccin.  TRATAMIENTO La sinusitis se diagnostica a travs del examen fsico y radiografas. Si le han tomado radiografas, asegrese de [employer]. O consulte el modo en que podr obtenerlos. Su mdico le prescribir medicamentos  (antibiticos). Estos medicamentos se indican para combatir la infeccin. Tambin le prescribir un descongestivo para reducir la inflamacin del seno paranasal.  INSTRUCCIONES PARA EL CUIDADO DOMICILIARIO  Utilice los medicamentos de venta libre o de prescripcin para Chief Technology Officer, el malestar o la [place], segn se lo indique el profesional que lo asiste.   Beba gran cantidad de lquidos. Los lquidos ayudan a que las mucosas de los senos nasales drenen ms fcilmente.   Aplique bolsas de calor hmedo o hielo en las zonas ms doloridas para [employer].   Utilice Sprays nasales salinos para ayudar a humedecer los senos nasales. Estos pueden encontrarse en la farmacia local.  SOLICITE ATENCIN MDICA DE INMEDIATO SI:  Lance Muss.   Dolor en aumento, dolor de cabeza intenso o dolor de dientes.   Nuseas, vmitos o sudoracin.   Hinchazn infrecuente alrededor del rostro o problemas en la visin.  EST SEGURO QUE:   Comprende las instrucciones para el alta mdica.   Controlar su enfermedad.   Solicitar atencin mdica de inmediato segn las indicaciones.  Document Released: 07/11/2005 Document Revised: 09/20/2011 [facility] Patient Information 2012 [place], [place].

## 2012-04-06 NOTE — ED Provider Notes (Signed)
Medical screening examination/treatment/procedure(s) were performed by non-physician practitioner and as supervising physician I was immediately available for consultation/collaboration.  Flint Melter, MD 04/06/12 [phone]

## 2012-04-19 ENCOUNTER — Inpatient Hospital Stay (HOSPITAL_COMMUNITY)
Admission: EM | Admit: 2012-04-19 | Discharge: 2012-04-23 | DRG: 974 | Disposition: A | Discharge status: Home / Self Care | Payer: MEDICAID | Attending: Internal Medicine | Admitting: Internal Medicine

## 2012-04-19 ENCOUNTER — Encounter (HOSPITAL_COMMUNITY): Payer: Self-pay | Admitting: *Deleted

## 2012-04-19 ENCOUNTER — Emergency Department (HOSPITAL_COMMUNITY): Payer: Self-pay

## 2012-04-19 DIAGNOSIS — R6510 Systemic inflammatory response syndrome (SIRS) of non-infectious origin without acute organ dysfunction: Secondary | ICD-10-CM

## 2012-04-19 DIAGNOSIS — D72829 Elevated white blood cell count, unspecified: Secondary | ICD-10-CM

## 2012-04-19 DIAGNOSIS — J069 Acute upper respiratory infection, unspecified: Secondary | ICD-10-CM

## 2012-04-19 DIAGNOSIS — A879 Viral meningitis, unspecified: Secondary | ICD-10-CM | POA: Diagnosis present

## 2012-04-19 DIAGNOSIS — E46 Unspecified protein-calorie malnutrition: Secondary | ICD-10-CM | POA: Diagnosis present

## 2012-04-19 DIAGNOSIS — R599 Enlarged lymph nodes, unspecified: Secondary | ICD-10-CM | POA: Diagnosis present

## 2012-04-19 DIAGNOSIS — F3289 Other specified depressive episodes: Secondary | ICD-10-CM | POA: Diagnosis present

## 2012-04-19 DIAGNOSIS — H6690 Otitis media, unspecified, unspecified ear: Secondary | ICD-10-CM | POA: Diagnosis present

## 2012-04-19 DIAGNOSIS — J019 Acute sinusitis, unspecified: Secondary | ICD-10-CM | POA: Diagnosis present

## 2012-04-19 DIAGNOSIS — F329 Major depressive disorder, single episode, unspecified: Secondary | ICD-10-CM | POA: Diagnosis present

## 2012-04-19 DIAGNOSIS — B20 Human immunodeficiency virus [HIV] disease: Principal | ICD-10-CM | POA: Diagnosis present

## 2012-04-19 DIAGNOSIS — A419 Sepsis, unspecified organism: Secondary | ICD-10-CM | POA: Diagnosis present

## 2012-04-19 DIAGNOSIS — Z6837 Body mass index (BMI) 37.0-37.9, adult: Secondary | ICD-10-CM

## 2012-04-19 DIAGNOSIS — R519 Headache, unspecified: Secondary | ICD-10-CM | POA: Diagnosis present

## 2012-04-19 DIAGNOSIS — Z880 Allergy status to penicillin: Secondary | ICD-10-CM

## 2012-04-19 DIAGNOSIS — J189 Pneumonia, unspecified organism: Secondary | ICD-10-CM | POA: Diagnosis present

## 2012-04-19 DIAGNOSIS — E871 Hypo-osmolality and hyponatremia: Secondary | ICD-10-CM | POA: Diagnosis present

## 2012-04-19 DIAGNOSIS — D638 Anemia in other chronic diseases classified elsewhere: Secondary | ICD-10-CM | POA: Diagnosis present

## 2012-04-19 DIAGNOSIS — T368X5A Adverse effect of other systemic antibiotics, initial encounter: Secondary | ICD-10-CM | POA: Diagnosis not present

## 2012-04-19 DIAGNOSIS — D649 Anemia, unspecified: Secondary | ICD-10-CM | POA: Diagnosis present

## 2012-04-19 DIAGNOSIS — G039 Meningitis, unspecified: Secondary | ICD-10-CM

## 2012-04-19 DIAGNOSIS — Z881 Allergy status to other antibiotic agents status: Secondary | ICD-10-CM

## 2012-04-19 DIAGNOSIS — L270 Generalized skin eruption due to drugs and medicaments taken internally: Secondary | ICD-10-CM | POA: Diagnosis not present

## 2012-04-19 DIAGNOSIS — R51 Headache: Secondary | ICD-10-CM | POA: Diagnosis present

## 2012-04-19 DIAGNOSIS — D892 Hypergammaglobulinemia, unspecified: Secondary | ICD-10-CM | POA: Diagnosis present

## 2012-04-19 DIAGNOSIS — R509 Fever, unspecified: Secondary | ICD-10-CM | POA: Diagnosis present

## 2012-04-19 DIAGNOSIS — D509 Iron deficiency anemia, unspecified: Secondary | ICD-10-CM | POA: Diagnosis present

## 2012-04-19 LAB — CBC
HCT: 34.9 % — ABNORMAL LOW (ref 36.0–46.0)
MCV: 81.2 fL (ref 78.0–100.0)
Platelets: 325 10*3/uL (ref 150–400)
RBC: 4.3 MIL/uL (ref 3.87–5.11)
RDW: 14.4 % (ref 11.5–15.5)
WBC: 10.2 10*3/uL (ref 4.0–10.5)

## 2012-04-19 LAB — URINALYSIS, ROUTINE W REFLEX MICROSCOPIC
Glucose, UA: NEGATIVE mg/dL
Leukocytes, UA: NEGATIVE
pH: 6.5 (ref 5.0–8.0)

## 2012-04-19 LAB — BASIC METABOLIC PANEL
CO2: 22 mEq/L (ref 19–32)
Chloride: 95 mEq/L — ABNORMAL LOW (ref 96–112)
GFR calc Af Amer: >90 mL/min (ref >90)
Sodium: 126 mEq/L — ABNORMAL LOW (ref 135–145)

## 2012-04-19 LAB — HEPATIC FUNCTION PANEL
ALT: 23 U/L (ref 0–35)
Bilirubin, Direct: 0.2 mg/dL (ref 0.0–0.3)
Indirect Bilirubin: 0.4 mg/dL (ref 0.3–0.9)

## 2012-04-19 LAB — POCT I-STAT 3, VENOUS BLOOD GAS (G3P V)
O2 Saturation: 94 %
TCO2: 21 mmol/L (ref 0–100)

## 2012-04-19 LAB — POCT PREGNANCY, URINE: Preg Test, Ur: NEGATIVE

## 2012-04-19 MED ORDER — HYDROCODONE-ACETAMINOPHEN 5-325 MG PO TABS
1.0000 | ORAL_TABLET | ORAL | Status: DC | PRN
  Administered 2012-04-20 (×2): 2 via ORAL
  Filled 2012-04-19 (×2): qty 2

## 2012-04-19 MED ORDER — ACETAMINOPHEN 325 MG PO TABS
650.0000 mg | ORAL_TABLET | Freq: Once | ORAL | Status: AC
  Administered 2012-04-19: 650 mg via ORAL
  Filled 2012-04-19: qty 2

## 2012-04-19 MED ORDER — SODIUM CHLORIDE 0.9 % IV SOLN
1000.0000 mL | Freq: Once | INTRAVENOUS | Status: AC
  Administered 2012-04-19: 1000 mL via INTRAVENOUS

## 2012-04-19 MED ORDER — HEPARIN SODIUM (PORCINE) 5000 UNIT/ML IJ SOLN
5000.0000 [IU] | Freq: Three times a day (TID) | INTRAMUSCULAR | Status: DC
  Administered 2012-04-20: 5000 [IU] via SUBCUTANEOUS
  Filled 2012-04-19 (×4): qty 1

## 2012-04-19 MED ORDER — METOCLOPRAMIDE HCL 5 MG/ML IJ SOLN
10.0000 mg | Freq: Once | INTRAMUSCULAR | Status: AC
  Administered 2012-04-19: 10 mg via INTRAVENOUS
  Filled 2012-04-19: qty 2

## 2012-04-19 MED ORDER — DIPHENHYDRAMINE HCL 50 MG/ML IJ SOLN
12.5000 mg | Freq: Once | INTRAMUSCULAR | Status: AC
  Administered 2012-04-19: 12.5 mg via INTRAVENOUS
  Filled 2012-04-19: qty 1

## 2012-04-19 MED ORDER — ONDANSETRON 4 MG PO TBDP
8.0000 mg | ORAL_TABLET | Freq: Once | ORAL | Status: AC
  Administered 2012-04-19: 8 mg via ORAL
  Filled 2012-04-19: qty 2

## 2012-04-19 MED ORDER — SODIUM CHLORIDE 0.9 % IV SOLN: 1000.0000 mL | INTRAVENOUS | Status: DC
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
## 2012-04-19 NOTE — ED Notes (Signed)
Up to the bathroom and voided without any problem

## 2012-04-19 NOTE — ED Notes (Signed)
Pt returned to room from CT

## 2012-04-19 NOTE — ED Notes (Signed)
Snacks and food offered to pt
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
## 2012-04-19 NOTE — ED Notes (Addendum)
Pt reports headache, n/v, generalized body pains, and chills. Denies diarrhea. Pt reports dizziness. Pt is HIV positive. Pt very warm to the touch. Clothing removed from pt including jacket and blanket.

## 2012-04-19 NOTE — ED Notes (Signed)
Patient transported to CT 

## 2012-04-19 NOTE — ED Notes (Addendum)
Pt reports been sick, fever, chills, cough past week. Progressively getting worse. Recently had PNA.  C/o h/a, prod cough x 1 week

## 2012-04-19 NOTE — ED Notes (Signed)
Report received, assumed care. Portable CXR at bedside

## 2012-04-20 ENCOUNTER — Inpatient Hospital Stay (HOSPITAL_COMMUNITY): Payer: Self-pay

## 2012-04-20 DIAGNOSIS — R51 Headache: Secondary | ICD-10-CM | POA: Diagnosis present

## 2012-04-20 DIAGNOSIS — J019 Acute sinusitis, unspecified: Secondary | ICD-10-CM | POA: Diagnosis present

## 2012-04-20 DIAGNOSIS — R519 Headache, unspecified: Secondary | ICD-10-CM | POA: Diagnosis present

## 2012-04-20 DIAGNOSIS — R6510 Systemic inflammatory response syndrome (SIRS) of non-infectious origin without acute organ dysfunction: Secondary | ICD-10-CM | POA: Diagnosis present

## 2012-04-20 DIAGNOSIS — R509 Fever, unspecified: Secondary | ICD-10-CM

## 2012-04-20 DIAGNOSIS — D649 Anemia, unspecified: Secondary | ICD-10-CM | POA: Diagnosis present

## 2012-04-20 LAB — CBC
MCH: 27.2 pg (ref 26.0–34.0)
MCV: 81 fL (ref 78.0–100.0)
Platelets: 255 10*3/uL (ref 150–400)
RDW: 14.5 % (ref 11.5–15.5)

## 2012-04-20 LAB — BASIC METABOLIC PANEL
BUN: 9 mg/dL (ref 6–23)
BUN: 10 mg/dL (ref 6–23)
CO2: 22 mEq/L (ref 19–32)
Calcium: 8.9 mg/dL (ref 8.4–10.5)
Calcium: 8.4 mg/dL (ref 8.4–10.5)
GFR calc Af Amer: >90 mL/min (ref >90)
GFR calc non Af Amer: >90 mL/min (ref >90)
GFR calc non Af Amer: >90 mL/min (ref >90)
Glucose, Bld: 100 mg/dL — ABNORMAL HIGH (ref 70–99)
Potassium: 4.2 mEq/L (ref 3.5–5.1)
Potassium: 3 mEq/L — ABNORMAL LOW (ref 3.5–5.1)
Sodium: 130 mEq/L — ABNORMAL LOW (ref 135–145)

## 2012-04-20 LAB — DIFFERENTIAL
Basophils Absolute: 0 10*3/uL (ref 0.0–0.1)
Basophils Relative: 0 % (ref 0–1)
Lymphocytes Relative: 6 % — ABNORMAL LOW (ref 12–46)
Monocytes Absolute: 0.3 10*3/uL (ref 0.1–1.0)
Neutrophils Relative %: 91 % — ABNORMAL HIGH (ref 43–77)

## 2012-04-20 LAB — URINE CULTURE: Colony Count: 10000

## 2012-04-20 LAB — LACTIC ACID, PLASMA: Lactic Acid, Venous: 2.3 mmol/L — ABNORMAL HIGH (ref 0.5–2.2)

## 2012-04-20 LAB — BLOOD GAS, ARTERIAL
Patient temperature: 98.6
TCO2: 21.2 mmol/L (ref 0–100)
pH, Arterial: 7.425 (ref 7.350–7.450)

## 2012-04-20 LAB — CRYPTOCOCCAL ANTIGEN: Crypto Ag: NEGATIVE

## 2012-04-20 MED ORDER — SODIUM CHLORIDE 0.9 % IV SOLN
Freq: Once | INTRAVENOUS | Status: AC
  Administered 2012-04-20: 21:00:00 via INTRAVENOUS

## 2012-04-20 MED ORDER — IBUPROFEN 400 MG PO TABS
400.0000 mg | ORAL_TABLET | Freq: Four times a day (QID) | ORAL | Status: DC | PRN
  Filled 2012-04-20 (×2): qty 1

## 2012-04-20 MED ORDER — OXYCODONE HCL 5 MG PO TABS
10.0000 mg | ORAL_TABLET | Freq: Four times a day (QID) | ORAL | Status: DC | PRN
  Administered 2012-04-20 – 2012-04-21 (×5): 10 mg via ORAL
  Filled 2012-04-20 (×5): qty 2

## 2012-04-20 MED ORDER — MORPHINE SULFATE 2 MG/ML IJ SOLN
2.0000 mg | INTRAMUSCULAR | Status: DC | PRN
  Administered 2012-04-20: 2 mg via INTRAVENOUS
  Filled 2012-04-20: qty 1

## 2012-04-20 MED ORDER — SODIUM CHLORIDE 0.9 % IV SOLN
2.0000 g | Freq: Three times a day (TID) | INTRAVENOUS | Status: DC
  Administered 2012-04-20 – 2012-04-22 (×7): 2 g via INTRAVENOUS
  Filled 2012-04-20 (×10): qty 2

## 2012-04-20 MED ORDER — POTASSIUM CHLORIDE CRYS ER 20 MEQ PO TBCR
40.0000 meq | EXTENDED_RELEASE_TABLET | ORAL | Status: AC
  Administered 2012-04-20 (×2): 40 meq via ORAL
  Filled 2012-04-20 (×2): qty 2

## 2012-04-20 MED ORDER — DOXYCYCLINE HYCLATE 100 MG PO TABS
100.0000 mg | ORAL_TABLET | Freq: Two times a day (BID) | ORAL | Status: DC
  Administered 2012-04-20: 100 mg via ORAL
  Filled 2012-04-20 (×3): qty 1

## 2012-04-20 MED ORDER — SODIUM CHLORIDE 0.9 % IV SOLN
1000.0000 mL | INTRAVENOUS | Status: DC
  Administered 2012-04-20: 1000 mL via INTRAVENOUS

## 2012-04-20 MED ORDER — EMTRICITABINE-TENOFOVIR DF 200-300 MG PO TABS
1.0000 | ORAL_TABLET | Freq: Every day | ORAL | Status: DC
  Administered 2012-04-20 – 2012-04-23 (×4): 1 via ORAL
  Filled 2012-04-20 (×5): qty 1

## 2012-04-20 MED ORDER — ACETAMINOPHEN 325 MG PO TABS
650.0000 mg | ORAL_TABLET | Freq: Four times a day (QID) | ORAL | Status: DC | PRN
  Administered 2012-04-20 – 2012-04-22 (×5): 650 mg via ORAL
  Filled 2012-04-20 (×5): qty 2

## 2012-04-20 MED ORDER — RALTEGRAVIR POTASSIUM 400 MG PO TABS
400.0000 mg | ORAL_TABLET | Freq: Two times a day (BID) | ORAL | Status: DC
  Administered 2012-04-20 – 2012-04-23 (×8): 400 mg via ORAL
  Filled 2012-04-20 (×10): qty 1

## 2012-04-20 MED ORDER — SODIUM CHLORIDE 0.9 % IV SOLN: 1000.0000 mL | INTRAVENOUS | Status: DC

## 2012-04-20 MED ORDER — IBUPROFEN 400 MG PO TABS
400.0000 mg | ORAL_TABLET | Freq: Once | ORAL | Status: AC
  Administered 2012-04-20: 400 mg via ORAL
  Filled 2012-04-20: qty 1

## 2012-04-20 MED ORDER — ONDANSETRON HCL 4 MG/2ML IJ SOLN
4.0000 mg | Freq: Four times a day (QID) | INTRAMUSCULAR | Status: DC | PRN
Start: 2012-04-20 — End: 2012-04-23
  Administered 2012-04-20 – 2012-04-23 (×5): 4 mg via INTRAVENOUS
  Filled 2012-04-20 (×5): qty 2

## 2012-04-20 MED ORDER — BENZONATATE 100 MG PO CAPS
100.0000 mg | ORAL_CAPSULE | Freq: Once | ORAL | Status: AC
  Administered 2012-04-20: 100 mg via ORAL
  Filled 2012-04-20: qty 1

## 2012-04-20 MED ORDER — SODIUM CHLORIDE 0.9 % IV BOLUS (SEPSIS)
1000.0000 mL | Freq: Once | INTRAVENOUS | Status: AC
  Administered 2012-04-20: 1000 mL via INTRAVENOUS

## 2012-04-20 MED ORDER — FLUCONAZOLE IN SODIUM CHLORIDE 400-0.9 MG/200ML-% IV SOLN
400.0000 mg | INTRAVENOUS | Status: DC
  Administered 2012-04-20: 400 mg via INTRAVENOUS
  Filled 2012-04-20 (×2): qty 200

## 2012-04-20 MED ORDER — SODIUM CHLORIDE 0.9 % IV SOLN
1.0000 g | Freq: Three times a day (TID) | INTRAVENOUS | Status: DC
  Filled 2012-04-20 (×3): qty 1

## 2012-04-20 MED ORDER — GADOBENATE DIMEGLUMINE 529 MG/ML IV SOLN
20.0000 mL | Freq: Once | INTRAVENOUS | Status: AC | PRN
  Administered 2012-04-20: 20 mL via INTRAVENOUS

## 2012-04-20 MED ORDER — BENZONATATE 100 MG PO CAPS
100.0000 mg | ORAL_CAPSULE | Freq: Three times a day (TID) | ORAL | Status: DC | PRN
  Administered 2012-04-20 – 2012-04-23 (×6): 100 mg via ORAL
  Filled 2012-04-20 (×8): qty 1

## 2012-04-20 MED ORDER — DIPHENHYDRAMINE HCL 50 MG/ML IJ SOLN
25.0000 mg | Freq: Once | INTRAMUSCULAR | Status: AC
  Administered 2012-04-20: 25 mg via INTRAVENOUS
  Filled 2012-04-20: qty 1

## 2012-04-20 MED ORDER — MORPHINE SULFATE 4 MG/ML IJ SOLN
3.0000 mg | INTRAMUSCULAR | Status: DC | PRN
  Administered 2012-04-21 – 2012-04-22 (×2): 3 mg via INTRAVENOUS
  Filled 2012-04-20 (×2): qty 1

## 2012-04-20 MED ORDER — VANCOMYCIN HCL IN DEXTROSE 1-5 GM/200ML-% IV SOLN
1000.0000 mg | Freq: Three times a day (TID) | INTRAVENOUS | Status: DC
Start: 2012-04-20 — End: 2012-04-21
  Administered 2012-04-20 – 2012-04-21 (×4): 1000 mg via INTRAVENOUS
  Filled 2012-04-20 (×7): qty 200

## 2012-04-20 NOTE — Progress Notes (Signed)
Attending Note Internal Medicine Teaching Service  MRI brain reviewed w/ and w/o contrast due to fever, worsening HA, and photophobia: Clinical Data: Severe headache. Longstanding HIV.  MRI HEAD WITHOUT AND WITH CONTRAST  Technique: Multiplanar, multiecho pulse sequences of the brain and  surrounding structures were obtained according to standard protocol  without and with intravenous contrast  Contrast: 20mL MULTIHANCE GADOBENATE DIMEGLUMINE 529 MG/ML IV SOLN  Comparison: Head CT 04/19/2012  Findings: The brain shows low-level T2 signal within the white  matter diffusely, likely indicative of HIV infection of brain.  There is no evidence of old or acute focal infarction, mass lesion,  hemorrhage, hydrocephalus or extra-axial collection.  FLAIR imaging shows increased signal within the sulci near the  vertex. This can be seen with increased protein content of CSF.  After contrast administration, there is low-level leptomeningeal  enhancement. These findings raise the possibility of meningitis.  There are inflammatory changes of the paranasal sinuses. There is  fluid in the mastoid air cells more on the left than the right.  There are multiple Lymphoepithelial cysts of the parotid glands.  There is prominence of the nasopharyngeal lymphoid tissue. There  is low signal of the cervical spinal vertebral marrow. All of  these findings are typical of HIV infection.  There are some small metallic objects in the right posterior  parietal scalp.  IMPRESSION:  Increased FLAIR signal within the sulci near the vertex.  Leptomeningeal contrast enhancement. These findings raise the  possibility of meningitis.  Low-level T2 signal within the cerebral hemispheric white matter  that could indicate HIV infection of brain. No focal brain  pathology.  Paranasal sinus inflammation, Lymphoepithelial cysts of the parotid  glands, hypointense marrow signal and prominent nasopharyngeal  lymphoid tissue,  findings frequently seen in HIV infection.   Given above findings, discussed LP with patient. Informed consent obtained. Four attempts were made with 20G, 3.5 in spinal tap needle using sterile technique and 1 mL of 1% lidocaine anesthetic. The patient was unable to maintain positioning for procedure and due to body habitus, the spinal needle in provided kit was too short. The patient became concerned about possible pain and became uncomfortable with procedure, I attempted to obtain a longer spinal needle without success. Procedure was aborted after 4th try due to above. Hemostasis obtained with pressure, no neurological complaints or deficits noted after procedure. Request to perform under fluoroscopy in the morning. At this time, will continue current antibiotic management. Patient states her headache is improved and overall feels better.  Jonah Blue DO

## 2012-04-20 NOTE — Progress Notes (Signed)
S: patient c/o itching  O:General: NAD     Lungs CTA B/L     Heart RRR     Ext: no edema     Skin: small macula rash noted on limbs and abd A/P - patient has had mild itching and received Benadryl 12.5 mg Iv x 1 dose close to 1900 pm last night - no obvious source identified. ? Medications induced - will D/C heparin and Norco as they are new medications for her.
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
## 2012-04-20 NOTE — Progress Notes (Signed)
Subjective: [name] is complaining of an 8/10 frontal headache that was not responsive to APAP.  She also endorses neck pain and photophobia, but she denies neck stiffness.  Her cough seems to be bothering her the most and is exacerbating her headache.  Objective: Vital signs in last 24 hours: Temp:  [98.2 F (36.8 C)-101.8 F (38.8 C)] 100.4 F (38 C) (07/07 0900) Pulse Rate:  [100-152] 132  (07/07 0900) Resp:  [17-26] 18  (07/07 0900) BP: (99-118)/(57-68) 99/68 mmHg (07/07 0900) SpO2:  [93 %-100 %] 93 % (07/07 0900) Weight:  [220 lb (99.791 kg)] 220 lb (99.791 kg) (07/07 0035) Weight change:  Last BM Date: 04/19/12   General appearance: alert and moderate distress Eyes: pale conjunctiva Nose: erythematous mucosa Throat: normal findings: oropharynx pink & moist without lesions or evidence of thrush Resp: clear to auscultation bilaterally Cardio: tachycardic, normal rhythym GI: soft, non-tender; bowel sounds normal; no masses,  no organomegaly  Lab Results:  Basename 04/20/12 0505 04/19/12 1327  WBC 10.6* 10.2  HGB 10.0* 11.7*  HCT 29.8* 34.9*  PLT 255 325   BMET  Basename 04/20/12 0505 04/20/12 0113  NA 130* 134*  K 3.0* 4.2  CL 99 100  CO2 20 22  GLUCOSE 118* 100*  BUN 10 9  CREATININE 0.64 0.61  CALCIUM 8.4 8.9    Studies/Results: Ct Head Wo Contrast  04/19/2012  *RADIOLOGY REPORT*  Clinical Data: Worsening headache  CT HEAD WITHOUT CONTRAST  Technique:  Contiguous axial images were obtained from the base of the skull through the vertex without contrast.  Comparison: 11/29/2008  Findings: The brain has a normal appearance without evidence of atrophy, old or acute infarction, mass lesion, hemorrhage, hydrocephalus or extra-axial collection.  A 5 mm cyst in a sulcus in the right parietal region is unchanged from multiple previous exams and not of clinical relevance.  IMPRESSION: Normal head CT  Original Report Authenticated By: Thomasenia Sales, M.D.   Dg Chest  Port 1v Same Day  04/19/2012  *RADIOLOGY REPORT*  Clinical Data: Right chest pain and shortness of breath.  PORTABLE CHEST 1V  Comparison: 04/05/2012.  Findings: Poor inspiration.  Normal sized heart.  Mildly prominent pulmonary vasculature and interstitial markings, accentuated by the poor inspiration.  Mild scoliosis.  IMPRESSION: No acute abnormality.  Original Report Authenticated By: Darrol Angel, M.D.    Medications:  I have reviewed the patient's current medications. Scheduled:   . sodium chloride  1,000 mL Intravenous Once   Followed by  . sodium chloride  1,000 mL Intravenous Once  . acetaminophen  650 mg Oral Once  . benzonatate  100 mg Oral Once  . diphenhydrAMINE  12.5 mg Intravenous Once  . diphenhydrAMINE  25 mg Intravenous Once  . doxycycline  100 mg Oral Q12H  . emtricitabine-tenofovir  1 tablet Oral Daily  . ibuprofen  400 mg Oral Once  . meropenem (MERREM) IV  2 g Intravenous Q8H  . metoCLOPramide (REGLAN) injection  10 mg Intravenous Once  . ondansetron  8 mg Oral Once  . potassium chloride  40 mEq Oral Q4H  . raltegravir  400 mg Oral BID  . sodium chloride  1,000 mL Intravenous Once  . sodium chloride  1,000 mL Intravenous Once  . vancomycin  1,000 mg Intravenous Q8H  . DISCONTD: heparin  5,000 Units Subcutaneous Q8H  . DISCONTD: meropenem (MERREM) IV  1 g Intravenous Q8H   Continuous:  NS 150/hr  ZOX:[id number], ondansetron (ZOFRAN) IV, oxyCODONE, DISCONTD:  HYDROcodone-acetaminophen  Assessment/Plan: this is a 26 year old female with HIV and a history of chronic OM who presented with fever, cough, and coryza.  She has also endorsed neck pain, photophobia, and a headache.  1.   Fever and leukocytosis:  Patient nearly meets SIRS criteria with a max temperature of 103.9, tachycardic, and a WBC count of 10.6.  ID was consulted, and they are not concerned about meningitis.  They feel this is an acute sinus infection.  Head CT confirmed sinus inflammation.   Because of the severe systemic response in the setting of AIDS, broad coverage was initiated with vancomycin and meropenem.  One dose of doxycycline was given, but ID was also certain this is not RMSF.  Her persistent cough could represent complication with acute bronchitis or pneumonia; CXR was not suspicious for a pulmonary infection.  Blood cultures pending  Continue meropenem & vancomycin  Daily CBC  Symptomatic treatment  2.   Dehydration:  Patient appeared dehydrated on admission.  BMET demonstrated hyponatremia and a calculated serum Osmo of 262.  FeNa was 0.33% consistent with hypovolemia.  Patient received amost 2L NS in ED.  She remains tachycardic, likely from fever, but we will monitor.  NS @ 150/hr  Orthostatic vital signs  Daily BMET  3.   AIDS:  CD4 count was 90 and viral load was 16109 in January 2013.  She has been on HAART, but does not consistently take the medications.  CD4 and viral load levels  Placed on HAART  4.  Chronic Otitis media:  The patient has been treated for recurrent bilateral ear infection and recently she underwent myringotomy with bilateral tympanostomy tubes in place. She has no acute ear pain and no tenderness is noted. No external ear discharge. Denies hearing loss.  5.   Protein gap:  Total protein 10.0, albumin 2.7.  This discordance has been present in the past (9.1/2.5 in April).  UPEP, SPEP, CRP, ESR   LOS: 1 day   Alison Chambers 04/20/2012, 3:12 PM
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
## 2012-04-20 NOTE — Progress Notes (Signed)
04/20/12 2000 Order received to transfer patient to stepdown.  Transporter here to take patient to MRI via bed.  Report called to [place], RN on 3300.  Patient to be transported to Room 3310 after MRI complete.  Patient and family aware. Alison Chambers
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
## 2012-04-21 ENCOUNTER — Inpatient Hospital Stay (HOSPITAL_COMMUNITY): Payer: Self-pay

## 2012-04-21 DIAGNOSIS — A419 Sepsis, unspecified organism: Secondary | ICD-10-CM

## 2012-04-21 DIAGNOSIS — E860 Dehydration: Secondary | ICD-10-CM

## 2012-04-21 LAB — LACTIC ACID, PLASMA: Lactic Acid, Venous: 1.3 mmol/L (ref 0.5–2.2)

## 2012-04-21 LAB — BASIC METABOLIC PANEL
BUN: 6 mg/dL (ref 6–23)
CO2: 25 mEq/L (ref 19–32)
Calcium: 8.1 mg/dL — ABNORMAL LOW (ref 8.4–10.5)
Chloride: 105 mEq/L (ref 96–112)
Creatinine, Ser: 0.6 mg/dL (ref 0.50–1.10)

## 2012-04-21 LAB — CBC WITH DIFFERENTIAL/PLATELET
Blasts: 0 %
Eosinophils Absolute: 0 10*3/uL (ref 0.0–0.7)
Eosinophils Relative: 0 % (ref 0–5)
MCH: 27.1 pg (ref 26.0–34.0)
MCHC: 33.5 g/dL (ref 30.0–36.0)
MCV: 81 fL (ref 78.0–100.0)
Metamyelocytes Relative: 0 %
Myelocytes: 2 %
Platelets: 238 10*3/uL (ref 150–400)
Promyelocytes Absolute: 0 %
RDW: 14.9 % (ref 11.5–15.5)
nRBC: 0 /100 WBC

## 2012-04-21 LAB — HEPATIC FUNCTION PANEL
AST: 44 U/L — ABNORMAL HIGH (ref 0–37)
Albumin: 2.1 g/dL — ABNORMAL LOW (ref 3.5–5.2)
Alkaline Phosphatase: 109 U/L (ref 39–117)
Bilirubin, Direct: 0.4 mg/dL — ABNORMAL HIGH (ref 0.0–0.3)
Total Bilirubin: 0.8 mg/dL (ref 0.3–1.2)

## 2012-04-21 LAB — CSF CELL COUNT WITH DIFFERENTIAL
Lymphs, CSF: 91 % — ABNORMAL HIGH (ref 40–80)
Monocyte-Macrophage-Spinal Fluid: 9 % — ABNORMAL LOW (ref 15–45)
Segmented Neutrophils-CSF: 0 % (ref 0–6)
WBC, CSF: 18 /mm3 (ref 0–5)

## 2012-04-21 LAB — GRAM STAIN

## 2012-04-21 MED ORDER — SODIUM CHLORIDE 0.9 % IV SOLN
INTRAVENOUS | Status: DC
  Administered 2012-04-21: 14:00:00 via INTRAVENOUS
  Filled 2012-04-21 (×4): qty 1000

## 2012-04-21 MED ORDER — PNEUMOCOCCAL VAC POLYVALENT 25 MCG/0.5ML IJ INJ
0.5000 mL | INJECTION | INTRAMUSCULAR | Status: AC
  Administered 2012-04-22: 0.5 mL via INTRAMUSCULAR
  Filled 2012-04-21: qty 0.5

## 2012-04-21 MED ORDER — SODIUM CHLORIDE 0.9 % IJ SOLN
INTRAMUSCULAR | Status: AC
  Administered 2012-04-21: 13:00:00
  Filled 2012-04-21: qty 10

## 2012-04-21 MED ORDER — FLUCONAZOLE IN SODIUM CHLORIDE 200-0.9 MG/100ML-% IV SOLN
200.0000 mg | INTRAVENOUS | Status: DC
  Administered 2012-04-21 – 2012-04-22 (×2): 200 mg via INTRAVENOUS
  Filled 2012-04-21 (×2): qty 100

## 2012-04-21 MED ORDER — VANCOMYCIN HCL IN DEXTROSE 1-5 GM/200ML-% IV SOLN
1000.0000 mg | Freq: Three times a day (TID) | INTRAVENOUS | Status: DC
  Administered 2012-04-21 – 2012-04-22 (×3): 1000 mg via INTRAVENOUS
  Filled 2012-04-21 (×6): qty 200

## 2012-04-21 MED ORDER — DIPHENHYDRAMINE HCL 50 MG/ML IJ SOLN
INTRAMUSCULAR | Status: AC
  Filled 2012-04-21: qty 1

## 2012-04-21 MED ORDER — POTASSIUM CHLORIDE CRYS ER 20 MEQ PO TBCR
40.0000 meq | EXTENDED_RELEASE_TABLET | Freq: Once | ORAL | Status: AC
  Administered 2012-04-21: 40 meq via ORAL
  Filled 2012-04-21: qty 2

## 2012-04-21 MED ORDER — DAPSONE 100 MG PO TABS
100.0000 mg | ORAL_TABLET | Freq: Every day | ORAL | Status: DC
  Administered 2012-04-21 – 2012-04-23 (×3): 100 mg via ORAL
  Filled 2012-04-21 (×6): qty 1

## 2012-04-21 MED ORDER — DIPHENHYDRAMINE HCL 25 MG PO CAPS: 25.0000 mg | ORAL_CAPSULE | Freq: Four times a day (QID) | ORAL | Status: DC | PRN

## 2012-04-21 MED ORDER — BIOTENE DRY MOUTH MT LIQD
15.0000 mL | Freq: Two times a day (BID) | OROMUCOSAL | Status: DC
  Administered 2012-04-21 – 2012-04-23 (×4): 15 mL via OROMUCOSAL

## 2012-04-21 MED ORDER — DIPHENHYDRAMINE HCL 50 MG/ML IJ SOLN
25.0000 mg | Freq: Four times a day (QID) | INTRAMUSCULAR | Status: DC | PRN
  Administered 2012-04-21 (×3): 25 mg via INTRAVENOUS
  Filled 2012-04-21 (×3): qty 1

## 2012-04-21 NOTE — Progress Notes (Signed)
Called to pt bedside at 2247 for itching and perceived allergic reaction along with some nausea during vanco infusion. Pt vitals showed tachycardia otherwise stable. D/C vanco infusion, ordered benadryl, and zofran for nausea. When arrived pt was sitting up in bed itching but otherwise in NAD. Pt complained of no pain, no SOB, no CP/tightness, no swelling of tongue or mouth, no rashes.  Filed Vitals:   04/21/12 2008  BP: 116/74  Pulse: 110  Temp: 99 F (37.2 C)  Resp: 20   PE: gen pt NAD, itching, able to speak freely/ resp CTAB, oropharynx no edema or erythema, patent/ abd soft, nt, nd, BS+, skin no urticaria, welts, rashes, no pitting edema, warm, well perfused.   Continued last 50 ml of vanco at slow rate after benadryl. Informed nurse to update with any changes in pt vitals, status, or symptoms would come and re-evaluate. Pts nausea resolved with zofran. Will recommend pre-medication with benadryl if needed to continue vanco infusions.   Thank you.  Christen Bame, MD PGY-1 [phone]
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
## 2012-04-21 NOTE — Progress Notes (Signed)
Was called to pt bedside due to uncontrollable itching after diflucan infusion at 0054. At arrival pt was itching but had no welts or rashes, pt was able to talk and denied any pain, any CP, or SOB. Pt was just anxious and complained of trouble sleeping Filed Vitals:   04/20/12 2342  BP: 111/66  Pulse: 115  Temp: 98.6 F (37 C)  Resp: 23   Gen: pt resting in bed slightly uncomfortable, itching, no skin rashes or welts/uticharia present, oropharynx patent and not edematous, no tongue swelling, lung sounds CTAB, heart tachy, no murmurs, no rubs/gallops/S3/S4, no LE edema  Ordered IV Benadryl Q6prn. Told nurse that if any changes occurred in pts status to call w/o hesitation. Ordered Ativan to address trouble sleeping and anxiety.   Thank you. Christen Bame, MD PGY1 [phone]

## 2012-04-21 NOTE — Progress Notes (Signed)
Patient ID: Alison Chambers, female   DOB: 28-May-1986, 28 y.o.   MRN: [id number]    [facility]    Date of Admission:  04/19/2012           Day 2 vancomycin        Day 2 meropenem Principal Problem:  *SIRS (systemic inflammatory response syndrome) Active Problems:  Fever  Acute sinusitis  Headache  HIV INFECTION  DEPRESSION  Chronic otitis media  Normocytic anemia      . sodium chloride   Intravenous Once  . benzonatate  100 mg Oral Once  . diphenhydrAMINE  25 mg Intravenous Once  . emtricitabine-tenofovir  1 tablet Oral Daily  . fluconazole (DIFLUCAN) IV  200 mg Intravenous Q24H  . ibuprofen  400 mg Oral Once  . meropenem (MERREM) IV  2 g Intravenous Q8H  . potassium chloride  40 mEq Oral Once  . raltegravir  400 mg Oral BID  . sodium chloride      . vancomycin  1,000 mg Intravenous Q8H  . DISCONTD: doxycycline  100 mg Oral Q12H  . DISCONTD: fluconazole (DIFLUCAN) IV  400 mg Intravenous Q24H  . DISCONTD: vancomycin  1,000 mg Intravenous Q8H    Subjective: She states that she is feeling quite a bit better with decreased headache. She still has a lot of sinus congestion and nonproductive cough.  Objective: Temp:  [98.3 F (36.8 C)-103.1 F (39.5 C)] 98.3 F (36.8 C) (07/08 1209) Pulse Rate:  [104-135] 104  (07/08 0400) Resp:  [20-24] 21  (07/08 0400) BP: (108-125)/(65-87) 111/65 mmHg (07/08 0700) SpO2:  [94 %-99 %] 96 % (07/08 0400) Weight:  [106.4 kg (234 lb 9.1 oz)] 106.4 kg (234 lb 9.1 oz) (07/07 2115)  General: She is smiling and appears more comfortable Skin:  No rash Lungs: Clear Cor: Regular S1 and S2 and no murmurs Abdomen: Nontender   Lab Results HIV 1 RNA Quant (copies/mL)  Date Value  10/26/2011 21799*  05/03/2011 15400*  03/14/2011 15100*     CD4 T Cell Abs (cmm)  Date Value  04/20/2012 100*  10/26/2011 90*  05/03/2011 100*     Microbiology: Recent Results (from the past 240 hour(s))  CULTURE, BLOOD (ROUTINE X 2)      Status: Normal (Preliminary result)   Collection Time   04/19/12  7:00 PM      Component Value Range Status Comment   Specimen Description BLOOD RIGHT ARM   Final    Special Requests BOTTLES DRAWN AEROBIC AND ANAEROBIC 10CC EA   Final    Culture  Setup Time 04/20/2012 02:12   Final    Culture     Final    Value:        BLOOD CULTURE RECEIVED NO GROWTH TO DATE CULTURE WILL BE HELD FOR 5 DAYS BEFORE ISSUING A FINAL NEGATIVE REPORT   Report Status PENDING   Incomplete   CULTURE, BLOOD (ROUTINE X 2)     Status: Normal (Preliminary result)   Collection Time   04/19/12  7:10 PM      Component Value Range Status Comment   Specimen Description BLOOD RIGHT HAND   Final    Special Requests BOTTLES DRAWN AEROBIC ONLY   Final    Culture  Setup Time 04/20/2012 02:12   Final    Culture     Final    Value:        BLOOD CULTURE RECEIVED NO GROWTH TO DATE CULTURE WILL BE HELD  FOR 5 DAYS BEFORE ISSUING A FINAL NEGATIVE REPORT   Report Status PENDING   Incomplete   URINE CULTURE     Status: Normal   Collection Time   04/19/12  8:38 PM      Component Value Range Status Comment   Specimen Description URINE, CLEAN CATCH   Final    Special Requests NONE   Final    Culture  Setup Time 04/20/2012 02:11   Final    Colony Count 10,000 COLONIES/ML   Final    Culture     Final    Value: Multiple bacterial morphotypes present, none predominant. Suggest appropriate recollection if clinically indicated.   Report Status 04/20/2012 FINAL   Final   GRAM STAIN     Status: Normal   Collection Time   04/21/12 11:15 AM      Component Value Range Status Comment   Specimen Description CSF   Final    Special Requests 2.8ML   Final    Gram Stain     Final    Value: CYTOSPIN SLIDE:     WBC PRESENT, PREDOMINANTLY MONONUCLEAR     NO ORGANISMS SEEN   Report Status 04/21/2012 FINAL   Final     Studies/Results: Ct Head Wo Contrast  04/19/2012  *RADIOLOGY REPORT*  Clinical Data: Worsening headache  CT HEAD WITHOUT CONTRAST   Technique:  Contiguous axial images were obtained from the base of the skull through the vertex without contrast.  Comparison: 11/29/2008  Findings: The brain has a normal appearance without evidence of atrophy, old or acute infarction, mass lesion, hemorrhage, hydrocephalus or extra-axial collection.  A 5 mm cyst in a sulcus in the right parietal region is unchanged from multiple previous exams and not of clinical relevance.  IMPRESSION: Normal head CT  Original Report Authenticated By: Thomasenia Sales, M.D.   Mr Laqueta Jean MV Contrast  04/20/2012  *RADIOLOGY REPORT*  Clinical Data: Severe headache.  Longstanding HIV.  MRI HEAD WITHOUT AND WITH CONTRAST  Technique:  Multiplanar, multiecho pulse sequences of the brain and surrounding structures were obtained according to standard protocol without and with intravenous contrast  Contrast: 20mL MULTIHANCE GADOBENATE DIMEGLUMINE 529 MG/ML IV SOLN  Comparison: Head CT 04/19/2012  Findings: The brain shows low-level T2 signal within the white matter diffusely, likely indicative of HIV infection of brain. There is no evidence of old or acute focal infarction, mass lesion, hemorrhage, hydrocephalus or extra-axial collection.  FLAIR imaging shows increased signal within the sulci near the vertex.  This can be seen with increased protein content of CSF. After contrast administration, there is low-level leptomeningeal enhancement.  These findings raise the possibility of meningitis.  There are inflammatory changes of the paranasal sinuses.  There is fluid in the mastoid air cells more on the left than the right. There are multiple Lymphoepithelial cysts of the parotid glands. There is prominence of the nasopharyngeal lymphoid tissue.  There is low signal of the cervical spinal vertebral marrow.  All of these findings are typical of HIV infection.  There are some small metallic objects in the right posterior parietal scalp.  IMPRESSION: Increased FLAIR signal within the sulci  near the vertex. Leptomeningeal contrast enhancement.  These findings raise the possibility of meningitis.  Low-level T2 signal within the cerebral hemispheric white matter that could indicate HIV infection of brain. No focal brain pathology.  Paranasal sinus inflammation, Lymphoepithelial cysts of the parotid glands, hypointense marrow signal and prominent nasopharyngeal lymphoid tissue, findings frequently seen in  HIV infection.  Original Report Authenticated By: Thomasenia Sales, M.D.   Dg Chest Port 1v Same Day  04/19/2012  *RADIOLOGY REPORT*  Clinical Data: Right chest pain and shortness of breath.  PORTABLE CHEST 1V  Comparison: 04/05/2012.  Findings: Poor inspiration.  Normal sized heart.  Mildly prominent pulmonary vasculature and interstitial markings, accentuated by the poor inspiration.  Mild scoliosis.  IMPRESSION: No acute abnormality.  Original Report Authenticated By: Darrol Angel, M.D.   Dg Fluoro Guide Lumbar Puncture  04/21/2012  *RADIOLOGY REPORT*  Clinical Data: Immunocompromised.  Fever.  Headaches.  LUMBAR PUNCTURE FLUORO GUIDE  Comparison: None.  Findings: The procedure and associated risks were reviewed with the patient through an interpreter.  Questions answered.  Written as well as oral witnessed consent obtained.  Under fluoroscopic guidance and aseptic technique, an L3-4 lumbar puncture was performed with a single pass of a 20-gauge spinal needle.  Opening pressure 17 ml border.  11 ml clear cerebrospinal fluid collected and sent for labs as per request.  No immediate complications.  Postprocedure instructions reviewed with the patient.  IMPRESSION: Successful lumbar puncture with opening pressure of 17 ml of water. 11 ml of clear cerebrospinal fluid collected and sent to lab as per request.  Original Report Authenticated By: Fuller Canada, M.D.    Assessment: Her spinal tap reveals 18 white blood cells with predominance of lymphocytes and slightly elevated protein. Her Gram  stain did not reveal any organisms. Her serum and CSF cryptococcal antigen tests are negative. Her CD4 count remains low at 100 and her viral load is pending. This does not appear to be cryptococcal meningitis. The spinal fluid results were nonspecific. This could be do to partially treated bacterial meningitis, a reactive meningitis do to acute, or viral meningitis. She appears to be improving slowly continue current antibiotics for now. I would also continue current antiretroviral therapy and restart PCP prophylaxis. She does not need to be on droplet precautions now that she has received greater than 24 hours of empiric antibacterial therapy for possible bacterial meningitis.  Plan: 1. Continue current antibiotics and antiretroviral regimen 2. Await final blood and CSF culture results 3. Await HIV viral load results 4. Discontinue droplet cautions 5. Start dapsone 100 mg daily  Cliffton Asters, MD [facility] [phone] pager   [phone] cell 04/21/2012, 1:44 PM

## 2012-04-21 NOTE — Progress Notes (Signed)
CRITICAL VALUE ALERT  Critical value received:  Elevated WBC, from CSF=18  Date of notification 04/21/2012  Time of notification:  12:58  Critical value read back:yes  Nurse who received alert:  Mikey [employer]  MD notified (1st page):  Dr. Gwynn Burly  Time of first page:  1317  MD notified (2nd page):Dr. Saralyn Pilar  Time of second page: 1330  Responding MD:  Dr. Tonny Branch  Time MD responded:  [phone]

## 2012-04-21 NOTE — Procedures (Signed)
L3-4 performed with single pass of 20g spinal needle.  OP 17cm  H2O.  11 cc clear CSF collected and sent for labs as per request.  No immediate complications.

## 2012-04-21 NOTE — Progress Notes (Signed)
At approximately 2250, pt called out complaining of "itching all over" and nausea. Vancomycin started approximately one hour before. She did not complain of any chest pain, SOB, or throat swelling. She does not have any whelps or rash on assessment. Vanc paused and MD made aware. Pt given 25mg  IV benadryl PRN and 4 mg IV Zofran. Will continue to monitor.   Leta Baptist RN

## 2012-04-21 NOTE — Progress Notes (Signed)
Utilization review completed.  

## 2012-04-21 NOTE — Progress Notes (Signed)
Subjective: Alison Chambers reports feeling much better today.  She endorses mild shortness of breath, but she says her cough is resolved and her head no longer hurts.  When asked about her neck pain, she indicated it is her right shoulder that is sore.  She denies chest pain and abdominal pain.  Objective: Vital signs in last 24 hours: Temp:  [98.6 F (37 C)-103.1 F (39.5 C)] 98.6 F (37 C) (07/08 0700) Pulse Rate:  [104-135] 104  (07/08 0400) Resp:  [18-24] 21  (07/08 0400) BP: (99-125)/(65-87) 111/65 mmHg (07/08 0700) SpO2:  [93 %-99 %] 96 % (07/08 0400) Weight:  [234 lb 9.1 oz (106.4 kg)] 234 lb 9.1 oz (106.4 kg) (07/07 2115) Weight change: 14 lb 9.1 oz (6.609 kg) Last BM Date: 04/19/12  Intake/Output from previous day: 07/07 0701 - 07/08 0700 In: 2350 [I.V.:1850; IV Piggyback:500] Out: 1250 [Urine:1250] Intake/Output this shift:    General appearance: alert, cooperative and no distress Resp: clear to auscultation bilaterally Cardio: regular rate and rhythm, S1, S2 normal, no murmur, click, rub or gallop GI: soft, non-tender; bowel sounds normal; no masses,  no organomegaly Extremities: no edema, redness or tenderness in the calves or thighs  Lab Results:  CBC Basename 04/21/12 0430 04/20/12 0505  WBC 12.9* 10.6*  HGB 9.3* 10.0*  HCT 27.8* 29.8*  PLT 238 255   PMNs:  63% Bands:  29%  BMET Basename 04/21/12 0430 04/20/12 0505  NA 137 130*  K 3.2* 3.0*  CL 105 99  CO2 25 20  GLUCOSE 112* 118*  BUN 6 10  CREATININE 0.60 0.64  CALCIUM 8.1* 8.4   Hepatic Function Panel  Component Value Date/Time   PROT 8.3 04/21/2012 0430   ALBUMIN 2.1* 04/21/2012 0430   AST 44* 04/21/2012 0430   ALT 55* 04/21/2012 0430   ALKPHOS 109 04/21/2012 0430   BILITOT 0.8 04/21/2012 0430   BILIDIR 0.4* 04/21/2012 0430   IBILI 0.4 04/21/2012 0430   Lactic acid:  1.3  EXR:  125  CFP:  30.4  Cryptococcal antigen:  negative  Studies/Results: Ct Head Wo Contrast 04/19/2012 IMPRESSION: Normal head  CT  Mr Laqueta Jean Wo Contrast 04/20/2012  IMPRESSION: Increased FLAIR signal within the sulci near the vertex. Leptomeningeal contrast enhancement.  These findings raise the possibility of meningitis.  Low-level T2 signal within the cerebral hemispheric white matter that could indicate HIV infection of brain. No focal brain pathology.  Paranasal sinus inflammation, Lymphoepithelial cysts of the parotid glands, hypointense marrow signal and prominent nasopharyngeal lymphoid tissue, findings frequently seen in HIV infection.   Dg Chest [facility] 04/19/2012  1v Same Day IMPRESSION: No acute findings  Medications:  I have reviewed the patient's current medications. Scheduled:   . sodium chloride   Intravenous Once  . benzonatate  100 mg Oral Once  . diphenhydrAMINE  25 mg Intravenous Once  . emtricitabine-tenofovir  1 tablet Oral Daily  . fluconazole (DIFLUCAN) IV  400 mg Intravenous Q24H  . ibuprofen  400 mg Oral Once  . meropenem (MERREM) IV  2 g Intravenous Q8H  . potassium chloride  40 mEq Oral Q4H  . raltegravir  400 mg Oral BID  . sodium chloride  1,000 mL Intravenous Once  . sodium chloride  1,000 mL Intravenous Once  . vancomycin  1,000 mg Intravenous Q8H  . DISCONTD: doxycycline  100 mg Oral Q12H  . DISCONTD: meropenem (MERREM) IV  1 g Intravenous Q8H   Continuous:  NS, 150cc/hr  ZOX:[id number], benzonatate, diphenhydrAMINE,  gadobenate dimeglumine, ibuprofen, morphine injection, ondansetron (ZOFRAN) IV, oxyCODONE, DISCONTD: diphenhydrAMINE, DISCONTD:  morphine injection  Assessment/Plan:  This is a 26 year old female with HIV and a history of chronic OM who presented with fever, cough, and coryza. She has also endorsed neck pain, photophobia, and a headache.   1. Fever and leukocytosis: Patient nearly meets SIRS criteria with a max temperature of 103.9, tachycardic, and a WBC count of 10.6. ID was consulted, and they are not concerned about meningitis. They feel this is an acute sinus  infection. Head CT confirmed sinus inflammation. Because of the severe systemic response in the setting of AIDS, broad coverage was initiated with vancomycin and meropenem. One dose of doxycycline was given, but ID was also certain this is not RMSF. Her persistent cough could represent complication with acute bronchitis or pneumonia; CXR was not suspicious for a pulmonary infection.  LP attempted overnigtht on 7/7 was not successful.  Cryptococcus antigen was negative.  Lactate elevated at 2.3 on 7/7, down to 1.3 on 7/8 Blood cultures pending  Continue meropenem & vancomycin  Initiated fluconazole IV Cryptococcus antigen was negative Legionella and S. pneumo antigens pending LP by IR today Daily CBC  Symptomatic treatment  2. Dehydration: Patient appeared dehydrated on admission. BMET demonstrated hyponatremia and a calculated serum Osmo of 262. FeNa was 0.33% consistent with hypovolemia. Patient received amost 2L NS in ED. She remains tachycardic, likely from fever, but we will monitor.  NS @ 150/hr   Daily BMET  3. AIDS: CD4 count was 90 and viral load was 16109 in January 2013. She has been on HAART, but does not consistently take the medications.  CD4 and viral load levels pending Placed on HAART  4. Chronic Otitis media: The patient has been treated for recurrent bilateral ear infection and recently she underwent myringotomy with bilateral tympanostomy tubes in place. She has no acute ear pain and no tenderness is noted. No external ear discharge. Denies hearing loss.   5. Protein gap: Total protein 10.0, albumin 2.7. This discordance has been present in the past (9.1/2.5 in April).  Repeat on 7/8 was TP 8.3, Alb 2.1. ESR 125, CRP 30.4. UPEP, SPEP CRP 30.4 ESR 125  6. Disposition:  Inpatient admission remains a requirement     LOS: 2 days   Lollie Sails 04/21/2012, 7:58 AM

## 2012-04-22 DIAGNOSIS — G039 Meningitis, unspecified: Secondary | ICD-10-CM | POA: Diagnosis present

## 2012-04-22 DIAGNOSIS — J189 Pneumonia, unspecified organism: Secondary | ICD-10-CM | POA: Clinically undetermined

## 2012-04-22 DIAGNOSIS — E46 Unspecified protein-calorie malnutrition: Secondary | ICD-10-CM | POA: Diagnosis present

## 2012-04-22 LAB — HERPES SIMPLEX VIRUS(HSV) DNA BY PCR
HSV 1 DNA: NOT DETECTED
HSV 2 DNA: NOT DETECTED

## 2012-04-22 LAB — BASIC METABOLIC PANEL
Chloride: 104 mEq/L (ref 96–112)
GFR calc Af Amer: >90 mL/min (ref >90)
Potassium: 3 mEq/L — ABNORMAL LOW (ref 3.5–5.1)
Sodium: 136 mEq/L (ref 135–145)

## 2012-04-22 LAB — CBC WITH DIFFERENTIAL/PLATELET
Basophils Absolute: 0 10*3/uL (ref 0.0–0.1)
HCT: 25.8 % — ABNORMAL LOW (ref 36.0–46.0)
Hemoglobin: 8.8 g/dL — ABNORMAL LOW (ref 12.0–15.0)
Lymphocytes Relative: 11 % — ABNORMAL LOW (ref 12–46)
Monocytes Absolute: 0.4 10*3/uL (ref 0.1–1.0)
Monocytes Relative: 5 % (ref 3–12)
Neutro Abs: 6.6 10*3/uL (ref 1.7–7.7)
Neutrophils Relative %: 84 % — ABNORMAL HIGH (ref 43–77)
RDW: 15 % (ref 11.5–15.5)
WBC: 7.9 10*3/uL (ref 4.0–10.5)

## 2012-04-22 LAB — PROTEIN ELECTROPHORESIS, SERUM
Albumin ELP: 30.9 % — ABNORMAL LOW (ref 55.8–66.1)
Beta Globulin: 4.5 % — ABNORMAL LOW (ref 4.7–7.2)
M-Spike, %: NOT DETECTED g/dL
Total Protein ELP: 8 g/dL (ref 6.0–8.3)

## 2012-04-22 LAB — RETICULOCYTES
RBC.: 3.33 MIL/uL — ABNORMAL LOW (ref 3.87–5.11)
Retic Count, Absolute: 36.6 10*3/uL (ref 19.0–186.0)

## 2012-04-22 LAB — HIV-1 RNA QUANT-NO REFLEX-BLD: HIV 1 RNA Quant: 72440 copies/mL — ABNORMAL HIGH (ref <20)

## 2012-04-22 LAB — IRON AND TIBC
Saturation Ratios: 10 % — ABNORMAL LOW (ref 20–55)
UIBC: 147 ug/dL (ref 125–400)

## 2012-04-22 LAB — MAGNESIUM: Magnesium: 1.7 mg/dL (ref 1.5–2.5)

## 2012-04-22 LAB — FERRITIN: Ferritin: 611 ng/mL — ABNORMAL HIGH (ref 10–291)

## 2012-04-22 LAB — LEGIONELLA ANTIGEN, URINE: Legionella Antigen, Urine: NEGATIVE

## 2012-04-22 LAB — PATHOLOGIST SMEAR REVIEW

## 2012-04-22 MED ORDER — ACETAMINOPHEN 325 MG PO TABS: 650.0000 mg | ORAL_TABLET | Freq: Four times a day (QID) | ORAL | Status: DC | PRN

## 2012-04-22 MED ORDER — DIPHENHYDRAMINE HCL 50 MG/ML IJ SOLN
25.0000 mg | INTRAMUSCULAR | Status: DC
  Administered 2012-04-22: 25 mg via INTRAVENOUS

## 2012-04-22 MED ORDER — TRAZODONE HCL 100 MG PO TABS
100.0000 mg | ORAL_TABLET | Freq: Every day | ORAL | Status: DC
  Administered 2012-04-22 (×2): 100 mg via ORAL
  Filled 2012-04-22 (×4): qty 1

## 2012-04-22 MED ORDER — MORPHINE SULFATE 2 MG/ML IJ SOLN
1.0000 mg | Freq: Once | INTRAMUSCULAR | Status: AC
  Administered 2012-04-22: 1 mg via INTRAVENOUS
  Filled 2012-04-22: qty 1

## 2012-04-22 MED ORDER — MOXIFLOXACIN HCL 400 MG PO TABS
400.0000 mg | ORAL_TABLET | Freq: Every day | ORAL | Status: DC
  Administered 2012-04-22: 400 mg via ORAL
  Filled 2012-04-22 (×2): qty 1

## 2012-04-22 MED ORDER — POTASSIUM CHLORIDE CRYS ER 20 MEQ PO TBCR
40.0000 meq | EXTENDED_RELEASE_TABLET | Freq: Once | ORAL | Status: AC
  Administered 2012-04-22: 40 meq via ORAL
  Filled 2012-04-22: qty 2

## 2012-04-22 NOTE — Progress Notes (Signed)
Pt. Has been transferred to [address] room 22 via wheelchair. Husband and children have gone with the patient as weLL. Phone report called to rn on floor.

## 2012-04-22 NOTE — Progress Notes (Signed)
INTERNAL MEDICINE TEACHING SERVICE Attending Note  Date: 04/22/2012  Patient name: Alison Chambers  Medical record number: [id number]  Date of birth: 1986-07-03    This patient has been seen and discussed with the house staff. Please see their note for complete details. I concur with their findings with the following additions/corrections: States she feels better today. Decreased cough. HA is resolved. No CP. Denies SOB. States she gets itching when she takes one of her HIV meds, possibly Truvada. Review of repeat CXR, after volume resuscitation, shows RLL airspace disease. Given initial history, and presentation with SIRS, I question whether this developed in hospital or she had CAP all along. Her LP showed a picture that was either consistent with partially treated meningitis or aseptic meningitis. Overall, she has shown rapid clinical improvement. Cultures have not had any growth. She denies any pruritus or nausea with any other medication other than one of her HIV meds. There is a strong language barrier with this patient, interpreter in spanish should always be used when addressing questions like these. I have spoken and obtained history today in her native language of spanish.  A/P: 26 yr. Old hispanic female w/ hx HIV, CD4 100, hx acute mastoiditis, s/p placement of myringotomy tubes on 01/05/12, hx traumatic injury to LLE w/ multiple required surgeries including skin grafting, presented with fever, HA, cough, meets criteria for SIRS and due to suspected sources, Sepsis. - Sepsis: CXR shows evidence of RLL PNA. It must be noted that her initial clinical presentation was  suggestive of meningitis and she was started on broad spectrum coverage for this upon the case being presented to me.    LP shows evidence of possibly viral, reactive meningitis, or partially treated bacterial meningitis and is not a clear picture by any means. The MRI performed w/ and w/o contrast was suggestive of meningitis. Of  course, whether her current CSF findings and MRI findings are a result of HIV infection is also a possibility? This may have been a RLL CAP all along as well (not seen on initial CXR due to hypovolemic state and also contributing is her immunocompromised state not allowing an initial infiltrate to be seen on CXR) causing sepsis. If so, may be able to switch to Rocephin and azithro or place on PO abx. Will defer to ID for final input on treatment and possible source. -HIV: She states one of her HIV meds gives her nausea and this may have caused her to skip it, but she is unsure which one. Will defer to ID. It appears to be Isentress. - Paraprotein gap: She has both free kappa and free lambda chains increased, but her ratio is normal.  Immunofixation is still pending. I wonder whether this may be a manifestation of HIV? She's had this before earlier this year, though sepsis can do this. -Normocytic anemia: H/H decraesed likely due to initial hemoconcentration. Check iron panel, retic. -rest per resident physician note.   Jonah Blue, DO  04/22/2012, 11:00 AM

## 2012-04-22 NOTE — Discharge Summary (Signed)
Patient Name:  Alison Chambers MRN: [id number]  PCP: Provider Default, MD DOB:  [birth year]       Date of Admission:  04/19/2012  Date of Discharge:  04/23/2012      Attending Physician: Jonah Blue, DO       DISCHARGE DIAGNOSES: 1. Sepsis  Met SIRS criteria w/ infx source  Treated with IV vanc/meropnem before narrowing to PO moxifloxacin 2. Dehydration 3. AIDS  CD4 count 100  Viral load 72440  Truvada & Isentress for HAART 4. Protein gap  Increased albumin-total protein gap; likely secondary to AIDS 5. Pneumonia  RLL consolidation; covered by moxifloxicin 6. Acute sinusitis  Coryza, headache on admission; CT head confirms sinus inflammation 7. Meningitis  Leptomeningeal enhancement on MR; LP concerning for viral vs. reactive meningitis 8. Chronic anemia     DISPOSITION AND FOLLOW-UP: Alison Chambers is to follow-up with the listed providers as detailed below, at which time, the following should be addressed:   1. Follow-up visits: 1. Infectious Disease 2. Internal Medicine:  Patient received a large fluid resuscitation, recheck electrolytes  Patient treated for sinusits vs. pneumonia vs. meningitis  2. Labs / imaging needed: 1. BMET:  Low potassium at discharge following large fluid resuscitation  3. Pending labs/ test needing follow-up: 1. UPEP 2. Blood cultures 3. CSF cultures 4. CSF cytology   Discharge Orders    Future Appointments: Provider: Department: Dept Phone: Center:   05/01/2012 2:30 PM [name], MD [facility] [phone] [facility]     Future Orders Please Complete By Expires   Diet - low sodium heart healthy      Increase activity slowly      Call MD for:  temperature >100.4      Call MD for:  persistant nausea and vomiting      Call MD for:  difficulty breathing, headache or visual disturbances      Call MD for:  persistant dizziness or light-headedness      Call MD for:  extreme fatigue      Call MD for:  hives          DISCHARGE MEDICATIONS: Medication List  As of 04/23/2012  1:40 PM   STOP taking these medications         oxyCODONE-acetaminophen 5-325 MG per tablet         TAKE these medications         dapsone 100 MG tablet   Take 1 tablet (100 mg total) by mouth daily.      emtricitabine-tenofovir 200-300 MG per tablet   Commonly known as: TRUVADA   Take 1 tablet by mouth daily.      moxifloxacin 400 MG tablet   Commonly known as: AVELOX   Take 1 tablet (400 mg total) by mouth daily at 8 pm. Take 1 tablet each day for 7 days.      raltegravir 400 MG tablet   Commonly known as: ISENTRESS   Take 400 mg by mouth 2 (two) times daily.            CONSULTS: 1. Interventional Radiology 2. Infections Disease   PROCEDURES PERFORMED:  1. Dg Chest 2 View on 04/21/2012  Posterior left lower lobe airspace process compatible with pneumonia recommend radiographic follow-up to document complete resolution.  2. Dg Chest 2 View on 04/05/2012  No active cardiopulmonary disease.  3. Ct Head Wo Contrast on 04/19/2012  Normal head CT.   4. Mr Lodema Pilot Contrast on 04/20/2012  Increased FLAIR signal within the sulci near the vertex. Leptomeningeal contrast enhancement.  These findings raise the possibility of meningitis.  Low-level T2 signal within the cerebral hemispheric white matter that could indicate HIV infection of brain.  No focal brain pathology.  Paranasal sinus inflammation, Lymphoepithelial cysts of the parotid glands, hypointense marrow signal and prominent nasopharyngeal lymphoid tissue, findings frequently seen in HIV infection.  5. Dg Chest Port 1 View on 04/19/2012  No acute abnormality.   6. Dg Fluoro Guide Lumbar Puncture on 04/21/2012  Successful lumbar puncture with opening pressure of 17 ml of water. 11 ml of clear cerebrospinal fluid collected and sent to lab as per request.   ADMISSION DATA: H&P:  Alison Chambers is a 26 years old woman with a history of HIV infection for  8 years with VL 21799, CD4 90 both in 10/26/11. She presented with complaint of fever, sneezing, headache, and cough for one week. The symptoms worsened three days before presentation. She took her temperature at home and it was about 72 F two days ago. She took acetominophen but the fever did not go down which prompted her come to ED. 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 Her two children have also had similar symptoms prior to her own illness. She complains of coughing and sneezing. The couch is productive of yellowish sputum without any blood stains. No chest pain, shortness of breath. She reports headaches which are mainly frontal and constant. At arrival to ED her head was 10/10 but reduced to 1/10 following acetaminophen. No history of neck stiffness, photophobia or reduced level of consciousness.   Physical Exam: Vitals: Blood pressure 114/68, pulse 100, temperature 99 F (37.2 C), temperature source Oral, resp. rate 20, last menstrual period 03/11/2012, SpO2 100.00%.  General exam: Fully alert and orientated X 3. Has a hyponasal speech  Head: Atraumatic, and normocephalic. Has moderate tenderness supraorbital area. Maxillary sinuses are non-tender. No mastoid tenderness.  Mouth: Normal exam but oropharynx could not be fully assessed.  Eyes: Appear mildly puffy but to redness and normal vision.  Ears: Normal exam except left with visible myringotomy. No active drainage.  Neurological: The neck is soft, Kernig's sign negative and no focal neurological deficits  Chest: Clear to auscultation bilaterally.  Heart: No heart sounds, no murmurs.  Abdomen: No fullness, bowel sounds are present, no palpable masses.  Lower extremities: Left LE has a large traumatic scar in the lower 1/3rd of the leg. No edema, normal peripheral pulses.   Labs: Basic Metabolic Panel:  Basename  04/20/12 0113  04/19/12 1327   NA  134*   126*   K  4.2  3.6   CL  100  95*   CO2  22  22   GLUCOSE  100*  107*   BUN  9  10   CREATININE  0.61  0.61   CALCIUM  8.9  8.9   MG  --  --   PHOS  --  --    Liver Function Tests:  Basename  04/19/12 2308   AST  25   ALT  23   ALKPHOS  77   BILITOT  0.6   PROT  10.0*   ALBUMIN  2.7*    CBC:  Basename  04/19/12 1327   WBC  10.2   NEUTROABS  --   HGB  11.7*   HCT  34.9*   MCV  81.2   PLT  325    Coagulation:  Basename  04/19/12 1840   LABPROT  16.3*   INR  1.29    Urinalysis:  Basename  04/19/12 2037   COLORURINE  YELLOW   LABSPEC  1.004*   PHURINE  6.5   GLUCOSEU  NEGATIVE   HGBUR  NEGATIVE   BILIRUBINUR  NEGATIVE   KETONESUR  NEGATIVE   PROTEINUR  NEGATIVE   UROBILINOGEN  0.2   NITRITE  NEGATIVE   LEUKOCYTESUR  NEGATIVE     HOSPITAL COURSE: 1. Sepsis:  During this hospitalization, this patient met SIRS criteria with tachycardia, fever, and leukocytosis.  She also had several possibile sources of infection; including pneumonia, sinusitis, and meningitis.  History and physical exam at admission were concerning for meningitis; including headache, photophobia, and neck pain.  She was also complaining of a non-productive cough and coryza.  Chest xray at the time was not concerning for an acute process.  Blood cultures were obtained, but a LP was not performed in the ED; broad spectrum antibiotics were initiated (vancomycin & meropenem) before the CSF could be sampled.  Later on her first hospital day, the patient's symptoms intensified and she began vomiting.  A brain MRI demonstrated leptomeningeal enhancements and an LP was unsuccessfully attempted on the floor.  IR later successfully sampled her CSF.  Analysis of this patient's leukocytosis demonstrated a neutrophile predominance and a bandemia (28%).  CSF analysis revealed glucose 65, protein 48, RBCs 0, WBCs 18, lymphocytes 91%, and monocytes 9%.  Smears of the blood and CSF did not reveal any organisms, including  fungal staining.  Blood cultures and CSF cultures were NGTD at discharge, including fungal cultures.  Cryptococcal antigen, Legionella antigen, and S. pneumoniae antigen were all negative.  2. Dehydration:  The patient was clinically dehydrated at admission.  This was supported by a FENa of 0.33% and serum osmolality of 262.  She received fluid bolus of 2 liters in the ED and then was placed on normal saline infusion at 150 mL/hr.  This was continued until 7/9.  On the morning of discharge, her urine output was normal and her oral intake had increased.  She did become slightly hypokalemic, likely from the massive fluid resuscitation she received.  Her K on 7/8 was 3.2 and 3.0 on 7/9.  Fluids were discontinued and oral replacement with KCl was provided.  On the morning of discharge, her K was 3.1; she was given oral KCl twice.  She again appeared clinically euvolemic.  3. AIDS:  This patient's most recent CD4 count prior to admission was 90 in January 2013.  Here it was measured at 100.  Viral loads in January were 21799.  Viral loads were 72440 during this admission.  She has had HAART compliance issues in the past.  Here, she was placed on Truvada & Isentress.  Infections disease followed here during this admission.  4. Protein gap:  On admission, her albumin was 2.7 and her total protein was 10.  This gap in protein has been present in the past.  CRP was elevated at 30.38. SPEP revealed total protein 8%, albumin 30.9% (low), alpha-1 5.5% (hi), alpha-2 9.4% (nl), beta 4.5% (low), beta-2 5.9% (nl), gamma 43.8% (hi), and no M-spike. UPEP demonstrated a kappa/lambda ratio of 9.28 (nl) but was otherwise pending at discharge.  Infectious disease advised patients with HIV can have a variety of asymptomatic and benign serum protein abnormalities.  5. Pneumonia:  (See #1)  Initial chest xray did not show an acute pulmonary process.  After the patient was hydrated, consolidation of the right lower lobe  was appreciated on repeat chest xray.  The patient will finish a 7 day moxifloxacin course that will have good coverage for community acquired pneumonia.  6. Acute sinusitis:  (See #1)  Patient's symptoms of coryza, non-productive cough, fever, and headache were consistent with an acute sinusitis.  This was supported by findings on head CT and MRI.  Symptoms were much improved by discharge.  The patient will finish a 7 day moxifloxacin course that will have good coverage against S. pneumoniae, M. catarrhalis and H. influenzae.  7. Meningitis:  (See #1)  Patient's symptoms of headache, vomiting, photophobia, neck pain, and fever were consistent with meningitis.  Head MRI confirmed leptomeningeal enhancement.  CSF analysis was consistent with an aseptic meningitis.  The patient will finish a 7 day moxifloxacin course that will have good coverage of pneumococcus and meningococcus.   8. Chronic anemia:  On admission, the patient's hgb was 11.7, representing anemia partly masked by hemoconcentration.  Once euvolemic, the hgb dropped to 9.4.  Her reticulocytes were 36.6K (1.1%).  Iron studies showed:  Iron 17, TIBC 164, % saturation 10, and ferritin 611.  This suggests anemia of chronic disease with a component of iron deficiency.   DISCHARGE DATA: Vital Signs: BP 117/82  Pulse 77  Temp 97.1 F (36.2 C) (Oral)  Resp 18  Ht 5\' 6"  (1.676 m)  Wt 234 lb 9.1 oz (106.4 kg)  BMI 37.86 kg/m2  SpO2 98%  LMP 03/11/2012  Labs: Results for orders placed during the hospital encounter of 04/19/12 (from the past 24 hour(s))  CBC WITH DIFFERENTIAL     Status: Abnormal   Collection Time   04/23/12  6:00 AM      Component Value Range   WBC 4.7  4.0 - 10.5 K/uL   RBC 3.49 (*) 3.87 - 5.11 MIL/uL   Hemoglobin 9.4 (*) 12.0 - 15.0 g/dL   HCT 16.1 (*) 09.6 - 04.5 %   MCV 81.7  78.0 - 100.0 fL   MCH 26.9  26.0 - 34.0 pg   MCHC 33.0  30.0 - 36.0 g/dL   RDW 40.9  81.1 - 91.4 %   Platelets 263  150 - 400 K/uL    Neutrophils Relative 72  43 - 77 %   Neutro Abs 3.4  1.7 - 7.7 K/uL   Lymphocytes Relative 19  12 - 46 %   Lymphs Abs 0.9  0.7 - 4.0 K/uL   Monocytes Relative 8  3 - 12 %   Monocytes Absolute 0.4  0.1 - 1.0 K/uL   Eosinophils Relative 0  0 - 5 %   Eosinophils Absolute 0.0  0.0 - 0.7 K/uL   Basophils Relative 0  0 - 1 %   Basophils Absolute 0.0  0.0 - 0.1 K/uL  BASIC METABOLIC PANEL     Status: Abnormal   Collection Time   04/23/12  6:00 AM      Component Value Range   Sodium 137  135 - 145 mEq/L   Potassium 3.1 (*) 3.5 - 5.1 mEq/L   Chloride 102  96 - 112 mEq/L   CO2 27  19 - 32 mEq/L   Glucose, Bld 89  70 - 99 mg/dL   BUN 3 (*) 6 - 23 mg/dL   Creatinine, Ser 7.82  0.50 - 1.10 mg/dL   Calcium 8.4  8.4 -  10.5 mg/dL   GFR calc non Af Amer >90  >90 mL/min   GFR calc Af Amer >90  >90 mL/min    Time spent on discharge: 45 minutes   Signed:  Lollie Sails, MD   PGY I, Internal Medicine Resident 04/23/2012, 1:40 PM

## 2012-04-22 NOTE — Progress Notes (Signed)
Patient ID: Alison Chambers, female   DOB: 1986/08/31, 26 y.o.   MRN: [id number]    [facility]    Date of Admission:  04/19/2012           Day 3 vancomycin        Day 3 meropenem Principal Problem:  *SIRS (systemic inflammatory response syndrome) Active Problems:  Fever  Acute sinusitis  Headache  HIV INFECTION  DEPRESSION  Chronic otitis media  Normocytic anemia  Protein malnutrition  Meningitis  Pneumonia      . antiseptic oral rinse  15 mL Mouth Rinse BID  . dapsone  100 mg Oral Daily  . diphenhydrAMINE  25 mg Intravenous UD  . emtricitabine-tenofovir  1 tablet Oral Daily  . fluconazole (DIFLUCAN) IV  200 mg Intravenous Q24H  . meropenem (MERREM) IV  2 g Intravenous Q8H  . pneumococcal 23 valent vaccine  0.5 mL Intramuscular Tomorrow-1000  . potassium chloride  40 mEq Oral Once  . raltegravir  400 mg Oral BID  . traZODone  100 mg Oral QHS  . vancomycin  1,000 mg Intravenous Q8H    Subjective: She is feeling much better. She still has some dry cough but her sinus congestion and headache are much improved.  Objective: Temp:  [98.4 F (36.9 C)-99.6 F (37.6 C)] 98.9 F (37.2 C) (07/09 1248) Pulse Rate:  [88-110] 88  (07/09 1248) Resp:  [20-27] 22  (07/09 1248) BP: (110-116)/(66-81) 116/81 mmHg (07/09 1248) SpO2:  [97 %-100 %] 100 % (07/09 1248)  General: she is alert and smiling Skin: no rash Lungs: distant breath sounds with no abnormalities heard Cor: distant heart sounds with a regular S1 and S2 and no murmurs  Lab Results Lab Results  Component Value Date   WBC 7.9 04/22/2012   HGB 8.8* 04/22/2012   HCT 25.8* 04/22/2012   MCV 81.6 04/22/2012   PLT 227 04/22/2012    Lab Results  Component Value Date   CREATININE 0.52 04/22/2012   BUN 4* 04/22/2012   NA 136 04/22/2012   K 3.0* 04/22/2012   CL 104 04/22/2012   CO2 23 04/22/2012    Lab Results  Component Value Date   ALT 55* 04/21/2012   AST 44* 04/21/2012   ALKPHOS 109 04/21/2012   BILITOT 0.8  04/21/2012      HIV 1 RNA Quant (copies/mL)  Date Value  10/26/2011 21799*  05/03/2011 15400*  03/14/2011 15100*     CD4 T Cell Abs (cmm)  Date Value  04/20/2012 100*  10/26/2011 90*  05/03/2011 100*    Microbiology: Recent Results (from the past 240 hour(s))  CULTURE, BLOOD (ROUTINE X 2)     Status: Normal (Preliminary result)   Collection Time   04/19/12  7:00 PM      Component Value Range Status Comment   Specimen Description BLOOD RIGHT ARM   Final    Special Requests BOTTLES DRAWN AEROBIC AND ANAEROBIC 10CC EA   Final    Culture  Setup Time 04/20/2012 02:12   Final    Culture     Final    Value:        BLOOD CULTURE RECEIVED NO GROWTH TO DATE CULTURE WILL BE HELD FOR 5 DAYS BEFORE ISSUING A FINAL NEGATIVE REPORT   Report Status PENDING   Incomplete   CULTURE, BLOOD (ROUTINE X 2)     Status: Normal (Preliminary result)   Collection Time   04/19/12  7:10 PM  Component Value Range Status Comment   Specimen Description BLOOD RIGHT HAND   Final    Special Requests BOTTLES DRAWN AEROBIC ONLY   Final    Culture  Setup Time 04/20/2012 02:12   Final    Culture     Final    Value:        BLOOD CULTURE RECEIVED NO GROWTH TO DATE CULTURE WILL BE HELD FOR 5 DAYS BEFORE ISSUING A FINAL NEGATIVE REPORT   Report Status PENDING   Incomplete   URINE CULTURE     Status: Normal   Collection Time   04/19/12  8:38 PM      Component Value Range Status Comment   Specimen Description URINE, CLEAN CATCH   Final    Special Requests NONE   Final    Culture  Setup Time 04/20/2012 02:11   Final    Colony Count 10,000 COLONIES/ML   Final    Culture     Final    Value: Multiple bacterial morphotypes present, none predominant. Suggest appropriate recollection if clinically indicated.   Report Status 04/20/2012 FINAL   Final   CSF CULTURE     Status: Normal (Preliminary result)   Collection Time   04/21/12 11:15 AM      Component Value Range Status Comment   Specimen Description CSF   Final     Special Requests 2.8ML   Final    Gram Stain     Final    Value: CYTOSPIN WBC PRESENT, PREDOMINANTLY MONONUCLEAR     NO ORGANISMS SEEN     Performed at [facility]   Culture NO GROWTH 1 DAY   Final    Report Status PENDING   Incomplete   GRAM STAIN     Status: Normal   Collection Time   04/21/12 11:15 AM      Component Value Range Status Comment   Specimen Description CSF   Final    Special Requests 2.8ML   Final    Gram Stain     Final    Value: CYTOSPIN SLIDE:     WBC PRESENT, PREDOMINANTLY MONONUCLEAR     NO ORGANISMS SEEN   Report Status 04/21/2012 FINAL   Final   FUNGUS CULTURE W SMEAR     Status: Normal (Preliminary result)   Collection Time   04/21/12 11:15 AM      Component Value Range Status Comment   Specimen Description CSF   Final    Special Requests 2.8ML   Final    Fungal Smear NO YEAST OR FUNGAL ELEMENTS SEEN   Final    Culture CULTURE IN PROGRESS FOR FOUR WEEKS   Final    Report Status PENDING   Incomplete     Studies/Results: Dg Chest 2 View  04/21/2012  *RADIOLOGY REPORT*  Clinical Data: Fever, cough, back pain  CHEST - 2 VIEW  Comparison: 04/19/2012  Findings: Right lower lobe airspace disease noted which is posterior on the lateral view over the spine.  This is compatible with right lower lobe pneumonia.  No significant effusion.  Left lung remains clear.  Borderline heart size and vascularity. Trachea midline.  IMPRESSION: Posterior left lower lobe airspace process compatible with pneumonia recommend radiographic follow-up to document complete resolution  Original Report Authenticated By: Judie Petit. Ruel Favors, M.D.   Mr Laqueta Jean UX Contrast  04/20/2012  *RADIOLOGY REPORT*  Clinical Data: Severe headache.  Longstanding HIV.  MRI HEAD WITHOUT AND WITH CONTRAST  Technique:  Multiplanar, multiecho  pulse sequences of the brain and surrounding structures were obtained according to standard protocol without and with intravenous contrast  Contrast: 20mL MULTIHANCE  GADOBENATE DIMEGLUMINE 529 MG/ML IV SOLN  Comparison: Head CT 04/19/2012  Findings: The brain shows low-level T2 signal within the white matter diffusely, likely indicative of HIV infection of brain. There is no evidence of old or acute focal infarction, mass lesion, hemorrhage, hydrocephalus or extra-axial collection.  FLAIR imaging shows increased signal within the sulci near the vertex.  This can be seen with increased protein content of CSF. After contrast administration, there is low-level leptomeningeal enhancement.  These findings raise the possibility of meningitis.  There are inflammatory changes of the paranasal sinuses.  There is fluid in the mastoid air cells more on the left than the right. There are multiple Lymphoepithelial cysts of the parotid glands. There is prominence of the nasopharyngeal lymphoid tissue.  There is low signal of the cervical spinal vertebral marrow.  All of these findings are typical of HIV infection.  There are some small metallic objects in the right posterior parietal scalp.  IMPRESSION: Increased FLAIR signal within the sulci near the vertex. Leptomeningeal contrast enhancement.  These findings raise the possibility of meningitis.  Low-level T2 signal within the cerebral hemispheric white matter that could indicate HIV infection of brain. No focal brain pathology.  Paranasal sinus inflammation, Lymphoepithelial cysts of the parotid glands, hypointense marrow signal and prominent nasopharyngeal lymphoid tissue, findings frequently seen in HIV infection.  Original Report Authenticated By: Thomasenia Sales, M.D.   Dg Fluoro Guide Lumbar Puncture  04/21/2012  *RADIOLOGY REPORT*  Clinical Data: Immunocompromised.  Fever.  Headaches.  LUMBAR PUNCTURE FLUORO GUIDE  Comparison: None.  Findings: The procedure and associated risks were reviewed with the patient through an interpreter.  Questions answered.  Written as well as oral witnessed consent obtained.  Under fluoroscopic guidance  and aseptic technique, an L3-4 lumbar puncture was performed with a single pass of a 20-gauge spinal needle.  Opening pressure 17 ml border.  11 ml clear cerebrospinal fluid collected and sent for labs as per request.  No immediate complications.  Postprocedure instructions reviewed with the patient.  IMPRESSION: Successful lumbar puncture with opening pressure of 17 ml of water. 11 ml of clear cerebrospinal fluid collected and sent to lab as per request.  Original Report Authenticated By: Fuller Canada, M.D.    Assessment: Mrs. Pain is improving on broad empiric therapy for sinusitis and meningitis. All cultures are negative and her likely to remain so June 2 having been on antibiotics before the LP. It now appears that she might also have right lower lobe pneumonia. Given the evidence for meningitis she needs approximately 10 days of total therapy but I believe that a reasonable option is to switch to oral moxifloxacin to complete her therapy. It has excellent activity against pneumococcus and meningococcus as well as offering excellent empiric coverage for pneumonia and sinusitis.  It sounds like she may have had a red man reaction to vancomycin.she tells me that she has some minor problems with itching at home that she thinks may be due to one of her antiretroviral medications but that it is not very bothersome and does not prevent her from taking her medications. She reiterates that she has tried very hard to not miss doses. Her repeat viral load is pending.  Patients with HIV infection can have a wide variety of serum protein abnormalities. Usually these are asymptomatic and benign.  Plan: 1. Recommend  changing antibiotic therapy to moxifloxacin 400 mg by mouth daily x1 more week 2. Continue current antiretroviral regimen pending repeat viral load  Cliffton Asters, MD [facility] [phone] pager   [phone] cell 04/22/2012, 4:21 PM

## 2012-04-22 NOTE — Progress Notes (Addendum)
Subjective: Alison Chambers reports feeling much better today.  She reports her cough and headache have resolved.  She has not dyspnea, abdominal pain, or chest pain.  Objective: Vital signs in last 24 hours: Temp:  [98.1 F (36.7 C)-99.6 F (37.6 C)] 98.7 F (37.1 C) (07/09 1004) Pulse Rate:  [89-110] 92  (07/09 1004) Resp:  [20-27] 25  (07/09 1004) BP: (110-129)/(66-75) 111/66 mmHg (07/09 0321) SpO2:  [97 %-99 %] 98 % (07/09 1004) Weight change:  Last BM Date: 04/20/12  Intake/Output from previous day: 07/08 0701 - 07/09 0700 In: 3195 [P.O.:1200; I.V.:1795; IV Piggyback:200] Out: 1075 [Urine:1075] Intake/Output this shift:    General appearance: alert, cooperative and no distress Resp: clear to auscultation bilaterally Cardio: regular rate and rhythm, S1, S2 normal, no murmur, click, rub or gallop GI: soft, non-tender; bowel sounds normal; no masses,  no organomegaly Extremities: no edema, redness or tenderness in the calves or thighs  Lab Results:  CSF CELL COUNT WITH DIFFERENTIAL     Status: Abnormal      Component Value Range   Tube # 1     Color, CSF COLORLESS  COLORLESS   Appearance, CSF CLEAR  CLEAR   Supernatant NOT INDICATED     RBC Count, CSF 0  0 /cu mm   WBC, CSF 18 (*) 0 - 5 /cu mm   Segmented Neutrophils-CSF 0  0 - 6 %   Lymphs, CSF 91 (*) 40 - 80 %   Monocyte-Macrophage-Spinal Fluid 9 (*) 15 - 45 %  CSF CULTURE     Status: Normal (Preliminary result)   Gram Stain CYTOSPIN WBC PRESENT, PREDOMINANTLY MONONUCLEAR     NO ORGANISMS SEEN     Performed at [facility]   Culture NO GROWTH 1 DAY    GRAM STAIN     Status: Normal  CRYPTOCOCCAL ANTIGEN, CSF     Status: Normal      Component Value Range   Crypto Ag NEGATIVE  NEGATIVE  PROTEIN AND GLUCOSE, CSF     Status: Abnormal      Component Value Range   Glucose, CSF 65  43 - 76 mg/dL   Total  Protein, CSF 48 (*) 15 - 45 mg/dL  BASIC METABOLIC PANEL     Status: Abnormal   04/22/12  4:15 AM      Component  Value Range   Sodium 136  135 - 145 mEq/L   Potassium 3.0 (*) 3.5 - 5.1 mEq/L   Chloride 104  96 - 112 mEq/L   CO2 23  19 - 32 mEq/L   Glucose, Bld 96  70 - 99 mg/dL   BUN 4 (*) 6 - 23 mg/dL   Creatinine, Ser 8.29  0.50 - 1.10 mg/dL   Calcium 8.1 (*) 8.4 - 10.5 mg/dL   GFR calc non Af Amer >90  >90 mL/min   GFR calc Af Amer >90  >90 mL/min  CBC WITH DIFFERENTIAL     Status: Abnormal   04/22/12  4:15 AM      Component Value Range   WBC 7.9  4.0 - 10.5 K/uL   RBC 3.16 (*) 3.87 - 5.11 MIL/uL   Hemoglobin 8.8 (*) 12.0 - 15.0 g/dL   HCT 56.2 (*) 13.0 - 86.5 %   MCV 81.6  78.0 - 100.0 fL   MCH 27.8  26.0 - 34.0 pg   MCHC 34.1  30.0 - 36.0 g/dL   RDW 78.4  69.6 - 29.5 %  Platelets 227  150 - 400 K/uL   Neutrophils Relative 84 (*) 43 - 77 %   Neutro Abs 6.6  1.7 - 7.7 K/uL   Lymphocytes Relative 11 (*) 12 - 46 %   Lymphs Abs 0.8  0.7 - 4.0 K/uL   Monocytes Relative 5  3 - 12 %   Monocytes Absolute 0.4  0.1 - 1.0 K/uL   Eosinophils Relative 0  0 - 5 %   Eosinophils Absolute 0.0  0.0 - 0.7 K/uL   Basophils Relative 0  0 - 1 %   Basophils Absolute 0.0  0.0 - 0.1 K/uL  PHOSPHORUS     Status: Abnormal   04/22/12  4:15 AM      Component Value Range   Phosphorus 1.9 (*) 2.3 - 4.6 mg/dL  MAGNESIUM     Status: Normal   04/22/12  4:15 AM      Component Value Range   Magnesium 1.7  1.5 - 2.5 mg/dL   Studies/Results: Ct Head Wo Contrast 04/19/2012 IMPRESSION: Normal head CT  Mr Laqueta Jean Wo Contrast 04/20/2012  IMPRESSION: Increased FLAIR signal within the sulci near the vertex. Leptomeningeal contrast enhancement.  These findings raise the possibility of meningitis.  Low-level T2 signal within the cerebral hemispheric white matter that could indicate HIV infection of brain. No focal brain pathology.  Paranasal sinus inflammation, Lymphoepithelial cysts of the parotid glands, hypointense marrow signal and prominent nasopharyngeal lymphoid tissue, findings frequently seen in HIV infection.   Portable  CXR  04/19/2012 IMPRESSION: No acute findings   2-View CXR  04/21/2012  IMPRESSION: Posterior left lower lobe airspace process compatible with  pneumonia recommend radiographic follow-up to document complete resolution   Medications:  I have reviewed the patient's current medications. Scheduled:    . antiseptic oral rinse  15 mL Mouth Rinse BID  . dapsone  100 mg Oral Daily  . diphenhydrAMINE  25 mg Intravenous UD  . emtricitabine-tenofovir  1 tablet Oral Daily  . fluconazole (DIFLUCAN) IV  200 mg Intravenous Q24H  . meropenem (MERREM) IV  2 g Intravenous Q8H  . pneumococcal 23 valent vaccine  0.5 mL Intramuscular Tomorrow-1000  . potassium chloride  40 mEq Oral Once  . potassium chloride  40 mEq Oral Once  . raltegravir  400 mg Oral BID  . sodium chloride      . traZODone  100 mg Oral QHS  . vancomycin  1,000 mg Intravenous Q8H  . DISCONTD: fluconazole (DIFLUCAN) IV  400 mg Intravenous Q24H  . DISCONTD: vancomycin  1,000 mg Intravenous Q8H    ZOX:[id number], benzonatate, diphenhydrAMINE, ibuprofen, morphine injection, ondansetron (ZOFRAN) IV, oxyCODONE  Assessment/Plan:  This is a 26 year old female with HIV and a history of chronic OM who presented with fever, cough, and coryza. She has also endorsed neck pain, photophobia, and a headache.   1. Sepsis: Patient met SIRS criteria and has a source of infection. Head CT confirmed sinus inflammation but history and exam were concerning for meningitis as well. Broad coverage was initiated with vancomycin and meropenem. One dose of doxycycline was given, but ID was certain this was not RMSF.  An was LP attempted overnigtht on 7/7 and was unsuccessful.  IR performed a successful LP on 7/8.  Initial analysis of the CSF was consistent with a viral meningitis.  Admission CXR was not suspicious for pneumonia; but after hydration, a CXR on 7/8 was concerning for a right lower lobe pneumonia.  Urinary S. pneumo antigen was  negative. Cryptococcus  antigen was negative, and ID suggested stopping fungal coverage.   Blood cultures pending, NGTD Continue meropenem, vancomycin and fluconazole for now Consult ID again today for guidance on antimicrobial treatments given the pneumonia and meningitis Legionella antigen pending Daily CBC  Symptomatic treatment  2. Dehydration: Patient appeared dehydrated on admission. BMET demonstrated hyponatremia and a calculated serum Osmo of 262. FeNa was 0.33% consistent with hypovolemia. Patient received amost 2L NS in ED.  Patients urinary output has been more than sufficient for the past 24 hours and her oral intake has increased.  Her potassium was low this morning, likely secondary from the fluid resuscitation she required. Discontinue IVF Daily BMET PO KCl once  3. AIDS: CD4 count was 90 and viral load was 16109 in January 2013. She has been on HAART, but does not consistently take the medications.  CD4 now 100. Viral load levels pending Continue on HAART Started Dapsone 100mg  daily for PCP prophylaxis  4. Chronic Otitis media: The patient has been treated for recurrent bilateral ear infection and recently she underwent myringotomy with bilateral tympanostomy tubes in place. She has no acute ear pain and no tenderness is noted. No external ear discharge. Denies hearing loss.   5. Protein gap: Total protein 10.0, albumin 2.7. This discordance has been present in the past (9.1/2.5 in April).  Repeat on 7/8 was TP 8.3, Alb 2.1. ESR 125, CRP 30.4. UPEP, SPEP pending  6. Disposition:  Inpatient admission remains a requirement     LOS: 3 days   Lollie Sails 04/22/2012, 10:17 AM

## 2012-04-23 DIAGNOSIS — J189 Pneumonia, unspecified organism: Secondary | ICD-10-CM

## 2012-04-23 LAB — BASIC METABOLIC PANEL
BUN: 3 mg/dL — ABNORMAL LOW (ref 6–23)
Chloride: 102 mEq/L (ref 96–112)
Creatinine, Ser: 0.62 mg/dL (ref 0.50–1.10)
GFR calc Af Amer: >90 mL/min (ref >90)
GFR calc non Af Amer: >90 mL/min (ref >90)
Potassium: 3.1 mEq/L — ABNORMAL LOW (ref 3.5–5.1)

## 2012-04-23 LAB — UIFE/LIGHT CHAINS/TP QN, 24-HR UR
Alpha 1, Urine: DETECTED — AB
Free Kappa Lt Chains,Ur: 64.5 mg/dL — ABNORMAL HIGH (ref 0.14–2.42)
Free Kappa/Lambda Ratio: 9.28 ratio (ref 2.04–10.37)
Free Lambda Lt Chains,Ur: 6.95 mg/dL — ABNORMAL HIGH (ref 0.02–0.67)
Gamma Globulin, Urine: DETECTED — AB
Total Protein, Urine: 82.3 mg/dL

## 2012-04-23 LAB — CBC WITH DIFFERENTIAL/PLATELET
Basophils Absolute: 0 10*3/uL (ref 0.0–0.1)
Basophils Relative: 0 % (ref 0–1)
Eosinophils Absolute: 0 10*3/uL (ref 0.0–0.7)
Hemoglobin: 9.4 g/dL — ABNORMAL LOW (ref 12.0–15.0)
MCH: 26.9 pg (ref 26.0–34.0)
MCHC: 33 g/dL (ref 30.0–36.0)
Monocytes Absolute: 0.4 10*3/uL (ref 0.1–1.0)
Monocytes Relative: 8 % (ref 3–12)
Neutro Abs: 3.4 10*3/uL (ref 1.7–7.7)
Neutrophils Relative %: 72 % (ref 43–77)
RDW: 15.4 % (ref 11.5–15.5)

## 2012-04-23 MED ORDER — DAPSONE 100 MG PO TABS: 100.0000 mg | ORAL_TABLET | Freq: Every day | ORAL | Status: DC

## 2012-04-23 MED ORDER — POTASSIUM CHLORIDE CRYS ER 20 MEQ PO TBCR
40.0000 meq | EXTENDED_RELEASE_TABLET | Freq: Once | ORAL | Status: AC
  Administered 2012-04-23: 40 meq via ORAL
  Filled 2012-04-23: qty 2

## 2012-04-23 MED ORDER — MOXIFLOXACIN HCL 400 MG PO TABS: 400.0000 mg | ORAL_TABLET | Freq: Every day | ORAL | Status: AC

## 2012-04-23 NOTE — Progress Notes (Signed)
Subjective: Patient is feeling well this morning and is without complaint.  She endorsed nausea and vomiting with the HAART pill she takes at home.  She denies chest pain, headache, dyspnea, and abdominal pain.  Objective: Vital signs in last 24 hours: Temp:  [96.9 F (36.1 C)-99.1 F (37.3 C)] 96.9 F (36.1 C) (07/10 0631) Pulse Rate:  [88-106] 96  (07/10 0631) Resp:  [16-25] 18  (07/10 0631) BP: (116-136)/(74-81) 136/74 mmHg (07/10 0631) SpO2:  [98 %-100 %] 100 % (07/10 0631) Weight change:  Last BM Date: 04/22/12  Intake/Output from previous day: 07/09 0701 - 07/10 0700 In: 540 [P.O.:240; IV Piggyback:300] Out: -   Physical Exam: General appearance: alert, cooperative and no distress Resp: diminished breath sounds RLL Cardio: regular rate and rhythm, S1, S2 normal, no murmur, click, rub or gallop GI: soft, non-tender; bowel sounds normal; no masses,  no organomegaly  Lab Results: Basename 04/23/12 0600 04/22/12 0415  WBC 4.7 7.9  HGB 9.4* 8.8*  HCT 28.5* 25.8*  PLT 263 227   BMET Basename 04/22/12 0415 04/21/12 0430  NA 136 137  K 3.0* 3.2*  CL 104 105  CO2 23 25  GLUCOSE 96 112*  BUN 4* 6  CREATININE 0.52 0.60  CALCIUM 8.1* 8.1*   Medications:  I have reviewed the patient's current medications. Scheduled: . antiseptic oral rinse  15 mL Mouth Rinse BID  . dapsone  100 mg Oral Daily  . diphenhydrAMINE  25 mg Intravenous UD  . emtricitabine-tenofovir  1 tablet Oral Daily  .  morphine injection  1 mg Intravenous Once  . moxifloxacin  400 mg Oral Q2000  . pneumococcal 23 valent vaccine  0.5 mL Intramuscular Tomorrow-1000  . raltegravir  400 mg Oral BID  . traZODone  100 mg Oral QHS  . DISCONTD: fluconazole (DIFLUCAN) IV  200 mg Intravenous Q24H  . DISCONTD: meropenem (MERREM) IV  2 g Intravenous Q8H  . DISCONTD: vancomycin  1,000 mg Intravenous Q8H   PRN:  acetaminophen, benzonatate, diphenhydrAMINE, ibuprofen, ondansetron (ZOFRAN) IV,    Assessment/Plan:   This is a 26 year old female with HIV and a history of chronic OM who presented with fever, cough, and coryza. She has also endorsed neck pain, photophobia, and a headache.   1. Sepsis: Patient met SIRS criteria and has a source of infection. Head CT confirmed sinus inflammation but history and exam were concerning for meningitis as well. Broad coverage was initiated with vancomycin and meropenem. One dose of doxycycline was given, but ID was certain this was not RMSF. An was LP attempted overnigtht on 7/7 and was unsuccessful. IR performed a successful LP on 7/8. Initial analysis of the CSF was consistent with a viral meningitis. Admission CXR was not suspicious for pneumonia; but after hydration, a CXR on 7/8 was concerning for a right lower lobe pneumonia. Urinary S. pneumo antigen was negative. Cryptococcus antigen was negative, and ID suggested stopping fungal coverage.  Blood cultures pending, NGTD  CSF cultures pending, NGTD Remaining CSF to cytology Transitioned to oral moxifloxacin per ID recs Legionella antigen negative Daily CBC  Symptomatic treatment  2. Dehydration: Patient appeared dehydrated on admission. BMET demonstrated hyponatremia and a calculated serum Osmo of 262. FeNa was 0.33% consistent with hypovolemia. Patient received amost 2L NS in ED. Patients urinary output has been more than sufficient for the past 24 hours and her oral intake has increased. Her potassium was low this morning, likely secondary from the fluid resuscitation she required.  Daily  BMET  PO KCl if low  3. AIDS: CD4 count was 90 and viral load was 16109 in January 2013. She has been on HAART, but does not consistently take the medications. CD4 now 100.  Viral load levels pending  Continue on HAART  Continue Dapsone 100mg  daily for PCP prophylaxis  4. Chronic Otitis media: The patient has been treated for recurrent bilateral ear infection and recently she underwent myringotomy with bilateral  tympanostomy tubes in place. She has no acute ear pain and no tenderness is noted. No external ear discharge. Denies hearing loss.   5. Protein gap: Total protein 10.0, albumin 2.7. This discordance has been present in the past (9.1/2.5 in April). Repeat on 7/8 was TP 8.3, Alb 2.1. ESR 125, CRP 30.4.  SPEP with elevated gamma globulin. UPEP pending  6. Disposition: discharge home today; SW & CM consulted for financial assistance.   LOS: 4 days    Signed by:  Dorthula Rue. Earlene Plater, MD PGY-I, Internal Medicine Pager [phone]  04/23/2012, 7:41 AM

## 2012-04-23 NOTE — Progress Notes (Signed)
Patient Alison Chambers, 26 year old Hispanic female and her husband wanted time with the Chaplain as they prepare to go home later today.  Patient and her husband thanked Orthoptist for providing pastoral prayer, presence, and conversation.  No follow-up needed.

## 2012-04-23 NOTE — Progress Notes (Signed)
Clinical Social Work-CSW provided pt with referral to Financial Counseling in regards to her concerns about bill/payment-CSW discussed [facility] funds/med management with CM who was able to provide support and assistance. MD aware and CSW will follow up with pt post d/c to ensure that pt aware of process of financial assistance- Jodean Lima, [phone]

## 2012-04-23 NOTE — Progress Notes (Signed)
Pt discharged to home with husband. All medications and follow up appointments reviewed. All questions answered. Elmer Sow, RN

## 2012-04-23 NOTE — Progress Notes (Signed)
Patient ID: Alison Chambers, female   DOB: 14-Nov-1985, 26 y.o.   MRN: [id number]    [facility]    Date of Admission:  04/19/2012   Day 4 antibiotics  Principal Problem:  *SIRS (systemic inflammatory response syndrome) Active Problems:  Fever  Acute sinusitis  Headache  HIV INFECTION  DEPRESSION  Chronic otitis media  Normocytic anemia  Protein malnutrition  Meningitis  Pneumonia      . antiseptic oral rinse  15 mL Mouth Rinse BID  . dapsone  100 mg Oral Daily  . diphenhydrAMINE  25 mg Intravenous UD  . emtricitabine-tenofovir  1 tablet Oral Daily  .  morphine injection  1 mg Intravenous Once  . moxifloxacin  400 mg Oral Q2000  . pneumococcal 23 valent vaccine  0.5 mL Intramuscular Tomorrow-1000  . potassium chloride  40 mEq Oral Once  . raltegravir  400 mg Oral BID  . traZODone  100 mg Oral QHS  . DISCONTD: fluconazole (DIFLUCAN) IV  200 mg Intravenous Q24H  . DISCONTD: meropenem (MERREM) IV  2 g Intravenous Q8H  . DISCONTD: vancomycin  1,000 mg Intravenous Q8H    Subjective: She is feeling much better.  Objective: Temp:  [96.9 F (36.1 C)-99.1 F (37.3 C)] 96.9 F (36.1 C) (07/10 0631) Pulse Rate:  [88-106] 96  (07/10 0631) Resp:  [16-22] 18  (07/10 0631) BP: (116-136)/(74-81) 136/74 mmHg (07/10 0631) SpO2:  [100 %] 100 % (07/10 0631)  General: Alert, comfortable and smiling Next: Supple Lungs: distant breath sounds   Lab Results HIV 1 RNA Quant (copies/mL)  Date Value  04/20/2012 72440*  10/26/2011 21799*  05/03/2011 15400*     CD4 T Cell Abs (cmm)  Date Value  04/20/2012 100*  10/26/2011 90*  05/03/2011 100*    Assessment: She is much better on empiric therapy for  Pneumonia, sinusitis and meningitis. We will complete therapy with 7 more days of moxifloxacin.  Unfortunately her viral load has actually gone up on her current antiretroviral regimen. She states that she's forgotten to take her evening dose of Isentress on 3  occasions in the past month. She will need genotype and integrase resistance testing to help craft a new salvage regimen.  Plan: 1. Discharge home on moxifloxacin 2. Genotype and integrase rersistance testing 3. [facility] f/u soon  Cliffton Asters, MD [facility] [phone] pager   [phone] cell 04/23/2012, 10:56 AM      He is a

## 2012-04-23 NOTE — Care Management Note (Signed)
    Page 1 of 1   04/23/2012     4:28:36 PM   CARE MANAGEMENT NOTE 04/23/2012  Patient:  Alison Chambers, Alison Chambers   Account Number:  [id number]  Date Initiated:  04/23/2012  Documentation initiated by:  Onnie Boer  Subjective/Objective Assessment:   PT WAS ADMITTED WITH SIRS     Action/Plan:   PROGRESSION OF CARE AND DISCHARGE PLANNING   Anticipated DC Date:  04/23/2012   Anticipated DC Plan:  HOME/SELF CARE      DC Planning Services  CM consult  Medication Assistance      Choice offered to / List presented to:             Status of service:  Completed, signed off Medicare Important Message given?   (If response is "NO", the following Medicare IM given date fields will be blank) Date Medicare IM given:   Date Additional Medicare IM given:    Discharge Disposition:  HOME/SELF CARE  Per UR Regulation:  Reviewed for med. necessity/level of care/duration of stay  If discussed at Long Length of Stay Meetings, dates discussed:    Comments:  04/23/12 Onnie Boer, RN, BSN 1627 PT WAS ADMITTED WITH SIRS AND WAS DC'D TO HOME WITH MEDS FROM PHARMACY THROUGH THE [facility] FUND.
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
## 2012-04-24 LAB — CSF CULTURE W GRAM STAIN: Culture: NO GROWTH

## 2012-04-26 LAB — CULTURE, BLOOD (ROUTINE X 2): Culture: NO GROWTH

## 2012-05-01 ENCOUNTER — Encounter: Payer: Self-pay | Admitting: Internal Medicine

## 2012-05-01 LAB — HIV-1 GENOTYPR PLUS

## 2012-05-05 ENCOUNTER — Ambulatory Visit: Payer: Self-pay

## 2012-05-05 ENCOUNTER — Ambulatory Visit (INDEPENDENT_AMBULATORY_CARE_PROVIDER_SITE_OTHER): Payer: MEDICAID | Admitting: Internal Medicine

## 2012-05-05 ENCOUNTER — Encounter: Payer: Self-pay | Admitting: Internal Medicine

## 2012-05-05 VITALS — BP 119/81 | HR 92 | Temp 98.7°F | Wt 221.0 lb

## 2012-05-05 DIAGNOSIS — B20 Human immunodeficiency virus [HIV] disease: Secondary | ICD-10-CM

## 2012-05-05 DIAGNOSIS — Z21 Asymptomatic human immunodeficiency virus [HIV] infection status: Secondary | ICD-10-CM

## 2012-05-05 MED ORDER — DAPSONE 100 MG PO TABS: 100.0000 mg | ORAL_TABLET | Freq: Every day | ORAL | Status: AC

## 2012-05-05 MED ORDER — RITONAVIR 100 MG PO TABS: 100.0000 mg | ORAL_TABLET | Freq: Every day | ORAL | Status: DC

## 2012-05-05 MED ORDER — EMTRICITABINE-TENOFOVIR DF 200-300 MG PO TABS: 1.0000 | ORAL_TABLET | Freq: Every day | ORAL | Status: DC

## 2012-05-05 MED ORDER — DARUNAVIR ETHANOLATE 400 MG PO TABS: 400.0000 mg | ORAL_TABLET | Freq: Two times a day (BID) | ORAL | Status: DC

## 2012-05-06 ENCOUNTER — Inpatient Hospital Stay: Payer: Self-pay | Admitting: Internal Medicine

## 2012-05-08 ENCOUNTER — Ambulatory Visit (INDEPENDENT_AMBULATORY_CARE_PROVIDER_SITE_OTHER): Payer: Self-pay | Admitting: Internal Medicine

## 2012-05-08 ENCOUNTER — Ambulatory Visit: Payer: Self-pay

## 2012-05-08 ENCOUNTER — Encounter: Payer: Self-pay | Admitting: Internal Medicine

## 2012-05-08 ENCOUNTER — Encounter: Payer: Self-pay | Admitting: Licensed Clinical Social Worker

## 2012-05-08 VITALS — BP 123/83 | HR 85 | Temp 97.5°F | Ht 66.0 in | Wt 224.2 lb

## 2012-05-08 DIAGNOSIS — J019 Acute sinusitis, unspecified: Secondary | ICD-10-CM

## 2012-05-08 DIAGNOSIS — F329 Major depressive disorder, single episode, unspecified: Secondary | ICD-10-CM

## 2012-05-08 DIAGNOSIS — R51 Headache: Secondary | ICD-10-CM

## 2012-05-08 DIAGNOSIS — J189 Pneumonia, unspecified organism: Secondary | ICD-10-CM

## 2012-05-08 DIAGNOSIS — R6510 Systemic inflammatory response syndrome (SIRS) of non-infectious origin without acute organ dysfunction: Secondary | ICD-10-CM

## 2012-05-08 DIAGNOSIS — F3289 Other specified depressive episodes: Secondary | ICD-10-CM

## 2012-05-08 DIAGNOSIS — D649 Anemia, unspecified: Secondary | ICD-10-CM

## 2012-05-08 DIAGNOSIS — B20 Human immunodeficiency virus [HIV] disease: Secondary | ICD-10-CM

## 2012-05-08 DIAGNOSIS — E46 Unspecified protein-calorie malnutrition: Secondary | ICD-10-CM

## 2012-05-08 DIAGNOSIS — Z9189 Other specified personal risk factors, not elsewhere classified: Secondary | ICD-10-CM

## 2012-05-08 DIAGNOSIS — G039 Meningitis, unspecified: Secondary | ICD-10-CM

## 2012-05-08 LAB — CBC WITH DIFFERENTIAL/PLATELET
Eosinophils Relative: 1 % (ref 0–5)
Lymphs Abs: 0.9 10*3/uL (ref 0.7–4.0)
MCH: 26.8 pg (ref 26.0–34.0)
Neutrophils Relative %: 46 % (ref 43–77)
Platelets: 338 10*3/uL (ref 150–400)
RBC: 4.11 MIL/uL (ref 3.87–5.11)
RDW: 16 % — ABNORMAL HIGH (ref 11.5–15.5)

## 2012-05-08 LAB — COMPLETE METABOLIC PANEL WITH GFR
ALT: 21 U/L (ref 0–35)
Albumin: 3.4 g/dL — ABNORMAL LOW (ref 3.5–5.2)
BUN: 10 mg/dL (ref 6–23)
CO2: 23 mEq/L (ref 19–32)
Calcium: 8.9 mg/dL (ref 8.4–10.5)
Chloride: 103 mEq/L (ref 96–112)
Creat: 0.61 mg/dL (ref 0.50–1.10)
GFR, Est African American: >89 mL/min
Potassium: 4.3 mEq/L (ref 3.5–5.3)

## 2012-05-08 MED ORDER — SERTRALINE HCL 50 MG PO TABS
50.0000 mg | ORAL_TABLET | Freq: Every day | ORAL | Status: DC
Start: 2012-05-08 — End: 2012-08-18

## 2012-05-08 NOTE — Progress Notes (Signed)
Alison Chambers was referred to CSW for medication assistance.  Pt reported to PCP that she did not have access/funds for her medication and has not been taking.  CSW placed call to [employer], pt is known to agency and case manager states pt is able to get medications for free.  CSW met with Alison Chambers and interpreter.  CSW informed pt to re-establish with [facility] case management.  Once pt schedules appt, she can receives case management services to assist her with obtaining her medications and any identification that may be needed.  As, pt voiced concern to PCP that she must to to [place] to obtain requested ID.  [facility] states case management will be able to assist, pt needs to re-establish with their services.  CSW provided pt with the Spanish brochure of [facility] and contact number to call to schedule appt.  CSW inquired if pt wanted to completed call and appt while interpreter was there, pt declined.  CSW inquired if pt was going to her scheduled appt with counselor at [facility] clinic, pt states she needs to reschedule that appt.  CSW offered to utilized interpreter to reschedule appt, pt declined.  CSW provided Alison Chambers with contact information, and indicated CSW can return calls using Language Line and encouraged Alison Chambers to contact CSW if add'l problems should arise.

## 2012-05-08 NOTE — Progress Notes (Signed)
Interpreter Alison Chambers for Alison Chambers

## 2012-05-08 NOTE — Patient Instructions (Signed)
--  Start taking sertraline 50mg  for depression.  --Please go to your therapist appointment. --Follow up here in 2 weeks to discuss possible side effects. --Follow up with the ID clinic appointment on 05/14/12 at 10AM.

## 2012-05-10 LAB — HIV-1 INTEGRASE GENOTYPE
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
## 2012-05-13 NOTE — Progress Notes (Signed)
  Subjective:    Patient ID: Alison Chambers, female    DOB: 19-Jul-1986, 26 y.o.   MRN: [id number]  HPI Alison Chambers is a 26 year-old Spanish speaking woman with PMH significant for HIV-AIDS, depression, and normocytic anemia who comes in today, accompanied by a Spanish interpreter, for follow up from her hospital discharge on 7/9 for SIRS 2/2 RLL PNA. Her CD4 count during her hospital stay was 100. She tells me she has not taken her HIV medications for weeks because she was told by someone in the infectious disease clinic that she needed to renew her ID in [place] in order to continue receiving her HIV medications for free. She finished her course of moxifloxacin, however, she has not been taking any other medications since her hospital discharge.  Today she reports feeling increasingly depressed and sad, pending days at her bedroom. She also reports anhedonia, sleep difficulty, decreased appetite, excessive feelings of guilt, racing thoughts, decreased concentration. These symptoms have been present for 8 months and have progressively worsened. She denies SI/HI.   She is currently trying to file for divorce because she feels like her husband should be with another woman, someone "better" than her. Her husband, she reports, fully supports her in all her needs, never argues with her, and refuses to file for divorce. She feels safe in her home. Her 3 children ages,  49, 37, and 96 do not know of her HIV status.   She denied having any other symptoms and had not other complaints today.    Review of Systems  Constitutional: Positive for appetite change and fatigue. Negative for fever, chills, diaphoresis, activity change and unexpected weight change.  Eyes: Negative for visual disturbance.  Respiratory: Negative for cough, chest tightness and shortness of breath.   Cardiovascular: Negative for chest pain and palpitations.  Gastrointestinal: Negative for nausea, vomiting, abdominal pain, diarrhea and blood  in stool.  Genitourinary: Negative for difficulty urinating.  Musculoskeletal: Negative for back pain.  Skin: Negative for color change.  Neurological: Negative for dizziness, tremors, syncope, speech difficulty, weakness, light-headedness, numbness and headaches.  Hematological: Negative for adenopathy.  Psychiatric/Behavioral: Positive for disturbed wake/sleep cycle and decreased concentration. Negative for suicidal ideas, hallucinations, behavioral problems, confusion, self-injury, dysphoric mood and agitation. The patient is not nervous/anxious and is not hyperactive.        Objective:   Physical Exam  Constitutional: She is oriented to person, place, and time. She appears well-developed and well-nourished. No distress.  HENT:  Head: Normocephalic and atraumatic.  Eyes: Conjunctivae are normal. Right eye exhibits no discharge. Left eye exhibits no discharge. No scleral icterus.  Neck: Neck supple.  Cardiovascular: Normal rate, regular rhythm, normal heart sounds and intact distal pulses.   Pulmonary/Chest: Effort normal and breath sounds normal. No respiratory distress. She has no wheezes. She has no rales. She exhibits no tenderness.  Musculoskeletal: She exhibits no edema.  Lymphadenopathy:    She has no cervical adenopathy.  Neurological: She is alert and oriented to person, place, and time.  Skin: Skin is warm and dry. She is not diaphoretic. No erythema.  Psychiatric: Her behavior is normal.       Depressed affect, mood is "don't feel anything"          Assessment & Plan:

## 2012-05-13 NOTE — Assessment & Plan Note (Signed)
Pt talked to CSW, Lynnae January. Pt has coverage for free HAART medication. Advised pt to follow up with ID clinic to resume HAART ASAP. Pt advised to continue dapsone for PCP prophylaxis, her husband to provide funds for this medication.

## 2012-05-13 NOTE — Assessment & Plan Note (Signed)
Pt denies ha, stiff neck today. Stable. CSF cx negative.

## 2012-05-13 NOTE — Assessment & Plan Note (Signed)
Normal BP, HR, and temperature today.
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
## 2012-05-13 NOTE — Progress Notes (Signed)
agree

## 2012-05-13 NOTE — Assessment & Plan Note (Signed)
Pt with reported decreased appetite, likely 2/2 depression. Will follow up on depression treatment and assess need for protein supplementation during next visit. Pt was overwhelmed today, will try to address one problem at a time.

## 2012-05-13 NOTE — Assessment & Plan Note (Signed)
No complaints today.

## 2012-05-13 NOTE — Assessment & Plan Note (Signed)
This might be 2/2 depression. Will treat depression first as reassess insomnia sxs.

## 2012-05-13 NOTE — Assessment & Plan Note (Signed)
Will recheck CBC today. Pt normocytic.

## 2012-05-13 NOTE — Assessment & Plan Note (Signed)
Pt reports no sinus pain today, no rhinorrhea.

## 2012-05-13 NOTE — Assessment & Plan Note (Signed)
Pt denies cough, fever/chills. Will order repeat CXR during next office visit.

## 2012-05-14 ENCOUNTER — Ambulatory Visit: Payer: Self-pay

## 2012-05-19 LAB — FUNGUS CULTURE W SMEAR

## 2012-07-10 ENCOUNTER — Telehealth: Payer: Self-pay

## 2012-07-10 NOTE — Telephone Encounter (Signed)
Pt called c/o she is having leg pain.  I offered an appointment for today with Dr [name] and she refused due to transportation issues.  The next availble appointment would be with Dr Ninetta Lights for 07-18-2012.    Pt requested the appointment in October and stated she will go to the emergency room when she gets transportation.    Laurell Josephs, RN

## 2012-07-15 ENCOUNTER — Telehealth: Payer: Self-pay | Admitting: *Deleted

## 2012-07-15 NOTE — Telephone Encounter (Signed)
Patient called today crying in pain advised her leg was still hurting (since call 07/08/12) and that now she has ulcers in the top of her mouth and it hurts to swallow. I tried to place her on hold to see if the doctor in house could work her in on his schedule and she advised that her brother was there and would take her to the ED. She advised she was in to much pain and for to long and needed to be seen now. Advised her will make a note in her chart. Also advised her of her appt 07/21/12 and if she finds she does not need it to call us back and cancel it please.

## 2012-07-16 ENCOUNTER — Encounter (HOSPITAL_COMMUNITY): Payer: Self-pay | Admitting: *Deleted

## 2012-07-16 ENCOUNTER — Emergency Department (HOSPITAL_COMMUNITY)
Admission: EM | Admit: 2012-07-16 | Discharge: 2012-07-16 | Disposition: A | Discharge status: Home / Self Care | Payer: Self-pay | Attending: Emergency Medicine | Admitting: Emergency Medicine

## 2012-07-16 DIAGNOSIS — B20 Human immunodeficiency virus [HIV] disease: Secondary | ICD-10-CM

## 2012-07-16 DIAGNOSIS — Z880 Allergy status to penicillin: Secondary | ICD-10-CM | POA: Insufficient documentation

## 2012-07-16 DIAGNOSIS — K0510 Chronic gingivitis, plaque induced: Secondary | ICD-10-CM | POA: Insufficient documentation

## 2012-07-16 DIAGNOSIS — Z885 Allergy status to narcotic agent status: Secondary | ICD-10-CM | POA: Insufficient documentation

## 2012-07-16 DIAGNOSIS — Z79899 Other long term (current) drug therapy: Secondary | ICD-10-CM | POA: Insufficient documentation

## 2012-07-16 DIAGNOSIS — Z882 Allergy status to sulfonamides status: Secondary | ICD-10-CM | POA: Insufficient documentation

## 2012-07-16 DIAGNOSIS — Z21 Asymptomatic human immunodeficiency virus [HIV] infection status: Secondary | ICD-10-CM | POA: Insufficient documentation

## 2012-07-16 MED ORDER — CLINDAMYCIN HCL 150 MG PO CAPS: 450.0000 mg | ORAL_CAPSULE | Freq: Three times a day (TID) | ORAL | Status: AC

## 2012-07-16 MED ORDER — IBUPROFEN 800 MG PO TABS: 800.0000 mg | ORAL_TABLET | Freq: Three times a day (TID) | ORAL | Status: DC | PRN

## 2012-07-16 MED ORDER — IBUPROFEN 800 MG PO TABS
800.0000 mg | ORAL_TABLET | Freq: Once | ORAL | Status: AC
  Administered 2012-07-16: 800 mg via ORAL
  Filled 2012-07-16: qty 1

## 2012-07-16 NOTE — ED Notes (Signed)
Pt reports pain to roof of mouth x3 weeks and worse in the past few days. Pt reports using percocet for pain without relief. Pt reports pain is worse with eating and drinking. Pt has a red bump noted to roof of mouth behind front teeth.  Pt reports this area feels raw. Pt has also tried topical OTC medications without relief.

## 2012-07-16 NOTE — ED Notes (Signed)
Pt reports front upper gum pain that gives her discomfort when she eats that started 3 days ago. Pt reports getting otc gel from pharmacy that was recommended for her pain with no relief.
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
## 2012-07-18 ENCOUNTER — Ambulatory Visit: Payer: Self-pay

## 2012-07-18 ENCOUNTER — Ambulatory Visit: Payer: Self-pay | Admitting: Infectious Diseases

## 2012-07-18 NOTE — ED Provider Notes (Signed)
Medical screening examination/treatment/procedure(s) were performed by non-physician practitioner and as supervising physician I was immediately available for consultation/collaboration.   Shayanne Gomm M Claxton Levitz, MD 07/18/12 1756 

## 2012-07-21 ENCOUNTER — Ambulatory Visit: Payer: Self-pay | Admitting: Infectious Diseases

## 2012-07-23 ENCOUNTER — Ambulatory Visit: Payer: Self-pay | Admitting: Infectious Diseases

## 2012-08-18 ENCOUNTER — Encounter: Payer: Self-pay | Admitting: Infectious Diseases

## 2012-08-18 ENCOUNTER — Inpatient Hospital Stay (HOSPITAL_COMMUNITY)
Admission: AD | Admit: 2012-08-18 | Discharge: 2012-08-18 | DRG: 159 | Disposition: A | Discharge status: Home / Self Care | Payer: MEDICAID | Source: Ambulatory Visit | Attending: Internal Medicine | Admitting: Internal Medicine

## 2012-08-18 ENCOUNTER — Ambulatory Visit (INDEPENDENT_AMBULATORY_CARE_PROVIDER_SITE_OTHER): Payer: Self-pay | Admitting: Infectious Diseases

## 2012-08-18 VITALS — BP 124/79 | HR 98 | Temp 98.0°F | Ht 66.0 in | Wt 230.0 lb

## 2012-08-18 DIAGNOSIS — J019 Acute sinusitis, unspecified: Secondary | ICD-10-CM

## 2012-08-18 DIAGNOSIS — K0889 Other specified disorders of teeth and supporting structures: Principal | ICD-10-CM | POA: Diagnosis present

## 2012-08-18 DIAGNOSIS — Z945 Skin transplant status: Secondary | ICD-10-CM

## 2012-08-18 DIAGNOSIS — Z23 Encounter for immunization: Secondary | ICD-10-CM

## 2012-08-18 DIAGNOSIS — Z21 Asymptomatic human immunodeficiency virus [HIV] infection status: Secondary | ICD-10-CM | POA: Diagnosis present

## 2012-08-18 DIAGNOSIS — B20 Human immunodeficiency virus [HIV] disease: Secondary | ICD-10-CM

## 2012-08-18 LAB — COMPREHENSIVE METABOLIC PANEL
ALT: 27 U/L (ref 0–35)
AST: 29 U/L (ref 0–37)
Albumin: 3.1 g/dL — ABNORMAL LOW (ref 3.5–5.2)
Alkaline Phosphatase: 60 U/L (ref 39–117)
CO2: 22 mEq/L (ref 19–32)
Chloride: 102 mEq/L (ref 96–112)
GFR calc non Af Amer: >90 mL/min (ref >90)
Potassium: 3.4 mEq/L — ABNORMAL LOW (ref 3.5–5.1)
Total Bilirubin: 0.2 mg/dL — ABNORMAL LOW (ref 0.3–1.2)

## 2012-08-18 LAB — CBC WITH DIFFERENTIAL/PLATELET
HCT: 34 % — ABNORMAL LOW (ref 36.0–46.0)
Hemoglobin: 11.6 g/dL — ABNORMAL LOW (ref 12.0–15.0)
Lymphocytes Relative: 31 % (ref 12–46)
Monocytes Absolute: 0.3 10*3/uL (ref 0.1–1.0)
Monocytes Relative: 9 % (ref 3–12)
Neutro Abs: 1.5 10*3/uL — ABNORMAL LOW (ref 1.7–7.7)
Neutrophils Relative %: 57 % (ref 43–77)

## 2012-08-18 MED ORDER — KETOROLAC TROMETHAMINE 30 MG/ML IJ SOLN: 30.0000 mg | Freq: Once | INTRAMUSCULAR | Status: DC

## 2012-08-18 MED ORDER — KETOROLAC TROMETHAMINE 10 MG PO TABS
10.0000 mg | ORAL_TABLET | Freq: Once | ORAL | Status: AC
  Administered 2012-08-18: 10 mg via ORAL
  Filled 2012-08-18: qty 1

## 2012-08-18 MED ORDER — LEVOFLOXACIN 750 MG PO TABS: 750.0000 mg | ORAL_TABLET | Freq: Every day | ORAL | Status: DC

## 2012-08-18 NOTE — Assessment & Plan Note (Signed)
Need to get her ADAP status straightened out. Get her back on her ART.
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
## 2012-08-18 NOTE — Assessment & Plan Note (Signed)
Her LLE pain is not clear. She needs MRI to be further eval.  Discussed with [facility], called bed control.  My great appreciation to the [facility] MDs

## 2012-08-18 NOTE — Progress Notes (Signed)
Patient direct admit to [facility] Room 12. DX: parotitis. Right facial/cheek swelling noted. Patient tearful about admission. Voices concern about child care. Dr. Sherrine Maples paged and notified of arrival and patient's concerns. Patient request to not put on hospital gown until doctor arrives. Oriented to room. Call bell in reach. Family at bedside.

## 2012-08-18 NOTE — Addendum Note (Signed)
Addended by: HATCHER, JEFFREY C on: 08/18/2012 10:14 PM   Modules accepted: Orders

## 2012-08-18 NOTE — Progress Notes (Signed)
Internal Medicine Teaching Service Night Float  Went to see Alison Chambers for admission with resident.  We were called by RN prior to our arrival that Alison Chambers wanted to go home tonight because of social factors.  When we arrived, she explained that she has two young children at home unsupervised and she has to take one of her children to DSS tomorrow for an appointment that she cannot miss and showed [place] the letter.  She did not know the specifics of the appointment, but claims it was set after she was unable to take her daughter to school for the past few days because of her pain.  We have explained to her in detail the need for admission and proper care and treatment, which she understands, but says she must go home and if she can arrange for her children to be cared for she will return or go to the emergency room.  Alison Chambers has decided to leave tonight and has signed paper work to leave AGAINST MEDICAL ADVICE and thanked [place] for our concern and care.    Darden Palmer 08/18/12 10:01pm

## 2012-08-18 NOTE — Assessment & Plan Note (Addendum)
She appears to have recurrence of R mastoid sinusitis. She may have odontogenic source but I was unable to see a defect in her teeth. Also, could be parotitis (pain localizes more to her cheek). She needs CT scan, IV anbx.    Pt sent to hospital. She refused admission. Will send  rx to her pharmacy. She can call ID clinic in AM to be seen again if she needs. Appreciate IM housestaff efforts.

## 2012-08-19 ENCOUNTER — Other Ambulatory Visit: Payer: Self-pay | Admitting: *Deleted

## 2012-08-19 DIAGNOSIS — J019 Acute sinusitis, unspecified: Secondary | ICD-10-CM

## 2012-08-19 MED ORDER — LEVOFLOXACIN 750 MG PO TABS: 750.0000 mg | ORAL_TABLET | Freq: Every day | ORAL | Status: DC

## 2012-08-19 NOTE — Progress Notes (Signed)
Educated patient before leaving facility. Patient stated that she was unable to stay in the hospital resulted to her children. MD came to patient's bedside educated the patient and informed her care was much needed. Pt refused to stay. Pt signed document stating that she was leaving the hospital without the approval of health care professions. Walked off the unit with spouse and her children. She was administered pain med as order by MD, before leaving the unit.

## 2012-08-25 LAB — CULTURE, BLOOD (ROUTINE X 2): Culture: NO GROWTH

## 2012-08-26 ENCOUNTER — Emergency Department (HOSPITAL_COMMUNITY): Payer: Self-pay

## 2012-08-26 ENCOUNTER — Emergency Department (HOSPITAL_COMMUNITY)
Admission: EM | Admit: 2012-08-26 | Discharge: 2012-08-26 | Disposition: A | Discharge status: Home / Self Care | Payer: Self-pay | Attending: Emergency Medicine | Admitting: Emergency Medicine

## 2012-08-26 ENCOUNTER — Encounter (HOSPITAL_COMMUNITY): Payer: Self-pay | Admitting: *Deleted

## 2012-08-26 DIAGNOSIS — B20 Human immunodeficiency virus [HIV] disease: Secondary | ICD-10-CM | POA: Insufficient documentation

## 2012-08-26 DIAGNOSIS — Z91199 Patient's noncompliance with other medical treatment and regimen due to unspecified reason: Secondary | ICD-10-CM | POA: Insufficient documentation

## 2012-08-26 DIAGNOSIS — H60399 Other infective otitis externa, unspecified ear: Secondary | ICD-10-CM | POA: Insufficient documentation

## 2012-08-26 DIAGNOSIS — Z87828 Personal history of other (healed) physical injury and trauma: Secondary | ICD-10-CM | POA: Insufficient documentation

## 2012-08-26 DIAGNOSIS — Z9119 Patient's noncompliance with other medical treatment and regimen: Secondary | ICD-10-CM | POA: Insufficient documentation

## 2012-08-26 DIAGNOSIS — H9209 Otalgia, unspecified ear: Secondary | ICD-10-CM | POA: Insufficient documentation

## 2012-08-26 DIAGNOSIS — H6090 Unspecified otitis externa, unspecified ear: Secondary | ICD-10-CM

## 2012-08-26 DIAGNOSIS — Z9114 Patient's other noncompliance with medication regimen: Secondary | ICD-10-CM

## 2012-08-26 LAB — COMPREHENSIVE METABOLIC PANEL
Albumin: 2.8 g/dL — ABNORMAL LOW (ref 3.5–5.2)
Alkaline Phosphatase: 64 U/L (ref 39–117)
BUN: 9 mg/dL (ref 6–23)
Chloride: 101 mEq/L (ref 96–112)
Creatinine, Ser: 0.54 mg/dL (ref 0.50–1.10)
GFR calc Af Amer: >90 mL/min (ref >90)
Glucose, Bld: 89 mg/dL (ref 70–99)
Potassium: 3.8 mEq/L (ref 3.5–5.1)
Total Bilirubin: 0.3 mg/dL (ref 0.3–1.2)

## 2012-08-26 LAB — CBC WITH DIFFERENTIAL/PLATELET
Basophils Relative: 0 % (ref 0–1)
Eosinophils Absolute: 0 10*3/uL (ref 0.0–0.7)
HCT: 33.6 % — ABNORMAL LOW (ref 36.0–46.0)
Hemoglobin: 11.5 g/dL — ABNORMAL LOW (ref 12.0–15.0)
Lymphs Abs: 0.6 10*3/uL — ABNORMAL LOW (ref 0.7–4.0)
MCH: 27.3 pg (ref 26.0–34.0)
MCHC: 34.2 g/dL (ref 30.0–36.0)
Monocytes Absolute: 0.3 10*3/uL (ref 0.1–1.0)
Monocytes Relative: 11 % (ref 3–12)
Neutro Abs: 2.2 10*3/uL (ref 1.7–7.7)
RBC: 4.22 MIL/uL (ref 3.87–5.11)

## 2012-08-26 MED ORDER — IBUPROFEN 800 MG PO TABS
800.0000 mg | ORAL_TABLET | Freq: Once | ORAL | Status: AC
  Administered 2012-08-26: 800 mg via ORAL
  Filled 2012-08-26: qty 1

## 2012-08-26 MED ORDER — CIPROFLOXACIN HCL 500 MG PO TABS: 500.0000 mg | ORAL_TABLET | Freq: Two times a day (BID) | ORAL | Status: DC

## 2012-08-26 MED ORDER — CIPROFLOXACIN HCL 500 MG PO TABS
500.0000 mg | ORAL_TABLET | Freq: Once | ORAL | Status: AC
  Administered 2012-08-26: 500 mg via ORAL
  Filled 2012-08-26: qty 1

## 2012-08-26 MED ORDER — OXYMETAZOLINE HCL 0.05 % NA SOLN
1.0000 | Freq: Once | NASAL | Status: AC
  Administered 2012-08-26: 1 via NASAL
  Filled 2012-08-26: qty 15

## 2012-08-26 MED ORDER — CIPROFLOXACIN HCL 0.2 % OT SOLN: 0.2000 mL | Freq: Two times a day (BID) | OTIC | Status: DC
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
## 2012-08-26 NOTE — ED Notes (Signed)
Bed:WA11<BR> Expected date:<BR> Expected time:<BR> Means of arrival:<BR> Comments:<BR> Hold for triage

## 2012-08-26 NOTE — ED Notes (Addendum)
Pt reports right sided ear pain x3 days. Pt had tube placed in right ear 7 months ago, and now pt is having pain 10/10 and swelling to right side of neck, pain with swallowing. Productive cough, yellow phlem. Pt reports her chest hurts when she coughs. No chest pain when not coughing.   Pt not taking medications at present for HIV, has not taken medicine for 3 months now. Waiting for medicaid.

## 2012-08-27 ENCOUNTER — Encounter: Payer: Self-pay | Admitting: *Deleted

## 2012-08-27 ENCOUNTER — Telehealth: Payer: Self-pay | Admitting: *Deleted

## 2012-08-27 NOTE — Telephone Encounter (Signed)
Called patient to see how she is doing and she was unable to talk at the time. She advised she will call back when she can. I do not think she will call back so I will call her later and send her a letter.

## 2012-09-10 ENCOUNTER — Encounter (HOSPITAL_COMMUNITY): Payer: Self-pay | Admitting: *Deleted

## 2012-09-10 ENCOUNTER — Emergency Department (HOSPITAL_COMMUNITY)
Admission: EM | Admit: 2012-09-10 | Discharge: 2012-09-10 | Disposition: A | Discharge status: Home / Self Care | Payer: Self-pay | Attending: Emergency Medicine | Admitting: Emergency Medicine

## 2012-09-10 DIAGNOSIS — J029 Acute pharyngitis, unspecified: Secondary | ICD-10-CM | POA: Insufficient documentation

## 2012-09-10 DIAGNOSIS — R059 Cough, unspecified: Secondary | ICD-10-CM | POA: Insufficient documentation

## 2012-09-10 DIAGNOSIS — H9210 Otorrhea, unspecified ear: Secondary | ICD-10-CM | POA: Insufficient documentation

## 2012-09-10 DIAGNOSIS — Z79899 Other long term (current) drug therapy: Secondary | ICD-10-CM | POA: Insufficient documentation

## 2012-09-10 DIAGNOSIS — H9209 Otalgia, unspecified ear: Secondary | ICD-10-CM | POA: Insufficient documentation

## 2012-09-10 DIAGNOSIS — R05 Cough: Secondary | ICD-10-CM | POA: Insufficient documentation

## 2012-09-10 DIAGNOSIS — J069 Acute upper respiratory infection, unspecified: Secondary | ICD-10-CM | POA: Insufficient documentation

## 2012-09-10 DIAGNOSIS — J3489 Other specified disorders of nose and nasal sinuses: Secondary | ICD-10-CM | POA: Insufficient documentation

## 2012-09-10 DIAGNOSIS — H9202 Otalgia, left ear: Secondary | ICD-10-CM

## 2012-09-10 DIAGNOSIS — B20 Human immunodeficiency virus [HIV] disease: Secondary | ICD-10-CM | POA: Insufficient documentation

## 2012-09-10 LAB — BASIC METABOLIC PANEL
CO2: 23 mEq/L (ref 19–32)
Calcium: 9 mg/dL (ref 8.4–10.5)
Chloride: 99 mEq/L (ref 96–112)
Glucose, Bld: 90 mg/dL (ref 70–99)
Potassium: 3.7 mEq/L (ref 3.5–5.1)
Sodium: 131 mEq/L — ABNORMAL LOW (ref 135–145)

## 2012-09-10 LAB — CBC WITH DIFFERENTIAL/PLATELET
Basophils Absolute: 0 10*3/uL (ref 0.0–0.1)
Lymphocytes Relative: 21 % (ref 12–46)
Lymphs Abs: 0.7 10*3/uL (ref 0.7–4.0)
MCV: 79.7 fL (ref 78.0–100.0)
Neutro Abs: 2.4 10*3/uL (ref 1.7–7.7)
Neutrophils Relative %: 72 % (ref 43–77)
Platelets: 230 10*3/uL (ref 150–400)
RBC: 4.49 MIL/uL (ref 3.87–5.11)
RDW: 14 % (ref 11.5–15.5)
WBC: 3.3 10*3/uL — ABNORMAL LOW (ref 4.0–10.5)

## 2012-09-10 LAB — LACTIC ACID, PLASMA: Lactic Acid, Venous: 0.9 mmol/L (ref 0.5–2.2)

## 2012-09-10 MED ORDER — MORPHINE SULFATE 4 MG/ML IJ SOLN: 4.0000 mg | Freq: Once | INTRAMUSCULAR | Status: DC

## 2012-09-10 MED ORDER — SODIUM CHLORIDE 0.9 % IV BOLUS (SEPSIS)
1000.0000 mL | Freq: Once | INTRAVENOUS | Status: AC
  Administered 2012-09-10: 1000 mL via INTRAVENOUS

## 2012-09-10 MED ORDER — CETIRIZINE-PSEUDOEPHEDRINE ER 5-120 MG PO TB12: 1.0000 | ORAL_TABLET | Freq: Every day | ORAL | Status: DC

## 2012-09-10 MED ORDER — MORPHINE SULFATE 4 MG/ML IJ SOLN
4.0000 mg | Freq: Once | INTRAMUSCULAR | Status: AC
  Administered 2012-09-10: 4 mg via INTRAVENOUS
  Filled 2012-09-10: qty 1

## 2012-09-10 NOTE — ED Notes (Signed)
Pt c/o nasal congestion, requesting medication to relieve symptoms

## 2012-09-10 NOTE — ED Notes (Signed)
Fluids offered to pt 

## 2012-09-10 NOTE — ED Notes (Signed)
PA at bedside.

## 2012-09-10 NOTE — ED Notes (Signed)
Pt states she has a sore throat and severe throat pain for 3 days, states she has tubes in her ears
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
## 2012-09-12 LAB — EAR CULTURE: Culture: NO GROWTH

## 2012-09-15 NOTE — ED Provider Notes (Signed)
Medical screening examination/treatment/procedure(s) were conducted as a shared visit with non-physician practitioner(s) and myself.  I personally evaluated the patient during the encounter Pt c/o bil ear soreness/fullness. Notes hx same. Also nasal congestion/rhinorrhea. No fever/chills. No headache. Clear fluid left eac. No mastoid  Tenderness. Discussed w ent to facilitate their follow up with pt.   Suzi Roots, MD 09/15/12 [phone]

## 2012-09-18 ENCOUNTER — Other Ambulatory Visit: Payer: Self-pay | Admitting: *Deleted

## 2012-09-18 DIAGNOSIS — B20 Human immunodeficiency virus [HIV] disease: Secondary | ICD-10-CM

## 2012-09-18 MED ORDER — RITONAVIR 100 MG PO TABS: 100.0000 mg | ORAL_TABLET | Freq: Every day | ORAL | Status: DC

## 2012-09-18 MED ORDER — EMTRICITABINE-TENOFOVIR DF 200-300 MG PO TABS: 1.0000 | ORAL_TABLET | Freq: Every day | ORAL | Status: DC

## 2012-09-18 MED ORDER — DARUNAVIR ETHANOLATE 400 MG PO TABS: 400.0000 mg | ORAL_TABLET | Freq: Two times a day (BID) | ORAL | Status: DC

## 2012-09-18 NOTE — Telephone Encounter (Signed)
Printed to go with ADAP application.

## 2012-09-23 ENCOUNTER — Other Ambulatory Visit: Payer: Self-pay | Admitting: Licensed Clinical Social Worker

## 2012-09-23 DIAGNOSIS — B20 Human immunodeficiency virus [HIV] disease: Secondary | ICD-10-CM

## 2012-09-23 MED ORDER — EMTRICITABINE-TENOFOVIR DF 200-300 MG PO TABS: 1.0000 | ORAL_TABLET | Freq: Every day | ORAL | Status: DC

## 2012-09-23 MED ORDER — DARUNAVIR ETHANOLATE 400 MG PO TABS: 400.0000 mg | ORAL_TABLET | Freq: Two times a day (BID) | ORAL | Status: DC

## 2012-09-23 MED ORDER — RITONAVIR 100 MG PO TABS: 100.0000 mg | ORAL_TABLET | Freq: Every day | ORAL | Status: DC

## 2012-09-26 ENCOUNTER — Encounter (HOSPITAL_COMMUNITY): Payer: Self-pay | Admitting: Emergency Medicine

## 2012-09-26 ENCOUNTER — Emergency Department (INDEPENDENT_AMBULATORY_CARE_PROVIDER_SITE_OTHER)
Admission: EM | Admit: 2012-09-26 | Discharge: 2012-09-26 | Disposition: A | Discharge status: Home / Self Care | Payer: Self-pay | Source: Home / Self Care

## 2012-09-26 DIAGNOSIS — H6692 Otitis media, unspecified, left ear: Secondary | ICD-10-CM

## 2012-09-26 DIAGNOSIS — H6690 Otitis media, unspecified, unspecified ear: Secondary | ICD-10-CM

## 2012-09-26 DIAGNOSIS — J029 Acute pharyngitis, unspecified: Secondary | ICD-10-CM

## 2012-09-26 LAB — POCT RAPID STREP A: Streptococcus, Group A Screen (Direct): NEGATIVE

## 2012-09-26 MED ORDER — CEFUROXIME AXETIL 250 MG PO TABS: 250.0000 mg | ORAL_TABLET | Freq: Two times a day (BID) | ORAL | Status: DC

## 2012-09-26 NOTE — ED Notes (Signed)
Pt c/o sore throat x1 week... Also c/o decreased hearing... Sx include: dysphagia, talking, dry cough, lymph node swelling.... Denies: fevers, vomiting, nauseas, diarrhea.... He is alert w/no signs of acute distress.

## 2012-09-26 NOTE — ED Provider Notes (Signed)
Medical screening examination/treatment/procedure(s) were performed by resident physician or non-physician practitioner and as supervising physician I was immediately available for consultation/collaboration.   Barkley Bruns MD.    Linna Hoff, MD 09/26/12 1950
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
## 2012-09-30 ENCOUNTER — Other Ambulatory Visit: Payer: Self-pay | Admitting: Infectious Diseases

## 2012-09-30 ENCOUNTER — Ambulatory Visit (INDEPENDENT_AMBULATORY_CARE_PROVIDER_SITE_OTHER): Payer: Self-pay | Admitting: Infectious Diseases

## 2012-09-30 ENCOUNTER — Encounter: Payer: Self-pay | Admitting: Infectious Diseases

## 2012-09-30 VITALS — BP 122/81 | HR 93 | Temp 98.4°F | Ht 66.0 in | Wt 227.0 lb

## 2012-09-30 DIAGNOSIS — B20 Human immunodeficiency virus [HIV] disease: Secondary | ICD-10-CM

## 2012-09-30 DIAGNOSIS — Z945 Skin transplant status: Secondary | ICD-10-CM

## 2012-09-30 DIAGNOSIS — J019 Acute sinusitis, unspecified: Secondary | ICD-10-CM

## 2012-09-30 LAB — CBC
MCH: 27.4 pg (ref 26.0–34.0)
MCHC: 34 g/dL (ref 30.0–36.0)
Platelets: 291 10*3/uL (ref 150–400)

## 2012-09-30 MED ORDER — AZITHROMYCIN 250 MG PO TABS: ORAL_TABLET | ORAL | Status: DC

## 2012-09-30 NOTE — Assessment & Plan Note (Signed)
She still has some pain and swelling (worse with walking). Is well healed.

## 2012-09-30 NOTE — Assessment & Plan Note (Signed)
Will recheck her labs today. She states she has been adherent to her ART. Will see her back in 2 months.

## 2012-09-30 NOTE — Assessment & Plan Note (Signed)
Will give her course of azithro. Would like to have her seen by ENT, apparently they cannot see her as they have used their allotment of charity appts for the year. Will ask nursing staff to call regardless.
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
## 2012-10-01 ENCOUNTER — Ambulatory Visit (INDEPENDENT_AMBULATORY_CARE_PROVIDER_SITE_OTHER): Payer: Self-pay | Admitting: Internal Medicine

## 2012-10-01 ENCOUNTER — Encounter: Payer: Self-pay | Admitting: Internal Medicine

## 2012-10-01 VITALS — BP 127/83 | HR 76 | Temp 97.8°F | Ht 66.0 in | Wt 226.9 lb

## 2012-10-01 DIAGNOSIS — J019 Acute sinusitis, unspecified: Secondary | ICD-10-CM

## 2012-10-01 LAB — COMPREHENSIVE METABOLIC PANEL
ALT: 20 U/L (ref 0–35)
AST: 23 U/L (ref 0–37)
Alkaline Phosphatase: 58 U/L (ref 39–117)
Sodium: 132 mEq/L — ABNORMAL LOW (ref 135–145)
Total Bilirubin: 0.3 mg/dL (ref 0.3–1.2)
Total Protein: 9.9 g/dL — ABNORMAL HIGH (ref 6.0–8.3)
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
## 2012-10-01 NOTE — Patient Instructions (Signed)
General Instructions:  Please follow-up at the clinic in 3 weeks, at which time we will reevaluate your ear pain and discharge - OR, please follow-up in the clinic sooner if needed.  There have been changes in your medications:  START taking the Azithromycin (Zpak) that has been sent to your pharmacy - THEY SHOULD DELIVER IT TO YOU TOMORROW   YOU HAVE AN APPOINTMENT WITH DR. Dillard Cannon ON Friday, October 03, 2012 AT 3:30PM - PLEASE DO NOT BE LATE TO THIS APPOINTMENT.    If you have been started on new medication(s), and you develop symptoms concerning for allergic reaction, including, but not limited to, throat closing, tongue swelling, rash, please stop the medication immediately and call the clinic at [phone], and go to the ER.  If symptoms worsen, or new symptoms arise, please call the clinic or go to the ER.  PLEASE BRING ALL OF YOUR MEDICATIONS  IN A BAG TO YOUR NEXT APPOINTMENT   Care Management & Community Referrals: ENT

## 2012-10-01 NOTE — Assessment & Plan Note (Signed)
Assessment: Patient has not started her oral antibiotics. Never filled the Ceftin from the ED. As well, has not filled the ZPak from Dr. Ninetta Lights yesterday. I think she would certainly benefit from antibiotic therapy. I again called in her ZPak to her pharmacy, who should be delivering it to her by tomorrow. She has a maxillofacial CT pending for Friday. I was able to call and arrange an appt with Dr. Ezzard Standing for Friday PM.  Plan:      Zpak  Complete Maxillofacial CT as ordered.  ENT follow-up on Friday.

## 2012-10-01 NOTE — Addendum Note (Signed)
Addended by: Jennet Maduro D on: 10/01/2012 09:37 AM   Modules accepted: Orders

## 2012-10-03 ENCOUNTER — Ambulatory Visit (HOSPITAL_COMMUNITY): Admission: RE | Admit: 2012-10-03 | Payer: Self-pay | Source: Ambulatory Visit

## 2012-10-03 ENCOUNTER — Ambulatory Visit (HOSPITAL_COMMUNITY)
Admission: RE | Admit: 2012-10-03 | Discharge: 2012-10-03 | Disposition: A | Discharge status: Home / Self Care | Payer: Self-pay | Source: Ambulatory Visit | Attending: Infectious Diseases | Admitting: Infectious Diseases

## 2012-10-03 DIAGNOSIS — J019 Acute sinusitis, unspecified: Secondary | ICD-10-CM | POA: Insufficient documentation

## 2012-10-03 DIAGNOSIS — H9209 Otalgia, unspecified ear: Secondary | ICD-10-CM | POA: Insufficient documentation

## 2012-10-03 DIAGNOSIS — J3489 Other specified disorders of nose and nasal sinuses: Secondary | ICD-10-CM | POA: Insufficient documentation

## 2012-10-07 LAB — HIV-1 GENOTYPR PLUS

## 2012-10-24 ENCOUNTER — Encounter: Payer: Self-pay | Admitting: Infectious Diseases

## 2012-11-17 ENCOUNTER — Telehealth: Payer: Self-pay | Admitting: *Deleted

## 2012-11-17 ENCOUNTER — Ambulatory Visit: Payer: Self-pay | Admitting: Infectious Diseases

## 2012-11-17 NOTE — Telephone Encounter (Signed)
Called and left patient a voice mail to call the clinic to reschedule her appt, she no showed today. Alison Chambers  

## 2012-11-26 ENCOUNTER — Other Ambulatory Visit: Payer: Self-pay | Admitting: Infectious Diseases

## 2012-12-01 ENCOUNTER — Telehealth: Payer: Self-pay | Admitting: *Deleted

## 2012-12-01 NOTE — Telephone Encounter (Signed)
Called patient and left a message to call the office and schedule an appt asap.

## 2013-01-08 ENCOUNTER — Other Ambulatory Visit: Payer: Self-pay | Admitting: *Deleted

## 2013-01-08 DIAGNOSIS — Z113 Encounter for screening for infections with a predominantly sexual mode of transmission: Secondary | ICD-10-CM

## 2013-01-08 DIAGNOSIS — Z79899 Other long term (current) drug therapy: Secondary | ICD-10-CM

## 2013-01-08 DIAGNOSIS — B20 Human immunodeficiency virus [HIV] disease: Secondary | ICD-10-CM

## 2013-01-12 ENCOUNTER — Other Ambulatory Visit: Payer: Self-pay

## 2013-01-13 ENCOUNTER — Other Ambulatory Visit: Payer: Self-pay | Admitting: Internal Medicine

## 2013-01-13 ENCOUNTER — Other Ambulatory Visit (INDEPENDENT_AMBULATORY_CARE_PROVIDER_SITE_OTHER): Payer: Self-pay

## 2013-01-13 DIAGNOSIS — B20 Human immunodeficiency virus [HIV] disease: Secondary | ICD-10-CM

## 2013-01-13 DIAGNOSIS — Z113 Encounter for screening for infections with a predominantly sexual mode of transmission: Secondary | ICD-10-CM

## 2013-01-13 DIAGNOSIS — Z79899 Other long term (current) drug therapy: Secondary | ICD-10-CM

## 2013-01-13 LAB — LIPID PANEL
Cholesterol: 154 mg/dL (ref 0–200)
HDL: 23 mg/dL — ABNORMAL LOW (ref >39)
Total CHOL/HDL Ratio: 6.7 Ratio
Triglycerides: 99 mg/dL (ref <150)
VLDL: 20 mg/dL (ref 0–40)

## 2013-01-13 LAB — CBC WITH DIFFERENTIAL/PLATELET
Basophils Absolute: 0 10*3/uL (ref 0.0–0.1)
Basophils Relative: 1 % (ref 0–1)
HCT: 32.4 % — ABNORMAL LOW (ref 36.0–46.0)
MCHC: 34 g/dL (ref 30.0–36.0)
Monocytes Absolute: 0.2 10*3/uL (ref 0.1–1.0)
Neutro Abs: 1.3 10*3/uL — ABNORMAL LOW (ref 1.7–7.7)
Neutrophils Relative %: 58 % (ref 43–77)
RDW: 15 % (ref 11.5–15.5)

## 2013-01-14 LAB — HIV-1 RNA QUANT-NO REFLEX-BLD
HIV 1 RNA Quant: 24130 copies/mL — ABNORMAL HIGH (ref <20)
HIV-1 RNA Quant, Log: 4.38 {Log} — ABNORMAL HIGH (ref <1.30)

## 2013-01-14 LAB — COMPLETE METABOLIC PANEL WITH GFR
AST: 21 U/L (ref 0–37)
Albumin: 3.2 g/dL — ABNORMAL LOW (ref 3.5–5.2)
Alkaline Phosphatase: 56 U/L (ref 39–117)
BUN: 8 mg/dL (ref 6–23)
Creat: 0.77 mg/dL (ref 0.50–1.10)
Potassium: 3.9 mEq/L (ref 3.5–5.3)

## 2013-01-26 ENCOUNTER — Ambulatory Visit: Payer: Self-pay | Admitting: Infectious Diseases

## 2013-01-28 ENCOUNTER — Ambulatory Visit (INDEPENDENT_AMBULATORY_CARE_PROVIDER_SITE_OTHER): Payer: Self-pay | Admitting: Infectious Diseases

## 2013-01-28 ENCOUNTER — Encounter: Payer: Self-pay | Admitting: Infectious Diseases

## 2013-01-28 VITALS — BP 128/87 | HR 66 | Temp 98.4°F | Ht 66.0 in | Wt 215.0 lb

## 2013-01-28 DIAGNOSIS — J019 Acute sinusitis, unspecified: Secondary | ICD-10-CM

## 2013-01-28 DIAGNOSIS — F329 Major depressive disorder, single episode, unspecified: Secondary | ICD-10-CM

## 2013-01-28 DIAGNOSIS — B20 Human immunodeficiency virus [HIV] disease: Secondary | ICD-10-CM

## 2013-01-28 MED ORDER — DAPSONE 100 MG PO TABS: 100.0000 mg | ORAL_TABLET | Freq: Every day | ORAL | Status: DC

## 2013-01-28 NOTE — Progress Notes (Signed)
  Subjective:    Patient ID: Alison Chambers, female    DOB: 1985-10-19, 27 y.o.   MRN: [id number]  HPI 27 yo F with hx of HIV+, depression. Was previously on ISN/TRV but was taking irregularly (both BID). She was then changed in July to DRVr/TRV. She also has a hx of K103N (july 2012). In the fall of 2013 was having difficulty with sinus infections, confirmed on CT.  She has been off her ART until 2 weeks ago (? Suicidal gestrue). DRVr/TRV. Her husband is currently incarcerated.  Now knows that she needs to take her meds to be strong for her children and her husband. She is fearful that her husband will be deported back to [place]. Has gotten help from [facility] [place]) and [place].   Ears feel better. Very tearful today,  Having difficulty with allegations regarding children she was watching (children had been abandoned).    HIV 1 RNA Quant (copies/mL)  Date Value  01/13/2013 24130*  09/30/2012 58141*  04/20/2012 72440*     CD4 T Cell Abs (cmm)  Date Value  01/13/2013 100*  09/30/2012 150*  04/20/2012 100*   Last PAP- 2011.   Review of Systems     Objective:   Physical Exam  Constitutional: She appears well-developed and well-nourished. She appears distressed.  HENT:  Mouth/Throat: No oropharyngeal exudate.  Eyes: EOM are normal. Pupils are equal, round, and reactive to light.  Neck: Neck supple.  Cardiovascular: Normal rate, regular rhythm and normal heart sounds.   Pulmonary/Chest: Effort normal and breath sounds normal.  Abdominal: Soft. Bowel sounds are normal. There is no tenderness.  Lymphadenopathy:    She has no cervical adenopathy.  Psychiatric: She exhibits a depressed mood.  Cries through most of exam          Assessment & Plan:

## 2013-01-28 NOTE — Assessment & Plan Note (Addendum)
Encouraged her take her medicines, eat well, get good rest. I have contacted a friend in public defenders office to try and get her further aid. Will rx medicine for PCP prophylaxis. Will see her back in 1 month. Will have her set up for PAP

## 2013-01-28 NOTE — Assessment & Plan Note (Signed)
Appears to have resolved, for now

## 2013-01-28 NOTE — Assessment & Plan Note (Signed)
Could rx anti-depression but explained to her that this is more situational. She contracts that she will call me or [place] if she quits taking her medicine or has thoughts of huritng herself.

## 2013-02-02 ENCOUNTER — Ambulatory Visit: Payer: Self-pay

## 2013-02-02 ENCOUNTER — Telehealth: Payer: Self-pay | Admitting: *Deleted

## 2013-02-02 ENCOUNTER — Other Ambulatory Visit: Payer: Self-pay | Admitting: Infectious Diseases

## 2013-02-02 ENCOUNTER — Encounter: Payer: Self-pay | Admitting: *Deleted

## 2013-02-02 NOTE — Telephone Encounter (Signed)
Left message.  Requested CM contact pt to arrange new PAP smear appt.  Pt is Spanish-speaking.

## 2013-02-02 NOTE — Telephone Encounter (Signed)
Patient notified via interpreter to stop dapsone per Dr. Ninetta Lights.  Patient also rescheduled her PAP for 02/09/13 at 11:00.  Patient verbalized understanding.  Interpreter [phone] used. Andree Coss, RN

## 2013-02-02 NOTE — Progress Notes (Signed)
Patient notified via interpreter line. Pt verbalized understanding. Andree Coss, RN

## 2013-02-09 ENCOUNTER — Ambulatory Visit: Payer: Self-pay

## 2013-02-24 ENCOUNTER — Emergency Department (HOSPITAL_COMMUNITY): Payer: Self-pay

## 2013-02-24 ENCOUNTER — Encounter (HOSPITAL_COMMUNITY): Payer: Self-pay | Admitting: *Deleted

## 2013-02-24 ENCOUNTER — Inpatient Hospital Stay (HOSPITAL_COMMUNITY)
Admission: EM | Admit: 2013-02-24 | Discharge: 2013-02-26 | DRG: 977 | Disposition: A | Discharge status: Home / Self Care | Payer: MEDICAID | Attending: Internal Medicine | Admitting: Internal Medicine

## 2013-02-24 DIAGNOSIS — Z9119 Patient's noncompliance with other medical treatment and regimen: Secondary | ICD-10-CM

## 2013-02-24 DIAGNOSIS — J019 Acute sinusitis, unspecified: Secondary | ICD-10-CM

## 2013-02-24 DIAGNOSIS — R51 Headache: Secondary | ICD-10-CM

## 2013-02-24 DIAGNOSIS — R591 Generalized enlarged lymph nodes: Secondary | ICD-10-CM

## 2013-02-24 DIAGNOSIS — H7092 Unspecified mastoiditis, left ear: Secondary | ICD-10-CM

## 2013-02-24 DIAGNOSIS — F3289 Other specified depressive episodes: Secondary | ICD-10-CM

## 2013-02-24 DIAGNOSIS — B369 Superficial mycosis, unspecified: Secondary | ICD-10-CM

## 2013-02-24 DIAGNOSIS — Z945 Skin transplant status: Secondary | ICD-10-CM

## 2013-02-24 DIAGNOSIS — B368 Other specified superficial mycoses: Principal | ICD-10-CM | POA: Diagnosis present

## 2013-02-24 DIAGNOSIS — H6692 Otitis media, unspecified, left ear: Secondary | ICD-10-CM

## 2013-02-24 DIAGNOSIS — D649 Anemia, unspecified: Secondary | ICD-10-CM

## 2013-02-24 DIAGNOSIS — E46 Unspecified protein-calorie malnutrition: Secondary | ICD-10-CM

## 2013-02-24 DIAGNOSIS — F329 Major depressive disorder, single episode, unspecified: Secondary | ICD-10-CM

## 2013-02-24 DIAGNOSIS — B20 Human immunodeficiency virus [HIV] disease: Secondary | ICD-10-CM

## 2013-02-24 DIAGNOSIS — H6690 Otitis media, unspecified, unspecified ear: Secondary | ICD-10-CM

## 2013-02-24 DIAGNOSIS — H7490 Unspecified disorder of middle ear and mastoid, unspecified ear: Secondary | ICD-10-CM

## 2013-02-24 DIAGNOSIS — Z9189 Other specified personal risk factors, not elsewhere classified: Secondary | ICD-10-CM

## 2013-02-24 DIAGNOSIS — H663X9 Other chronic suppurative otitis media, unspecified ear: Secondary | ICD-10-CM | POA: Diagnosis present

## 2013-02-24 DIAGNOSIS — H6240 Otitis externa in other diseases classified elsewhere, unspecified ear: Secondary | ICD-10-CM | POA: Diagnosis present

## 2013-02-24 DIAGNOSIS — H7090 Unspecified mastoiditis, unspecified ear: Secondary | ICD-10-CM | POA: Diagnosis present

## 2013-02-24 DIAGNOSIS — Z91199 Patient's noncompliance with other medical treatment and regimen due to unspecified reason: Secondary | ICD-10-CM

## 2013-02-24 DIAGNOSIS — H7093 Unspecified mastoiditis, bilateral: Secondary | ICD-10-CM

## 2013-02-24 HISTORY — DX: Other specified postprocedural states: Z98.890

## 2013-02-24 HISTORY — DX: Nausea with vomiting, unspecified: R11.2

## 2013-02-24 HISTORY — DX: Essential (primary) hypertension: I10

## 2013-02-24 LAB — CBC WITH DIFFERENTIAL/PLATELET
Basophils Absolute: 0 10*3/uL (ref 0.0–0.1)
Basophils Relative: 0 % (ref 0–1)
Eosinophils Relative: 0 % (ref 0–5)
HCT: 35.6 % — ABNORMAL LOW (ref 36.0–46.0)
MCHC: 32.9 g/dL (ref 30.0–36.0)
MCV: 82 fL (ref 78.0–100.0)
Monocytes Absolute: 0.3 10*3/uL (ref 0.1–1.0)
Neutro Abs: 3.4 10*3/uL (ref 1.7–7.7)
RDW: 15.4 % (ref 11.5–15.5)

## 2013-02-24 LAB — POCT I-STAT, CHEM 8
Calcium, Ion: 1.01 mmol/L — ABNORMAL LOW (ref 1.12–1.23)
Creatinine, Ser: 0.7 mg/dL (ref 0.50–1.10)
Glucose, Bld: 82 mg/dL (ref 70–99)
Hemoglobin: 13.9 g/dL (ref 12.0–15.0)
Potassium: 4.4 mEq/L (ref 3.5–5.1)

## 2013-02-24 LAB — BASIC METABOLIC PANEL
Calcium: 8.4 mg/dL (ref 8.4–10.5)
Creatinine, Ser: 0.59 mg/dL (ref 0.50–1.10)
GFR calc Af Amer: >90 mL/min (ref >90)
GFR calc non Af Amer: >90 mL/min (ref >90)

## 2013-02-24 MED ORDER — ONDANSETRON HCL 4 MG/2ML IJ SOLN
4.0000 mg | Freq: Four times a day (QID) | INTRAMUSCULAR | Status: DC | PRN
  Administered 2013-02-24: 4 mg via INTRAVENOUS

## 2013-02-24 MED ORDER — HYDROMORPHONE HCL PF 1 MG/ML IJ SOLN
1.0000 mg | INTRAMUSCULAR | Status: AC | PRN
  Filled 2013-02-24: qty 1

## 2013-02-24 MED ORDER — CIPROFLOXACIN-DEXAMETHASONE 0.3-0.1 % OT SUSP
4.0000 [drp] | Freq: Two times a day (BID) | OTIC | Status: DC
  Administered 2013-02-24 – 2013-02-25 (×2): 4 [drp] via OTIC
  Filled 2013-02-24: qty 7.5

## 2013-02-24 MED ORDER — DARUNAVIR ETHANOLATE 400 MG PO TABS
400.0000 mg | ORAL_TABLET | Freq: Two times a day (BID) | ORAL | Status: DC
  Filled 2013-02-24 (×2): qty 1

## 2013-02-24 MED ORDER — RITONAVIR 100 MG PO TABS
100.0000 mg | ORAL_TABLET | Freq: Every day | ORAL | Status: DC
  Administered 2013-02-24 – 2013-02-26 (×2): 100 mg via ORAL
  Filled 2013-02-24 (×3): qty 1

## 2013-02-24 MED ORDER — ONDANSETRON HCL 4 MG PO TABS: 4.0000 mg | ORAL_TABLET | Freq: Four times a day (QID) | ORAL | Status: DC | PRN

## 2013-02-24 MED ORDER — HYDROMORPHONE HCL PF 1 MG/ML IJ SOLN
1.0000 mg | Freq: Once | INTRAMUSCULAR | Status: AC
  Administered 2013-02-24: 1 mg via INTRAVENOUS
  Filled 2013-02-24: qty 1

## 2013-02-24 MED ORDER — SODIUM CHLORIDE 0.9 % IV SOLN
INTRAVENOUS | Status: AC
  Administered 2013-02-24: 17:00:00 via INTRAVENOUS

## 2013-02-24 MED ORDER — DIPHENHYDRAMINE HCL 50 MG/ML IJ SOLN
12.5000 mg | Freq: Four times a day (QID) | INTRAMUSCULAR | Status: DC | PRN
  Administered 2013-02-24: 12.5 mg via INTRAVENOUS
  Filled 2013-02-24 (×3): qty 1

## 2013-02-24 MED ORDER — DIPHENHYDRAMINE HCL 50 MG/ML IJ SOLN
25.0000 mg | INTRAMUSCULAR | Status: DC | PRN
  Administered 2013-02-24 – 2013-02-26 (×9): 25 mg via INTRAVENOUS
  Filled 2013-02-24 (×8): qty 1

## 2013-02-24 MED ORDER — DAPSONE 100 MG PO TABS
100.0000 mg | ORAL_TABLET | Freq: Every day | ORAL | Status: DC
  Administered 2013-02-24 – 2013-02-26 (×3): 100 mg via ORAL
  Filled 2013-02-24 (×3): qty 1

## 2013-02-24 MED ORDER — DARUNAVIR ETHANOLATE 800 MG PO TABS
800.0000 mg | ORAL_TABLET | Freq: Every day | ORAL | Status: DC
  Administered 2013-02-24 – 2013-02-26 (×2): 800 mg via ORAL
  Filled 2013-02-24 (×3): qty 1

## 2013-02-24 MED ORDER — ONDANSETRON HCL 4 MG/2ML IJ SOLN
4.0000 mg | Freq: Once | INTRAMUSCULAR | Status: AC
  Administered 2013-02-24: 4 mg via INTRAVENOUS
  Filled 2013-02-24: qty 2

## 2013-02-24 MED ORDER — HYDROMORPHONE HCL PF 1 MG/ML IJ SOLN
1.0000 mg | INTRAMUSCULAR | Status: AC
  Administered 2013-02-24 (×3): 1 mg via INTRAVENOUS
  Filled 2013-02-24 (×2): qty 1

## 2013-02-24 MED ORDER — VANCOMYCIN HCL IN DEXTROSE 1-5 GM/200ML-% IV SOLN
1000.0000 mg | INTRAVENOUS | Status: AC
  Administered 2013-02-24: 1000 mg via INTRAVENOUS
  Filled 2013-02-24: qty 200

## 2013-02-24 MED ORDER — MORPHINE SULFATE 4 MG/ML IJ SOLN
4.0000 mg | Freq: Once | INTRAMUSCULAR | Status: AC
  Administered 2013-02-24: 4 mg via INTRAVENOUS
  Filled 2013-02-24: qty 1

## 2013-02-24 MED ORDER — ANTIPYRINE-BENZOCAINE 5.4-1.4 % OT SOLN
3.0000 [drp] | OTIC | Status: DC | PRN
  Administered 2013-02-24: 4 [drp] via OTIC
  Administered 2013-02-25 – 2013-02-26 (×2): 3 [drp] via OTIC
  Filled 2013-02-24: qty 10

## 2013-02-24 MED ORDER — HEPARIN SODIUM (PORCINE) 5000 UNIT/ML IJ SOLN
5000.0000 [IU] | Freq: Three times a day (TID) | INTRAMUSCULAR | Status: DC
  Administered 2013-02-24 – 2013-02-26 (×5): 5000 [IU] via SUBCUTANEOUS
  Filled 2013-02-24 (×8): qty 1

## 2013-02-24 MED ORDER — IOHEXOL 300 MG/ML  SOLN
75.0000 mL | Freq: Once | INTRAMUSCULAR | Status: AC | PRN
  Administered 2013-02-24: 75 mL via INTRAVENOUS

## 2013-02-24 MED ORDER — HYDROMORPHONE HCL PF 1 MG/ML IJ SOLN
1.0000 mg | INTRAMUSCULAR | Status: DC | PRN
  Administered 2013-02-25 (×4): 1 mg via INTRAVENOUS
  Filled 2013-02-24 (×4): qty 1

## 2013-02-24 MED ORDER — EMTRICITABINE-TENOFOVIR DF 200-300 MG PO TABS
1.0000 | ORAL_TABLET | Freq: Every day | ORAL | Status: DC
  Administered 2013-02-24 – 2013-02-26 (×3): 1 via ORAL
  Filled 2013-02-24 (×3): qty 1

## 2013-02-24 MED ORDER — DEXTROSE 5 % IV SOLN
1.0000 g | INTRAVENOUS | Status: DC
  Administered 2013-02-25: 1 g via INTRAVENOUS
  Filled 2013-02-24: qty 10

## 2013-02-24 MED ORDER — VANCOMYCIN HCL IN DEXTROSE 1-5 GM/200ML-% IV SOLN
1000.0000 mg | Freq: Three times a day (TID) | INTRAVENOUS | Status: DC
  Administered 2013-02-25 (×2): 1000 mg via INTRAVENOUS
  Filled 2013-02-24 (×3): qty 200

## 2013-02-24 MED ORDER — ACYCLOVIR 200 MG PO CAPS
400.0000 mg | ORAL_CAPSULE | Freq: Four times a day (QID) | ORAL | Status: DC
  Administered 2013-02-24 – 2013-02-26 (×6): 400 mg via ORAL
  Filled 2013-02-24 (×9): qty 2

## 2013-02-24 MED ORDER — SODIUM CHLORIDE 0.9 % IV BOLUS (SEPSIS)
1000.0000 mL | Freq: Once | INTRAVENOUS | Status: AC
  Administered 2013-02-24: 1000 mL via INTRAVENOUS

## 2013-02-24 MED ORDER — ONDANSETRON HCL 4 MG/2ML IJ SOLN
4.0000 mg | Freq: Three times a day (TID) | INTRAMUSCULAR | Status: AC | PRN
  Filled 2013-02-24: qty 2

## 2013-02-24 MED ORDER — DEXTROSE 5 % IV SOLN
1.0000 g | Freq: Once | INTRAVENOUS | Status: AC
  Administered 2013-02-24: 1 g via INTRAVENOUS
  Filled 2013-02-24: qty 10

## 2013-02-24 MED ORDER — ONDANSETRON 4 MG PO TBDP
4.0000 mg | ORAL_TABLET | Freq: Once | ORAL | Status: AC
  Administered 2013-02-24: 4 mg via ORAL
  Filled 2013-02-24: qty 1

## 2013-02-24 NOTE — Progress Notes (Signed)
Pt speaks broken [place]. She speaks [place]. Culture swab done , Lt ear.

## 2013-02-24 NOTE — ED Notes (Signed)
Pt reports L ear pain x 4 days ago, noticed L side facial swelling the next day.  Pt reports severe pain as well.  Pt also reports fever.

## 2013-02-24 NOTE — Progress Notes (Signed)
[facility] CL gave patient Primary Care Resourses.
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
## 2013-02-24 NOTE — ED Provider Notes (Signed)
Complains of left ear pain subjective fever for the past 4 days accompanied by vomiting 3 episodes today. No hearing deficit On exam patient is alert nontoxic appears uncomfortable she is tender and swollen immediately inferior to the left ear. Left ear is painful upon retraction of the pinna and tragus  Doug Sou, MD 02/24/13 1600

## 2013-02-24 NOTE — Progress Notes (Signed)
UR completed 
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
## 2013-02-25 DIAGNOSIS — F329 Major depressive disorder, single episode, unspecified: Secondary | ICD-10-CM

## 2013-02-25 DIAGNOSIS — R51 Headache: Secondary | ICD-10-CM

## 2013-02-25 LAB — T-HELPER CELLS (CD4) COUNT (NOT AT ARMC): CD4 % Helper T Cell: 15 % — ABNORMAL LOW (ref 33–55)

## 2013-02-25 LAB — HIV-1 RNA QUANT-NO REFLEX-BLD
HIV 1 RNA Quant: 12080 copies/mL — ABNORMAL HIGH (ref <20)
HIV-1 RNA Quant, Log: 4.08 {Log} — ABNORMAL HIGH (ref <1.30)

## 2013-02-25 MED ORDER — AMOXICILLIN-POT CLAVULANATE 875-125 MG PO TABS
1.0000 | ORAL_TABLET | Freq: Two times a day (BID) | ORAL | Status: DC
  Administered 2013-02-25 – 2013-02-26 (×3): 1 via ORAL
  Filled 2013-02-25 (×4): qty 1

## 2013-02-25 MED ORDER — HYDROMORPHONE HCL PF 2 MG/ML IJ SOLN
2.0000 mg | INTRAMUSCULAR | Status: DC | PRN
  Administered 2013-02-25 (×3): 2 mg via INTRAVENOUS
  Filled 2013-02-25 (×4): qty 1

## 2013-02-25 MED ORDER — ENSURE COMPLETE PO LIQD: 237.0000 mL | Freq: Two times a day (BID) | ORAL | Status: DC

## 2013-02-25 MED ORDER — HYDROXYZINE HCL 50 MG PO TABS
50.0000 mg | ORAL_TABLET | Freq: Once | ORAL | Status: AC
  Administered 2013-02-25: 50 mg via ORAL
  Filled 2013-02-25: qty 1
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
## 2013-02-25 NOTE — Progress Notes (Signed)
[facility] PROGRESS NOTE  Alison Chambers WUJ:[id number] DOB: 09-29-1986 DOA: 02/24/2013 PCP: [name], MD  Brief narrative: 27 -year-old female with past medical history of HIV, AIDS on HAART (last CD4 01/13/2013 100), recurrent bilateral otitis media and myringotomy tubes who presented to [facility] ED 02/24/2013 with purulent drainage from left ear and fevers. CT scan of temporal bones was significant for otitis media and mastoiditis. Patient was started on Rocephin, vancomycin and cipro ear drops. I spoke with infectious disease MD (Dr. [name]) and recommendation was to start Augmentin and discontinue the above-mentioned antibiotics as they would only treat otitis externa but not otitis media. In addition, I checked with the patient and she reported no anaphylaxis with PCN related antibiotics and even the rash she does not really recall having a significant reaction with antibiotics.    Assessment/Plan:  Principal problem: Otitis media, mastoiditis left ear -  Discontinue Rocephin, vancomycin and Cipro ear drops - Start Augmentin by mouth twice daily - Appreciate ENT consult  Active problems: HIV on HAART - Continue current medications - Dapsone for PCP to prophylaxis; Zovirax for HSV prophylaxis  Code Status: full code Family Communication: No family at the bedside Disposition Plan: Home when stable  [name], MD  [facility] Pager [phone]  If 7PM-7AM, please contact night-coverage [email or web address] Password [email or web address] 02/25/2013, 1:38 PM   LOS: 1 day   Consultants:  ENT  Procedures:  None   Antibiotics:  Rocephin 02/24/2013 -->02/25/2013  Vanco 02/24/2013 -->02/25/2013  Ciprodex era drops 02/24/2013 -->02/25/2013  Augmentin 02/25/2013 -->  HAART and for prophylaxis related meds (dapsone, prezista, truvada, norvir and zovirax)  HPI/Subjective: No acute overnight events.  Objective: Filed Vitals:   02/24/13 1753 02/24/13 2100 02/25/13 0500 02/25/13 1332  BP: 105/86  128/64 104/65 119/62  Pulse: 94 92 83 70  Temp: 97.9 F (36.6 C)  98.3 F (36.8 C) 98.6 F (37 C)  TempSrc: Oral Oral Oral   Resp: 20 18 18 18   Height:      Weight: 96.4 kg (212 lb 8.4 oz)     SpO2: 100% 100% 97% 99%    Intake/Output Summary (Last 24 hours) at 02/25/13 1338 Last data filed at 02/25/13 1300  Gross per 24 hour  Intake   3299 ml  Output   1800 ml  Net   1499 ml    Exam:   General:  Pt is alert, follows commands appropriately, not in acute distress  Cardiovascular: Regular rate and rhythm, S1/S2, no murmurs, no rubs, no gallops  Respiratory: Clear to auscultation bilaterally, no wheezing, no crackles, no rhonchi  Abdomen: Soft, non tender, non distended, bowel sounds present, no guarding  Extremities: No edema, pulses DP and PT palpable bilaterally  Neuro: Grossly nonfocal  Data Reviewed: Basic Metabolic Panel:  Recent Labs Lab 02/24/13 1524 02/24/13 1640  NA 137 132*  K 4.4 3.6  CL 107 101  CO2  --  25  GLUCOSE 82 103*  BUN 13 9  CREATININE 0.70 0.59  CALCIUM  --  8.4   CBC:  Recent Labs Lab 02/24/13 1524 02/24/13 1640  WBC  --  4.4  NEUTROABS  --  3.4  HGB 13.9 11.7*  HCT 41.0 35.6*  MCV  --  82.0  PLT  --  196    CULTURE, BLOOD (ROUTINE X 2)     Status: None   Collection Time    02/24/13  4:30 PM      Result Value Range Status   Value:  BLOOD CULTURE RECEIVED NO GROWTH TO DATE    Report Status PENDING   Incomplete  CULTURE, BLOOD (ROUTINE X 2)     Status: None   Collection Time    28-Feb-2013  4:40 PM      Result Value Range Status   Culture     Final   Value:        BLOOD CULTURE RECEIVED NO GROWTH TO DATE    Report Status PENDING   Incomplete  WOUND CULTURE     Status: None   Collection Time    28-Feb-2013  6:33 PM      Result Value Range Status   Specimen Description DRAINAGE LT EAR   Final     RARE YEAST   Culture PENDING   Incomplete   Report Status PENDING   Incomplete     Studies: Ct Temporal Bones  W/cm 02/28/13  IMPRESSION: Findings consistent with left middle ear otitis and mastoiditis. No visible coalescence to suggest cholesteatoma.  Moderate fluid is present also on the right mastoid, but not the right middle ear which could represent an effusion versus right mastoiditis.  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 This appears increased from December 2013. HIV patients are at increased risk of lymphoma however, and this appearance should be correlated with physical examination, and tissue sampling as indicated.  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      Scheduled Meds: . acyclovir  400 mg Oral QID  . cefTRIAXone   1 g Intravenous Q24H  . ciprofloxacin-dexametha  4 drop Both Ears BID  . dapsone  100 mg Oral Daily  . darunavir  800 mg Oral Q breakfast  . emtricitabine-tenofovir  1 tablet Oral Daily  . ritonavir  100 mg Oral Q breakfast  . vancomycin  1,000 mg Intravenous Q8H

## 2013-02-26 DIAGNOSIS — H6240 Otitis externa in other diseases classified elsewhere, unspecified ear: Secondary | ICD-10-CM

## 2013-02-26 DIAGNOSIS — D649 Anemia, unspecified: Secondary | ICD-10-CM

## 2013-02-26 DIAGNOSIS — B369 Superficial mycosis, unspecified: Secondary | ICD-10-CM | POA: Diagnosis present

## 2013-02-26 DIAGNOSIS — B368 Other specified superficial mycoses: Principal | ICD-10-CM

## 2013-02-26 LAB — CBC
HCT: 31.2 % — ABNORMAL LOW (ref 36.0–46.0)
MCH: 27.3 pg (ref 26.0–34.0)
MCV: 82.8 fL (ref 78.0–100.0)
Platelets: 170 10*3/uL (ref 150–400)
RBC: 3.77 MIL/uL — ABNORMAL LOW (ref 3.87–5.11)
RDW: 15.4 % (ref 11.5–15.5)
WBC: 2 10*3/uL — ABNORMAL LOW (ref 4.0–10.5)

## 2013-02-26 MED ORDER — TRAMADOL HCL 50 MG PO TABS
50.0000 mg | ORAL_TABLET | Freq: Four times a day (QID) | ORAL | Status: DC | PRN
  Administered 2013-02-26: 50 mg via ORAL
  Filled 2013-02-26: qty 1

## 2013-02-26 MED ORDER — TRAMADOL HCL 50 MG PO TABS: 50.0000 mg | ORAL_TABLET | Freq: Four times a day (QID) | ORAL | Status: DC | PRN

## 2013-02-26 MED ORDER — KETOROLAC TROMETHAMINE 30 MG/ML IJ SOLN
30.0000 mg | Freq: Once | INTRAMUSCULAR | Status: AC
  Administered 2013-02-26: 30 mg via INTRAVENOUS
  Filled 2013-02-26: qty 1

## 2013-02-26 MED ORDER — DAPSONE 100 MG PO TABS: 100.0000 mg | ORAL_TABLET | Freq: Every day | ORAL | Status: DC

## 2013-02-26 MED ORDER — NEOMYCIN-POLYMYXIN-HC 3.5-10000-1 OT SOLN: 3.0000 [drp] | Freq: Four times a day (QID) | OTIC | Status: DC

## 2013-02-26 MED ORDER — ACYCLOVIR 200 MG PO CAPS: 400.0000 mg | ORAL_CAPSULE | Freq: Four times a day (QID) | ORAL | Status: DC

## 2013-02-26 MED ORDER — AMOXICILLIN-POT CLAVULANATE 875-125 MG PO TABS: 1.0000 | ORAL_TABLET | Freq: Two times a day (BID) | ORAL | Status: DC

## 2013-02-26 NOTE — ED Provider Notes (Signed)
Medical screening examination/treatment/procedure(s) were conducted as a shared visit with non-physician practitioner(s) and myself.  I personally evaluated the patient during the encounter  Doug Sou, MD 02/26/13 2006

## 2013-02-26 NOTE — Progress Notes (Signed)
Offered support. Patient requested prayer for her family and children as they struggle with understanding her disease. Presence; listening; prayer.

## 2013-02-26 NOTE — Discharge Summary (Signed)
Physician Discharge Summary  Alison Chambers LKG:[id number] DOB: Jan 01, 1986 DOA: 02/24/2013  PCP: [name], MD  Admit date: 02/24/2013 Discharge date: 02/26/2013  Recommendations for Outpatient Follow-up:  1. As outlined below, patient was instructed to go immediately to ENT office where she will be seen for microscopic cleaning of left ear. 2. Prescriptions provided for Augmentin, Cortisporin ear drops and tramadol for pain relief.  Discharge Diagnoses:  Active Problems:   Otomycosis   HIV INFECTION   HX, PERSONAL, PAST NONCOMPLIANCE   Otitis media of left ear   Mastoiditis of left side  Discharge Condition: Medically stable for discharge home today.  Diet recommendation: As tolerated  History of present illness:  27 -year-old female with past medical history of HIV, AIDS on HAART (last CD4 01/13/2013 100), recurrent bilateral otitis media and myringotomy tubes who presented to [facility] ED 02/24/2013 with purulent drainage from left ear and fevers. CT scan of temporal bones was significant for otitis media and mastoiditis. Patient was started on Rocephin, vancomycin and cipro ear drops. I spoke with infectious disease MD (Dr. [name]) and recommendation was to start Augmentin and discontinue the above-mentioned antibiotics as they would only treat otitis externa but not otitis media. In addition, I checked with the patient and she reported no anaphylaxis with PCN related antibiotics and even the rash she does not really recall having a significant reaction with antibiotics.   Assessment/Plan:   Principal problem:  Otitis media, otomycosis and mastoiditis left ear  - Discontinued Rocephin, vancomycin and Cipro ear drops  - Started Augmentin by mouth twice daily which patient will continue for next 2 weeks on discharge. In addition prescription provided for Cortisporin ear drops. Patient will go to ENT office today for microscopic cleaning of left ear.  Active problems:  HIV on HAART  -  Continue current medications  - Dapsone for PCP to prophylaxis; Zovirax for HSV prophylaxis   Code Status: full code  Family Communication: No family at the bedside    [name], MD  [facility]  Pager [phone]    Consultants:  ENT Procedures:  None  Antibiotics:  Rocephin 02/24/2013 -->02/25/2013  Vanco 02/24/2013 -->02/25/2013  Ciprodex era drops 02/24/2013 -->02/25/2013  Augmentin 02/25/2013 --> for 2 weeks on discharge HAART and for prophylaxis related meds (dapsone, prezista, truvada, norvir and zovirax)    Discharge Exam: Filed Vitals:   02/26/13 0551  BP: 117/72  Pulse: 63  Temp: 97.3 F (36.3 C)  Resp: 18   Filed Vitals:   02/25/13 0500 02/25/13 1332 02/25/13 2023 02/26/13 0551  BP: 104/65 119/62 120/74 117/72  Pulse: 83 70 76 63  Temp: 98.3 F (36.8 C) 98.6 F (37 C) 98.2 F (36.8 C) 97.3 F (36.3 C)  TempSrc: Oral  Oral Oral  Resp: 18 18 18 18   Height:      Weight:      SpO2: 97% 99% 99% 100%    General: Pt is alert, follows commands appropriately, not in acute distress Cardiovascular: Regular rate and rhythm, S1/S2 +, no murmurs, no rubs, no gallops Respiratory: Clear to auscultation bilaterally, no wheezing, no crackles, no rhonchi Abdominal: Soft, non tender, non distended, bowel sounds +, no guarding Extremities: no edema, no cyanosis, pulses palpable bilaterally DP and PT Neuro: Grossly nonfocal  Discharge Instructions  Discharge Orders   Future Appointments Provider Department Dept Phone   03/18/2013 10:00 AM Alison Smart, MD [facility] [phone]   Future Orders Complete By Expires  Call MD for:  difficulty breathing, headache or visual disturbances  As directed     Call MD for:  persistant dizziness or light-headedness  As directed     Call MD for:  persistant nausea and vomiting  As directed     Call MD for:  severe uncontrolled pain  As directed     Diet - low sodium heart healthy  As directed      Discharge instructions  As directed     Comments:      Please continue taking Augmentin for another 2 weeks on discharge. Please go to ENT office ASAP after the discharge as they have to perform microscopic cleaning of your left ear. Use ear drops per prescription dose and frequency.    Increase activity slowly  As directed         Medication List    TAKE these medications       acetaminophen 500 MG tablet  Commonly known as:  TYLENOL  Take 1,000 mg by mouth every 6 (six) hours as needed for pain.     acyclovir 200 MG capsule  Commonly known as:  ZOVIRAX  Take 2 capsules (400 mg total) by mouth 4 (four) times daily.     amoxicillin-clavulanate 875-125 MG per tablet  Commonly known as:  AUGMENTIN  Take 1 tablet by mouth every 12 (twelve) hours.     dapsone 100 MG tablet  Take 1 tablet (100 mg total) by mouth daily.     darunavir 400 MG tablet  Commonly known as:  PREZISTA  Take 1 tablet (400 mg total) by mouth 2 (two) times daily with a meal.     emtricitabine-tenofovir 200-300 MG per tablet  Commonly known as:  TRUVADA  Take 1 tablet by mouth daily.     neomycin-polymyxin-hydrocortisone otic solution  Commonly known as:  CORTISPORIN  Place 3 drops into the left ear 4 (four) times daily.     ritonavir 100 MG Tabs  Commonly known as:  NORVIR  Take 1 tablet (100 mg total) by mouth daily.     traMADol 50 MG tablet  Commonly known as:  ULTRAM  Take 1 tablet (50 mg total) by mouth every 6 (six) hours as needed.           Follow-up Information   Follow up with [name], MD. (ASAP follow up today 02/26/2013)    Contact information:   [name] [place] [address] [phone]        The results of significant diagnostics from this hospitalization (including imaging, microbiology, ancillary and laboratory) are listed below for reference.    Significant Diagnostic Studies: Ct Temporal Bones  W/cm  02/24/2013   *RADIOLOGY REPORT*  Clinical Data: Left ear pain for 4 days.  Fever and congestion. HIV.  CT TEMPORAL BONES WITH CONTRAST  Technique:  Axial and coronal plane CT imaging of the petrous temporal bones was performed with thin-collimation image reconstruction after intravenous contrast administration. Multiplanar CT image reconstructions were also generated.  Contrast: 75mL OMNIPAQUE IOHEXOL 300 MG/ML  SOLN  Comparison: MRI head 04/20/2012.  Limited sinus 10/03/2012.  Findings: The nasopharynx is almost completely obliterated with adenoidal tissue; this is likely HIV related.  The torus tubarius is obscured by  nasopharyngeal adenoidal hypertrophy, and eustachian tube orifices are likely obstructed.  Similar finding was noted in December 2013 but appears increased today.  Small cysts are noted in both parotid glands, likely lymphoepithelial  related HIV lesions.  The entire parotid is not visualized.  The right external canal is unremarkable.  The right tympanic membrane is slightly retracted.  There is minimal fluid in Prussak's space,  but no significant fluid to suggest right middle ear otitis. Ossicles intact.  There is moderate amount of fluid in the right mastoid, particularly in its dependent portion but no definite coalescence.  Tegmen tympani and mastoideum are intact. Normal inner ear structures on the right.  On the left, the external canal demonstrates slight edema.  There is considerable fluid in the middle ear and the left tympanic membrane bulges outward.  Fluid surrounds the ossicles, with the attic  essentially clear.  There is fluid in the mastoid antrum and throughout much of the mastoid air cells.  There is no definite coalescence.  The tegmen tympani and mastoideum are intact.  The inner ear structures are normal.  Visualized intracranial compartment reveals no obvious intracranial lesions.  Bilateral maxillary sinus retention cysts are present, but there is no sphenoid, or  ethmoid fluid.  IMPRESSION: Findings consistent with left middle ear otitis and mastoiditis. No visible coalescence to suggest cholesteatoma.  Moderate fluid is present also on the right mastoid, but not the right middle ear which could represent an effusion versus right mastoiditis.  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 This appears increased from December 2013. HIV patients are at increased risk of lymphoma however, and this appearance should be correlated with physical examination, and tissue sampling as indicated.  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   Original Report Authenticated By: Davonna Belling, M.D.    Microbiology: Recent Results (from the past 240 hour(s))  CULTURE, BLOOD (ROUTINE X 2)     Status: None   Collection Time    02/24/13  4:30 PM      Result Value Range Status   Specimen Description BLOOD RIGHT ARM   Final   Special Requests BOTTLES DRAWN AEROBIC ONLY 2.5CC   Final   Culture  Setup Time 02/24/2013 22:46   Final   Culture     Final   Value:        BLOOD CULTURE RECEIVED NO GROWTH TO DATE CULTURE WILL BE HELD FOR 5 DAYS BEFORE ISSUING A FINAL NEGATIVE REPORT   Report Status PENDING   Incomplete  CULTURE, BLOOD (ROUTINE X 2)     Status: None   Collection Time    02/24/13  4:40 PM      Result Value Range Status   Specimen Description BLOOD LEFT HAND   Final   Special Requests BOTTLES DRAWN AEROBIC AND ANAEROBIC [facility]   Final   Culture  Setup Time 02/24/2013 22:47   Final   Culture     Final   Value:        BLOOD CULTURE RECEIVED NO GROWTH TO DATE CULTURE WILL BE HELD FOR 5 DAYS BEFORE ISSUING A FINAL NEGATIVE REPORT   Report Status PENDING   Incomplete  WOUND CULTURE     Status: None   Collection Time    02/24/13  6:33 PM      Result Value Range Status   Specimen Description DRAINAGE LT EAR   Final   Special Requests NONE   Final   Gram Stain     Final   Value: NO WBC SEEN     RARE SQUAMOUS EPITHELIAL CELLS  PRESENT     RARE YEAST   Culture PENDING   Incomplete   Report Status  PENDING   Incomplete     Labs: Basic Metabolic Panel:  Recent Labs Lab 02/24/13 1524 02/24/13 1640  NA 137 132*  K 4.4 3.6  CL 107 101  CO2  --  25  GLUCOSE 82 103*  BUN 13 9  CREATININE 0.70 0.59  CALCIUM  --  8.4   Liver Function Tests: No results found for this basename: AST, ALT, ALKPHOS, BILITOT, PROT, ALBUMIN,  in the last 168 hours No results found for this basename: LIPASE, AMYLASE,  in the last 168 hours No results found for this basename: AMMONIA,  in the last 168 hours CBC:  Recent Labs Lab 02/24/13 1524 02/24/13 1640 02/26/13 0740  WBC  --  4.4 2.0*  NEUTROABS  --  3.4  --   HGB 13.9 11.7* 10.3*  HCT 41.0 35.6* 31.2*  MCV  --  82.0 82.8  PLT  --  196 170   Cardiac Enzymes: No results found for this basename: CKTOTAL, CKMB, CKMBINDEX, TROPONINI,  in the last 168 hours BNP: BNP (last 3 results) No results found for this basename: PROBNP,  in the last 8760 hours CBG: No results found for this basename: GLUCAP,  in the last 168 hours  Time coordinating discharge: Over 30 minutes  Signed:  [name], MD  [facility]  02/26/2013, 10:08 AM  Pager #: [phone]

## 2013-02-27 LAB — WOUND CULTURE

## 2013-03-02 LAB — CULTURE, BLOOD (ROUTINE X 2)

## 2013-03-18 ENCOUNTER — Ambulatory Visit (INDEPENDENT_AMBULATORY_CARE_PROVIDER_SITE_OTHER): Payer: Self-pay | Admitting: Infectious Diseases

## 2013-03-18 ENCOUNTER — Encounter: Payer: Self-pay | Admitting: Infectious Diseases

## 2013-03-18 VITALS — BP 127/79 | HR 74 | Temp 98.6°F | Wt 213.0 lb

## 2013-03-18 DIAGNOSIS — B20 Human immunodeficiency virus [HIV] disease: Secondary | ICD-10-CM

## 2013-03-18 DIAGNOSIS — H6690 Otitis media, unspecified, unspecified ear: Secondary | ICD-10-CM

## 2013-03-18 DIAGNOSIS — H6692 Otitis media, unspecified, left ear: Secondary | ICD-10-CM

## 2013-03-18 LAB — COMPREHENSIVE METABOLIC PANEL
Albumin: 3.5 g/dL (ref 3.5–5.2)
CO2: 26 mEq/L (ref 19–32)
Calcium: 8.4 mg/dL (ref 8.4–10.5)
Glucose, Bld: 90 mg/dL (ref 70–99)
Potassium: 4 mEq/L (ref 3.5–5.3)
Sodium: 131 mEq/L — ABNORMAL LOW (ref 135–145)
Total Protein: 9.6 g/dL — ABNORMAL HIGH (ref 6.0–8.3)

## 2013-03-18 LAB — CBC
HCT: 35.7 % — ABNORMAL LOW (ref 36.0–46.0)
Hemoglobin: 12 g/dL (ref 12.0–15.0)
MCH: 27.2 pg (ref 26.0–34.0)
RBC: 4.41 MIL/uL (ref 3.87–5.11)

## 2013-03-18 NOTE — Progress Notes (Signed)
  Subjective:    Patient ID: Alison Chambers, female    DOB: 11/29/1985, 29 y.o.   MRN: [id number]  HPI 27 yo female with a history of HIV positive as well as chronic otitis media and otitis externa of her left ear. She was hospitalized from May 13 to the 15th for the same. She was discharged home on Augmentin and Cortisporin eardrops, and asked to proceed to ENT physician's office immediately upon discharge.She was seen there and was given ear drops which she took for 3-4 days. She has return appt on the 16th.  Having problems with pain in her L leg.   HIV 1 RNA Quant (copies/mL)  Date Value  02/24/2013 12080*  01/13/2013 24130*  09/30/2012 11914*     CD4 T Cell Abs (cmm)  Date Value  02/24/2013 90*  01/13/2013 100*  09/30/2012 150*   She states she has been taking her ART, has a pill box that beeps to remind her.   Review of Systems  Constitutional: Negative for appetite change and unexpected weight change.  Gastrointestinal: Negative for diarrhea and constipation.  Genitourinary: Negative for difficulty urinating.  can't remember last PAP Bruise at top of L leg wound dropped box on 3 weeks ago.      Objective:   Physical Exam  Constitutional: She appears well-developed and well-nourished.  HENT:  Mouth/Throat: No oropharyngeal exudate.  Eyes: EOM are normal. Pupils are equal, round, and reactive to light.  Neck: Neck supple.  Cardiovascular: Normal rate, regular rhythm and normal heart sounds.   Pulmonary/Chest: Effort normal and breath sounds normal.  Abdominal: Soft. Bowel sounds are normal. There is no tenderness. There is no rebound.  Musculoskeletal:       Legs: Lymphadenopathy:    She has no cervical adenopathy.          Assessment & Plan:

## 2013-03-18 NOTE — Assessment & Plan Note (Signed)
Will repeat her labs. Not sure if she is taking her meds. She is offered/refused condoms. Will restart her Hep B series. See her back in 2-3 months

## 2013-03-18 NOTE — Assessment & Plan Note (Signed)
Greatly appreciate ENT f/u.

## 2013-03-19 LAB — HIV-1 RNA ULTRAQUANT REFLEX TO GENTYP+
HIV 1 RNA Quant: 6415 copies/mL — ABNORMAL HIGH (ref <20)
HIV-1 RNA Quant, Log: 3.81 {Log} — ABNORMAL HIGH (ref <1.30)

## 2013-04-06 ENCOUNTER — Telehealth: Payer: Self-pay | Admitting: *Deleted

## 2013-04-06 NOTE — Telephone Encounter (Signed)
Made appt for 05/04/13 @ 1100.  Pt verbalized understanding.

## 2013-05-04 ENCOUNTER — Ambulatory Visit: Payer: Self-pay

## 2013-05-13 ENCOUNTER — Encounter: Payer: Self-pay | Admitting: Infectious Diseases

## 2013-05-13 ENCOUNTER — Ambulatory Visit (INDEPENDENT_AMBULATORY_CARE_PROVIDER_SITE_OTHER): Payer: Self-pay | Admitting: Infectious Diseases

## 2013-05-13 VITALS — BP 117/79 | HR 64 | Temp 98.5°F | Ht 64.0 in | Wt 218.0 lb

## 2013-05-13 DIAGNOSIS — IMO0002 Reserved for concepts with insufficient information to code with codable children: Secondary | ICD-10-CM | POA: Insufficient documentation

## 2013-05-13 DIAGNOSIS — R6889 Other general symptoms and signs: Secondary | ICD-10-CM

## 2013-05-13 DIAGNOSIS — Z124 Encounter for screening for malignant neoplasm of cervix: Secondary | ICD-10-CM

## 2013-05-13 DIAGNOSIS — B20 Human immunodeficiency virus [HIV] disease: Secondary | ICD-10-CM

## 2013-05-13 DIAGNOSIS — F3289 Other specified depressive episodes: Secondary | ICD-10-CM

## 2013-05-13 DIAGNOSIS — F329 Major depressive disorder, single episode, unspecified: Secondary | ICD-10-CM

## 2013-05-13 LAB — CBC
HCT: 35.1 % — ABNORMAL LOW (ref 36.0–46.0)
Hemoglobin: 12 g/dL (ref 12.0–15.0)
MCH: 28.3 pg (ref 26.0–34.0)
MCV: 82.8 fL (ref 78.0–100.0)
RBC: 4.24 MIL/uL (ref 3.87–5.11)

## 2013-05-13 LAB — COMPREHENSIVE METABOLIC PANEL
CO2: 25 mEq/L (ref 19–32)
Creat: 0.74 mg/dL (ref 0.50–1.10)
Glucose, Bld: 95 mg/dL (ref 70–99)
Sodium: 131 mEq/L — ABNORMAL LOW (ref 135–145)
Total Bilirubin: 0.4 mg/dL (ref 0.3–1.2)
Total Protein: 9.7 g/dL — ABNORMAL HIGH (ref 6.0–8.3)

## 2013-05-13 NOTE — Assessment & Plan Note (Signed)
Multiple issues with her daughter. Will try to get her daughter back into counseling. My great appreciation to [facility].

## 2013-05-13 NOTE — Assessment & Plan Note (Signed)
She states she is taking her meds well. She will goto lab today to assure this. She is offered/refused condoms. Will see her back in 1 month.

## 2013-05-13 NOTE — Progress Notes (Signed)
  Subjective:    Patient ID: Alison Chambers, female    DOB: 1985/12/23, 27 y.o.   MRN: [id number]  HPI  27 yo female with a history of HIV positive as well as chronic otitis media and otitis externa of her left ear. Has only forgotten her meds 2 times since last visit. No roblems with ears since last visit.   HIV 1 RNA Quant (copies/mL)  Date Value  03/18/2013 6415*  02/24/2013 12080*  01/13/2013 24130*     CD4 T Cell Abs (cmm)  Date Value  03/18/2013 120*  02/24/2013 90*  01/13/2013 100*   Genotype 03-18-13: RT: K103N,PR: L10I,K20R,M36I Should be taking DRVr/TRV (not sure that she is taking RTV).   Review of Systems  Constitutional: Negative for appetite change and unexpected weight change.  Gastrointestinal: Negative for diarrhea and constipation.  Genitourinary: Negative for dysuria.  Psychiatric/Behavioral: The patient is nervous/anxious.    Her daughter has been having difficulty with hysterical episodes. Daughter does not want to see counselor (12).      Objective:   Physical Exam  Constitutional: She appears well-developed and well-nourished.  Eyes: EOM are normal. Pupils are equal, round, and reactive to light.  Neck: Neck supple.  Cardiovascular: Normal rate, regular rhythm and normal heart sounds.   Pulmonary/Chest: Effort normal and breath sounds normal.  Abdominal: Soft. Bowel sounds are normal. There is no tenderness. There is no rebound.  Lymphadenopathy:    She has no cervical adenopathy.  Psychiatric: Her mood appears anxious. She exhibits a depressed mood.          Assessment & Plan:

## 2013-05-13 NOTE — Addendum Note (Signed)
Addended by: Mariea Clonts D on: 05/13/2013 11:04 AM   Modules accepted: Orders

## 2013-05-15 LAB — HIV-1 RNA QUANT-NO REFLEX-BLD
HIV 1 RNA Quant: 25949 copies/mL — ABNORMAL HIGH (ref <20)
HIV-1 RNA Quant, Log: 4.41 {Log} — ABNORMAL HIGH (ref <1.30)

## 2013-05-18 ENCOUNTER — Other Ambulatory Visit: Payer: Self-pay | Admitting: Infectious Diseases

## 2013-05-18 ENCOUNTER — Encounter: Payer: Self-pay | Admitting: *Deleted

## 2013-05-18 DIAGNOSIS — IMO0002 Reserved for concepts with insufficient information to code with codable children: Secondary | ICD-10-CM

## 2013-05-18 NOTE — Addendum Note (Signed)
Addended by: Jennet Maduro D on: 05/18/2013 12:17 PM   Modules accepted: Orders

## 2013-05-21 ENCOUNTER — Encounter (HOSPITAL_COMMUNITY): Payer: Self-pay | Admitting: *Deleted

## 2013-05-21 ENCOUNTER — Emergency Department (HOSPITAL_COMMUNITY)
Admission: EM | Admit: 2013-05-21 | Discharge: 2013-05-21 | Disposition: A | Discharge status: Home / Self Care | Payer: Self-pay | Attending: Emergency Medicine | Admitting: Emergency Medicine

## 2013-05-21 DIAGNOSIS — H6092 Unspecified otitis externa, left ear: Secondary | ICD-10-CM

## 2013-05-21 DIAGNOSIS — Z21 Asymptomatic human immunodeficiency virus [HIV] infection status: Secondary | ICD-10-CM | POA: Insufficient documentation

## 2013-05-21 DIAGNOSIS — I10 Essential (primary) hypertension: Secondary | ICD-10-CM | POA: Insufficient documentation

## 2013-05-21 DIAGNOSIS — Z87828 Personal history of other (healed) physical injury and trauma: Secondary | ICD-10-CM | POA: Insufficient documentation

## 2013-05-21 DIAGNOSIS — H60399 Other infective otitis externa, unspecified ear: Secondary | ICD-10-CM | POA: Insufficient documentation

## 2013-05-21 DIAGNOSIS — H9209 Otalgia, unspecified ear: Secondary | ICD-10-CM | POA: Insufficient documentation

## 2013-05-21 DIAGNOSIS — R599 Enlarged lymph nodes, unspecified: Secondary | ICD-10-CM | POA: Insufficient documentation

## 2013-05-21 DIAGNOSIS — Z79899 Other long term (current) drug therapy: Secondary | ICD-10-CM | POA: Insufficient documentation

## 2013-05-21 MED ORDER — ANTIPYRINE-BENZOCAINE 5.4-1.4 % OT SOLN
3.0000 [drp] | Freq: Once | OTIC | Status: AC
  Administered 2013-05-21: 3 [drp] via OTIC
  Filled 2013-05-21: qty 10

## 2013-05-21 MED ORDER — NEOMYCIN-COLIST-HC-THONZONIUM 3.3-3-10-0.5 MG/ML OT SUSP
4.0000 [drp] | Freq: Four times a day (QID) | OTIC | Status: DC
  Administered 2013-05-21: 4 [drp] via OTIC
  Filled 2013-05-21: qty 5

## 2013-05-21 NOTE — ED Notes (Signed)
Pt reports swelling to jaw line x 1 week and congestion
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
## 2013-05-22 NOTE — ED Provider Notes (Signed)
Medical screening examination/treatment/procedure(s) were performed by non-physician practitioner and as supervising physician I was immediately available for consultation/collaboration.   Shanna Cisco, MD 05/22/13 [phone]

## 2013-06-10 ENCOUNTER — Ambulatory Visit: Payer: Self-pay | Admitting: Infectious Diseases

## 2013-06-10 ENCOUNTER — Telehealth: Payer: Self-pay | Admitting: *Deleted

## 2013-06-10 NOTE — Telephone Encounter (Signed)
Spoke with patient via [employer] and she was given appt for 07/01/13 for 8:45 AM. Alison Chambers

## 2013-06-17 ENCOUNTER — Other Ambulatory Visit: Payer: Self-pay

## 2013-06-20 ENCOUNTER — Encounter (HOSPITAL_COMMUNITY): Payer: Self-pay | Admitting: Emergency Medicine

## 2013-06-20 ENCOUNTER — Inpatient Hospital Stay (HOSPITAL_COMMUNITY)
Admission: EM | Admit: 2013-06-20 | Discharge: 2013-06-24 | DRG: 154 | Disposition: A | Discharge status: Home / Self Care | Payer: Self-pay | Attending: Internal Medicine | Admitting: Internal Medicine

## 2013-06-20 ENCOUNTER — Emergency Department (HOSPITAL_COMMUNITY)
Admission: EM | Admit: 2013-06-20 | Discharge: 2013-06-20 | Disposition: A | Discharge status: Home / Self Care | Payer: Self-pay | Attending: Emergency Medicine | Admitting: Emergency Medicine

## 2013-06-20 ENCOUNTER — Emergency Department (HOSPITAL_COMMUNITY): Payer: Self-pay

## 2013-06-20 ENCOUNTER — Encounter (HOSPITAL_COMMUNITY): Payer: Self-pay | Admitting: Nurse Practitioner

## 2013-06-20 DIAGNOSIS — D649 Anemia, unspecified: Secondary | ICD-10-CM

## 2013-06-20 DIAGNOSIS — H7092 Unspecified mastoiditis, left ear: Secondary | ICD-10-CM | POA: Diagnosis present

## 2013-06-20 DIAGNOSIS — S0920XA Traumatic rupture of unspecified ear drum, initial encounter: Principal | ICD-10-CM | POA: Diagnosis present

## 2013-06-20 DIAGNOSIS — H7090 Unspecified mastoiditis, unspecified ear: Secondary | ICD-10-CM | POA: Diagnosis present

## 2013-06-20 DIAGNOSIS — R51 Headache: Secondary | ICD-10-CM

## 2013-06-20 DIAGNOSIS — A0472 Enterocolitis due to Clostridium difficile, not specified as recurrent: Secondary | ICD-10-CM | POA: Diagnosis present

## 2013-06-20 DIAGNOSIS — B20 Human immunodeficiency virus [HIV] disease: Secondary | ICD-10-CM | POA: Diagnosis present

## 2013-06-20 DIAGNOSIS — Z9119 Patient's noncompliance with other medical treatment and regimen: Secondary | ICD-10-CM

## 2013-06-20 DIAGNOSIS — H9202 Otalgia, left ear: Secondary | ICD-10-CM

## 2013-06-20 DIAGNOSIS — E46 Unspecified protein-calorie malnutrition: Secondary | ICD-10-CM

## 2013-06-20 DIAGNOSIS — Z21 Asymptomatic human immunodeficiency virus [HIV] infection status: Secondary | ICD-10-CM | POA: Insufficient documentation

## 2013-06-20 DIAGNOSIS — H6690 Otitis media, unspecified, unspecified ear: Secondary | ICD-10-CM | POA: Diagnosis present

## 2013-06-20 DIAGNOSIS — I10 Essential (primary) hypertension: Secondary | ICD-10-CM | POA: Diagnosis present

## 2013-06-20 DIAGNOSIS — B369 Superficial mycosis, unspecified: Secondary | ICD-10-CM

## 2013-06-20 DIAGNOSIS — Z9189 Other specified personal risk factors, not elsewhere classified: Secondary | ICD-10-CM

## 2013-06-20 DIAGNOSIS — Z87828 Personal history of other (healed) physical injury and trauma: Secondary | ICD-10-CM | POA: Insufficient documentation

## 2013-06-20 DIAGNOSIS — F329 Major depressive disorder, single episode, unspecified: Secondary | ICD-10-CM

## 2013-06-20 DIAGNOSIS — H6592 Unspecified nonsuppurative otitis media, left ear: Secondary | ICD-10-CM

## 2013-06-20 DIAGNOSIS — Z945 Skin transplant status: Secondary | ICD-10-CM

## 2013-06-20 DIAGNOSIS — IMO0002 Reserved for concepts with insufficient information to code with codable children: Secondary | ICD-10-CM

## 2013-06-20 DIAGNOSIS — J019 Acute sinusitis, unspecified: Secondary | ICD-10-CM

## 2013-06-20 DIAGNOSIS — H65199 Other acute nonsuppurative otitis media, unspecified ear: Secondary | ICD-10-CM | POA: Insufficient documentation

## 2013-06-20 DIAGNOSIS — H6692 Otitis media, unspecified, left ear: Secondary | ICD-10-CM

## 2013-06-20 LAB — CBC WITH DIFFERENTIAL/PLATELET
Basophils Absolute: 0 10*3/uL (ref 0.0–0.1)
Basophils Relative: 0 % (ref 0–1)
Eosinophils Absolute: 0 10*3/uL (ref 0.0–0.7)
Eosinophils Relative: 0 % (ref 0–5)
Lymphocytes Relative: 10 % — ABNORMAL LOW (ref 12–46)
MCV: 83.4 fL (ref 78.0–100.0)
Platelets: 213 10*3/uL (ref 150–400)
RDW: 14 % (ref 11.5–15.5)
WBC: 8.7 10*3/uL (ref 4.0–10.5)

## 2013-06-20 LAB — COMPREHENSIVE METABOLIC PANEL
ALT: 22 U/L (ref 0–35)
AST: 21 U/L (ref 0–37)
Albumin: 2.8 g/dL — ABNORMAL LOW (ref 3.5–5.2)
CO2: 22 mEq/L (ref 19–32)
Calcium: 8.7 mg/dL (ref 8.4–10.5)
Sodium: 129 mEq/L — ABNORMAL LOW (ref 135–145)
Total Protein: 9.6 g/dL — ABNORMAL HIGH (ref 6.0–8.3)

## 2013-06-20 MED ORDER — OXYCODONE-ACETAMINOPHEN 5-325 MG PO TABS
1.0000 | ORAL_TABLET | Freq: Once | ORAL | Status: AC
  Administered 2013-06-20: 1 via ORAL
  Filled 2013-06-20: qty 1

## 2013-06-20 MED ORDER — MORPHINE SULFATE 2 MG/ML IJ SOLN
2.0000 mg | Freq: Once | INTRAMUSCULAR | Status: AC
  Administered 2013-06-20: 2 mg via INTRAMUSCULAR
  Filled 2013-06-20: qty 1

## 2013-06-20 MED ORDER — TRAMADOL HCL 50 MG PO TABS: 50.0000 mg | ORAL_TABLET | Freq: Four times a day (QID) | ORAL | Status: DC | PRN

## 2013-06-20 MED ORDER — IBUPROFEN 400 MG PO TABS
600.0000 mg | ORAL_TABLET | Freq: Once | ORAL | Status: AC
  Administered 2013-06-20: 600 mg via ORAL
  Filled 2013-06-20 (×2): qty 1

## 2013-06-20 MED ORDER — SODIUM CHLORIDE 0.9 % IV BOLUS (SEPSIS)
1000.0000 mL | Freq: Once | INTRAVENOUS | Status: AC
  Administered 2013-06-20: 1000 mL via INTRAVENOUS

## 2013-06-20 MED ORDER — MORPHINE SULFATE 4 MG/ML IJ SOLN
6.0000 mg | Freq: Once | INTRAMUSCULAR | Status: AC
  Administered 2013-06-20: 6 mg via INTRAMUSCULAR
  Filled 2013-06-20: qty 2

## 2013-06-20 MED ORDER — AMOXICILLIN 500 MG PO CAPS: 1000.0000 mg | ORAL_CAPSULE | Freq: Three times a day (TID) | ORAL | Status: DC

## 2013-06-20 MED ORDER — DIPHENHYDRAMINE HCL 50 MG/ML IJ SOLN
25.0000 mg | Freq: Once | INTRAMUSCULAR | Status: AC
  Administered 2013-06-20: 25 mg via INTRAVENOUS
  Filled 2013-06-20: qty 1

## 2013-06-20 MED ORDER — AMOXICILLIN-POT CLAVULANATE 875-125 MG PO TABS: 1.0000 | ORAL_TABLET | Freq: Two times a day (BID) | ORAL | Status: DC

## 2013-06-20 MED ORDER — KETOROLAC TROMETHAMINE 60 MG/2ML IM SOLN
60.0000 mg | Freq: Once | INTRAMUSCULAR | Status: AC
  Administered 2013-06-20: 60 mg via INTRAMUSCULAR
  Filled 2013-06-20: qty 2

## 2013-06-20 NOTE — ED Notes (Signed)
Pt reports she was treated at [facility] ED last night for L sided ear ache, discharged home with amoxicillin which she started but pain is still severe in her L ear and she had a fever all night. Pt took tylenol 2 hours ago.

## 2013-06-20 NOTE — ED Provider Notes (Signed)
Case discussed with Charlestine Night, PA-C. Transfer of care from [facility], PA-C at change in shift.  Alison Chambers is a 27 year old female with past medical history of HIV and hypertension presenting to emergency department with left ear pain has been ongoing for the past 5 days described aggressively worse. Patient reports she's been using Tylenol over-the-counter without any form of relief. Patient was seen in the ED at [facility] last night was diagnosed with left middle ear effusion, discharge home with amoxicillin. Patient returned to [facility] cone this afternoon with severe pain to her left ear and reporting fever all night.  Patient seen and assessed by Cristal Deer lawyer this afternoon with CT scan ordered. CT temporal bones noted opacifications of the right mastoid air cells that have minimally improved. Complete opacification of the left axillary sinus. Mild mucosal thickening of ethmoid sinus air cells and sphenoid sinus air cells bilaterally. Do to patient being HIV positive presence of lymphoid tissue could possibly lead to lymphoma suspicion.  5:30 PM discussed findings and CT results with Dr. Preston Fleeting. Dr. Preston Fleeting recommended patient to stop amoxicillin into place patient on Augmentin with pain medications to be administered and followup with ENT as palpation. Dr. Preston Fleeting cleared patient for discharge.  6:20 PM spoke with Dr. Preston Fleeting regarding increased pain. Assess patient, swelling noted to the left aspect of the jaw, preauricular region with pain upon palpation. Negative active drainage identified from the left ear. Discussed case with Dr. Preston Fleeting, recommended to get a CBC and CMP, discussed to get more pain medications and if pain cannot be controlled and patient will be admitted to the hospital.  8:18 p.m. after 6 g of morphine and 2 Percocets patient is to have discomfort and pain. Patient tearful and crying secondary to discomfort. Decreased range of motion to the neck secondary to  pain. Pain upon palpation to the left preauricular region with swelling, negative erythema. Active drainage from the left ear, thick yellow discharge. Pain upon palpation and tugging of the left ear. CBC white blood cell count to be 8.7 when compared to previous labs patient's blood cell count is usually 2.0. CBC noted low potassium of 3.3. Patient's fever controlled in ED setting, 99.29F.  9:04 PM spoke with Dr. Julian Reil, triad hospitalist, will come down to see patient and assessed. Consult for ENT ordered.  9:10 PM spoke with Dr. Suszanne Conners, ENT physician, reported that he will come and see and assess patient tomorrow while patient is admitted.  12:01 AM Spoke with Dr. Julian Reil, triad hospitalist, patient to be admitted as observation to MedSurg team 10. Patient admitted to hospital for being immunocompromised, AIDS with CD4 count of 90 last performed in 04/2013, with possible otitis externa ruling out malignant otitis externa. ENT consult performed, ENT will see patient tomorrow.       Raymon Mutton, PA-C 06/21/13 0151
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
## 2013-06-20 NOTE — ED Notes (Signed)
Pt c/o L ear pain x 5 days, denies fever, denies n/v/d. Swelling noted into jaw line

## 2013-06-20 NOTE — ED Notes (Signed)
Dr. Gardner at bedside 

## 2013-06-21 DIAGNOSIS — H7090 Unspecified mastoiditis, unspecified ear: Secondary | ICD-10-CM

## 2013-06-21 DIAGNOSIS — H6690 Otitis media, unspecified, unspecified ear: Secondary | ICD-10-CM

## 2013-06-21 DIAGNOSIS — Z21 Asymptomatic human immunodeficiency virus [HIV] infection status: Secondary | ICD-10-CM

## 2013-06-21 DIAGNOSIS — H9209 Otalgia, unspecified ear: Secondary | ICD-10-CM

## 2013-06-21 LAB — CBC
MCH: 28.9 pg (ref 26.0–34.0)
MCHC: 34.2 g/dL (ref 30.0–36.0)
MCV: 84.6 fL (ref 78.0–100.0)
Platelets: 201 10*3/uL (ref 150–400)

## 2013-06-21 LAB — BASIC METABOLIC PANEL
CO2: 24 mEq/L (ref 19–32)
Calcium: 8.4 mg/dL (ref 8.4–10.5)
Creatinine, Ser: 0.62 mg/dL (ref 0.50–1.10)
GFR calc non Af Amer: >90 mL/min (ref >90)
Glucose, Bld: 117 mg/dL — ABNORMAL HIGH (ref 70–99)
Sodium: 131 mEq/L — ABNORMAL LOW (ref 135–145)

## 2013-06-21 MED ORDER — MORPHINE SULFATE 4 MG/ML IJ SOLN
4.0000 mg | INTRAMUSCULAR | Status: DC | PRN
  Administered 2013-06-21 (×2): 4 mg via INTRAVENOUS
  Filled 2013-06-21 (×2): qty 1

## 2013-06-21 MED ORDER — PIPERACILLIN-TAZOBACTAM 3.375 G IVPB
3.3750 g | Freq: Three times a day (TID) | INTRAVENOUS | Status: DC
  Administered 2013-06-21 – 2013-06-23 (×7): 3.375 g via INTRAVENOUS
  Filled 2013-06-21 (×10): qty 50

## 2013-06-21 MED ORDER — DIPHENHYDRAMINE HCL 50 MG/ML IJ SOLN
25.0000 mg | Freq: Once | INTRAMUSCULAR | Status: AC
  Administered 2013-06-21: 25 mg via INTRAVENOUS
  Filled 2013-06-21: qty 1

## 2013-06-21 MED ORDER — PNEUMOCOCCAL VAC POLYVALENT 25 MCG/0.5ML IJ INJ
0.5000 mL | INJECTION | INTRAMUSCULAR | Status: AC
  Administered 2013-06-22: 0.5 mL via INTRAMUSCULAR
  Filled 2013-06-21: qty 0.5

## 2013-06-21 MED ORDER — DIPHENHYDRAMINE HCL 25 MG PO CAPS
25.0000 mg | ORAL_CAPSULE | Freq: Four times a day (QID) | ORAL | Status: DC | PRN
  Administered 2013-06-21 – 2013-06-24 (×4): 25 mg via ORAL
  Filled 2013-06-21 (×4): qty 1

## 2013-06-21 MED ORDER — POTASSIUM CHLORIDE CRYS ER 20 MEQ PO TBCR
20.0000 meq | EXTENDED_RELEASE_TABLET | Freq: Once | ORAL | Status: AC
  Administered 2013-06-21: 20 meq via ORAL
  Filled 2013-06-21: qty 1

## 2013-06-21 MED ORDER — GI COCKTAIL ~~LOC~~
30.0000 mL | Freq: Three times a day (TID) | ORAL | Status: DC | PRN
  Administered 2013-06-21: 30 mL via ORAL
  Filled 2013-06-21: qty 30

## 2013-06-21 MED ORDER — OXYCODONE-ACETAMINOPHEN 5-325 MG PO TABS
1.0000 | ORAL_TABLET | ORAL | Status: DC | PRN
  Administered 2013-06-21 – 2013-06-22 (×2): 1 via ORAL
  Administered 2013-06-23 (×2): 2 via ORAL
  Filled 2013-06-21: qty 1
  Filled 2013-06-21: qty 2
  Filled 2013-06-21: qty 1
  Filled 2013-06-21: qty 2

## 2013-06-21 MED ORDER — EMTRICITABINE-TENOFOVIR DF 200-300 MG PO TABS
1.0000 | ORAL_TABLET | Freq: Every day | ORAL | Status: DC
  Administered 2013-06-21 – 2013-06-24 (×4): 1 via ORAL
  Filled 2013-06-21 (×4): qty 1

## 2013-06-21 MED ORDER — AMOXICILLIN-POT CLAVULANATE 875-125 MG PO TABS
1.0000 | ORAL_TABLET | Freq: Two times a day (BID) | ORAL | Status: DC
  Administered 2013-06-21: 1 via ORAL
  Filled 2013-06-21 (×3): qty 1

## 2013-06-21 MED ORDER — DARUNAVIR ETHANOLATE 400 MG PO TABS
400.0000 mg | ORAL_TABLET | Freq: Two times a day (BID) | ORAL | Status: DC
  Administered 2013-06-21 – 2013-06-23 (×5): 400 mg via ORAL
  Filled 2013-06-21 (×9): qty 1

## 2013-06-21 MED ORDER — RITONAVIR 100 MG PO TABS
100.0000 mg | ORAL_TABLET | Freq: Every day | ORAL | Status: DC
  Administered 2013-06-21 – 2013-06-24 (×4): 100 mg via ORAL
  Filled 2013-06-21 (×7): qty 1

## 2013-06-21 MED ORDER — HYDROXYZINE HCL 25 MG PO TABS
25.0000 mg | ORAL_TABLET | Freq: Four times a day (QID) | ORAL | Status: DC | PRN
  Administered 2013-06-21 – 2013-06-24 (×5): 25 mg via ORAL
  Filled 2013-06-21 (×5): qty 1

## 2013-06-21 MED ORDER — PANTOPRAZOLE SODIUM 40 MG PO TBEC
40.0000 mg | DELAYED_RELEASE_TABLET | Freq: Every day | ORAL | Status: DC
  Administered 2013-06-21 – 2013-06-24 (×4): 40 mg via ORAL
  Filled 2013-06-21 (×5): qty 1

## 2013-06-21 MED ORDER — OXYCODONE HCL 5 MG PO TABS
5.0000 mg | ORAL_TABLET | ORAL | Status: DC | PRN
  Administered 2013-06-21: 5 mg via ORAL
  Filled 2013-06-21: qty 1

## 2013-06-21 MED ORDER — HYDROMORPHONE HCL PF 1 MG/ML IJ SOLN
1.0000 mg | INTRAMUSCULAR | Status: DC | PRN
  Administered 2013-06-21 – 2013-06-22 (×3): 1 mg via INTRAVENOUS
  Filled 2013-06-21 (×3): qty 1

## 2013-06-21 MED ORDER — ACETAMINOPHEN 325 MG PO TABS
650.0000 mg | ORAL_TABLET | Freq: Four times a day (QID) | ORAL | Status: DC | PRN
  Administered 2013-06-21: 650 mg via ORAL
  Filled 2013-06-21: qty 2

## 2013-06-21 NOTE — Progress Notes (Signed)
Paged MD on call about patient's left ear pain unrelieved by 4 mg Morphine given at 0100.  Pt rates pain 10 out of 10.
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
## 2013-06-21 NOTE — ED Notes (Signed)
Attempted to call report to floor 

## 2013-06-21 NOTE — Progress Notes (Signed)
Pt seen and examined at bedside. Pt still with pain but better controlled. She is admitted with left recurrent otitis media. Appreciate ENT input. Will change ABX to IV, continue analgesia as needed. Pt has HIV as well, will continue HAART.  Debbora Presto, MD  [facility] Pager [phone]  If 7PM-7AM, please contact night-coverage [email or web address] Password [email or web address]
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
## 2013-06-21 NOTE — Progress Notes (Signed)
MEDICATION RELATED CONSULT NOTE   Asked by Dr. Julian Reil to resume prior antiretroviral therapy.  Per patient, she stopped taking all her HIV medications more than 1 month ago.     Her regimen had included:  Prezista 400 mg BID  Truvada 1 tab QDay  Norvir 100 mg QDay  Suggest Infectious Disease consult and viral load/CD4 count to determine appropriate prophylaxis regimen  Alison Chambers 06/21/2013,3:20 AM

## 2013-06-21 NOTE — ED Provider Notes (Signed)
Medical screening examination/treatment/procedure(s) were performed by non-physician practitioner and as supervising physician I was immediately available for consultation/collaboration.   Uzair Godley, MD 06/21/13 1504 

## 2013-06-22 ENCOUNTER — Inpatient Hospital Stay (HOSPITAL_COMMUNITY): Payer: Self-pay

## 2013-06-22 ENCOUNTER — Encounter (HOSPITAL_COMMUNITY): Payer: Self-pay | Admitting: Radiology

## 2013-06-22 DIAGNOSIS — F3289 Other specified depressive episodes: Secondary | ICD-10-CM

## 2013-06-22 DIAGNOSIS — J019 Acute sinusitis, unspecified: Secondary | ICD-10-CM

## 2013-06-22 DIAGNOSIS — F329 Major depressive disorder, single episode, unspecified: Secondary | ICD-10-CM

## 2013-06-22 LAB — CBC
MCH: 28 pg (ref 26.0–34.0)
MCV: 85 fL (ref 78.0–100.0)
Platelets: 210 10*3/uL (ref 150–400)
RBC: 3.79 MIL/uL — ABNORMAL LOW (ref 3.87–5.11)
RDW: 14.4 % (ref 11.5–15.5)
WBC: 4.5 10*3/uL (ref 4.0–10.5)

## 2013-06-22 LAB — BASIC METABOLIC PANEL
CO2: 23 mEq/L (ref 19–32)
Calcium: 8.4 mg/dL (ref 8.4–10.5)
Creatinine, Ser: 0.68 mg/dL (ref 0.50–1.10)
GFR calc Af Amer: >90 mL/min (ref >90)
Sodium: 130 mEq/L — ABNORMAL LOW (ref 135–145)

## 2013-06-22 MED ORDER — IOHEXOL 300 MG/ML  SOLN
25.0000 mL | INTRAMUSCULAR | Status: AC
  Administered 2013-06-22 (×2): 25 mL via ORAL

## 2013-06-22 MED ORDER — IOHEXOL 300 MG/ML  SOLN
100.0000 mL | Freq: Once | INTRAMUSCULAR | Status: AC | PRN
  Administered 2013-06-22: 100 mL via INTRAVENOUS

## 2013-06-22 MED ORDER — HYDROMORPHONE HCL PF 1 MG/ML IJ SOLN
2.0000 mg | INTRAMUSCULAR | Status: DC | PRN
  Administered 2013-06-22: 2 mg via INTRAVENOUS
  Filled 2013-06-22: qty 2

## 2013-06-22 NOTE — Progress Notes (Signed)
Patient ID: Alison Chambers, female   DOB: [birth year], 27 y.o.   MRN: [id number] [facility] PROGRESS NOTE  Alison Chambers [id number]:[id number] DOB: 1986-09-17 DOA: 06/20/2013 PCP: [name], MD  Brief narrative: 27 y.o. female with HIV and frequent and recurrent bouts of otitis media. Her present episode started 5 days prior to this admission. The patient presented to the ED one day prior to this admission with ear pain, found to have otitis media and was  discharged on amoxicillin. She returned to the ED with persistent ear pain associated with fevers and chills. Her Otitis media has now ruptured through her ear drum and is draining purulent fluid out her ear. Her amoxicillin was changed to Augmentin and she was referred for an admission by [facility] and ENT consulted bu ED doctor.   Principal Problem:   Otitis media of left ear - appreciate ENT input, continue to follow upon recommendations - continue Zosyn day #2 Active Problems:   HIV INFECTION - continue antiretroviral regimen    Mastoiditis of left side - management with ABX as noted above    Abdominal pain with diarrhea - unclear etiology at this time - ? C. Diff as pt is on ABX - will place order for C. Diff by PCR - CT abdomen and pelvis ordered  - continue analgesia as needed     Anemia of chronic disease - no signs of active bleed - CBC in AM  Consultants:  ENT  Procedures/Studies: Ct Temporal Bones W/o Cm  06/20/2013 Progressive complete opacification of the left mastoid air cells and middle ear cavity.  Opacification of portions of the right mastoid air cells minimally improved.  Right middle ear cavity is clear.  Almost complete opacification left maxillary sinus.  Moderate mucosal thickening right maxillary sinus.  Mild mucosal thickening ethmoid sinus air cells and sphenoid sinus air cells bilaterally.  As the patient is immunocompromised, atypical infection contributing to the above described findings cannot be excluded.   Once again noted is prominence of soft tissue posterior-superior nasopharynx which may contribute to the mastoid opacification.  The patient is HIV positive and this may represent presence of lymphoid tissue although lymphoma not excluded as previously noted.  Please see above.   Antibiotics:  Zosyn 9/7 -->   Code Status: Full Family Communication: Pt at bedside Disposition Plan: Home when medically stable  HPI/Subjective: No events overnight.   Objective: Filed Vitals:   06/21/13 0500 06/21/13 1424 06/21/13 2232 06/22/13 0541  BP: 111/65 107/50 110/56 101/60  Pulse: 86 96 97 86  Temp: 97.8 F (36.6 C) 98.2 F (36.8 C) 99.1 F (37.3 C) 98.1 F (36.7 C)  TempSrc: Oral Oral Oral Oral  Resp: 16 20 20 19   Height:      Weight:      SpO2: 97% 100% 100% 100%    Intake/Output Summary (Last 24 hours) at 06/22/13 0936 Last data filed at 06/21/13 1424  Gross per 24 hour  Intake    240 ml  Output      0 ml  Net    240 ml    Exam:   General:  Pt is alert, follows commands appropriately, not in acute distress  Cardiovascular: Regular rate and rhythm, S1/S2, no murmurs, no rubs, no gallops  Respiratory: Clear to auscultation bilaterally, no wheezing, no crackles, no rhonchi  Abdomen: Soft, tender in epigastric area, non distended, bowel sounds present, no guarding  Extremities: No edema, pulses DP and PT palpable bilaterally  Neuro: Grossly nonfocal  Data Reviewed: Basic Metabolic Panel:  Recent Labs Lab 06/20/13 1850 06/21/13 0425 06/22/13 0505  NA 129* 131* 130*  K 3.3* 3.5 3.5  CL 98 101 99  CO2 22 24 23   GLUCOSE 109* 117* 100*  BUN 8 7 8   CREATININE 0.57 0.62 0.68  CALCIUM 8.7 8.4 8.4   Liver Function Tests:  Recent Labs Lab 06/20/13 1850  AST 21  ALT 22  ALKPHOS 49  BILITOT 0.9  PROT 9.6*  ALBUMIN 2.8*   CBC:  Recent Labs Lab 06/20/13 1850 06/21/13 0425 06/22/13 0505  WBC 8.7 6.2 4.5  NEUTROABS 7.3  --   --   HGB 11.4* 11.1* 10.6*  HCT  32.6* 32.5* 32.2*  MCV 83.4 84.6 85.0  PLT 213 201 210   Scheduled Meds: . darunavir  400 mg Oral BID WC  . emtricitabine-tenofovir  1 tablet Oral Daily  . pantoprazole  40 mg Oral Daily  . piperacillin-tazobactam (ZOSYN)  IV  3.375 g Intravenous Q8H  . pneumococcal 23 valent vaccine  0.5 mL Intramuscular Tomorrow-1000  . ritonavir  100 mg Oral Q breakfast   Continuous Infusions:    Debbora Presto, MD  [facility] Pager [phone]  If 7PM-7AM, please contact night-coverage [email or web address] Password [facility] 06/22/2013, 9:36 AM   LOS: 2 days

## 2013-06-23 LAB — BASIC METABOLIC PANEL
Chloride: 100 mEq/L (ref 96–112)
GFR calc Af Amer: >90 mL/min (ref >90)
Potassium: 3.4 mEq/L — ABNORMAL LOW (ref 3.5–5.1)

## 2013-06-23 LAB — CBC
HCT: 31.8 % — ABNORMAL LOW (ref 36.0–46.0)
Platelets: 201 10*3/uL (ref 150–400)
RBC: 3.74 MIL/uL — ABNORMAL LOW (ref 3.87–5.11)
RDW: 14.3 % (ref 11.5–15.5)
WBC: 2.4 10*3/uL — ABNORMAL LOW (ref 4.0–10.5)

## 2013-06-23 MED ORDER — POTASSIUM CHLORIDE CRYS ER 20 MEQ PO TBCR
40.0000 meq | EXTENDED_RELEASE_TABLET | Freq: Once | ORAL | Status: AC
  Administered 2013-06-23: 40 meq via ORAL

## 2013-06-23 MED ORDER — CIPROFLOXACIN-DEXAMETHASONE 0.3-0.1 % OT SUSP
2.0000 [drp] | Freq: Two times a day (BID) | OTIC | Status: DC
  Administered 2013-06-23 – 2013-06-24 (×3): 2 [drp] via OTIC
  Filled 2013-06-23: qty 7.5

## 2013-06-23 MED ORDER — DARUNAVIR ETHANOLATE 800 MG PO TABS
800.0000 mg | ORAL_TABLET | Freq: Every day | ORAL | Status: DC
  Administered 2013-06-24: 800 mg via ORAL
  Filled 2013-06-23 (×2): qty 1

## 2013-06-23 MED ORDER — POTASSIUM CHLORIDE CRYS ER 20 MEQ PO TBCR
EXTENDED_RELEASE_TABLET | ORAL | Status: AC
  Filled 2013-06-23: qty 2

## 2013-06-23 MED ORDER — METRONIDAZOLE IN NACL 5-0.79 MG/ML-% IV SOLN
500.0000 mg | Freq: Three times a day (TID) | INTRAVENOUS | Status: DC
  Administered 2013-06-23 – 2013-06-24 (×3): 500 mg via INTRAVENOUS
  Filled 2013-06-23 (×6): qty 100

## 2013-06-23 NOTE — Progress Notes (Signed)
Patient ID: Alison Chambers, female   DOB: [birth year], 27 y.o.   MRN: [id number]  [facility] PROGRESS NOTE  [name] [id number] DOB: [birth year] DOA: 06/20/2013 PCP: [name], MD  Brief narrative:  27 y.o. female with HIV and frequent and recurrent bouts of otitis media. Her present episode started 5 days prior to this admission. The patient presented to the ED one day prior to this admission with ear pain, found to have otitis media and was discharged on amoxicillin. She returned to the ED with persistent ear pain associated with fevers and chills. Her Otitis media has now ruptured through her ear drum and is draining purulent fluid out her ear. Her amoxicillin was changed to Augmentin and she was referred for an admission by [facility] and ENT consulted bu ED doctor.   Principal Problem:  Otitis media of left ear  - appreciate ENT input, continue to follow upon recommendations  - pt has received Zosyn for 3 days, will transition to ear drop Ciprodex otic solution as recommended by surgery   - follow up with Dr. Ezzard Standing in an outpatient setting recommended in 2 weeks - cultures obtained from left ear pending  Active Problems:  HIV INFECTION  - continue antiretroviral regimen  - CD 4 # pending, last one in 02/2013 was ~90 - ? Medical compliance - pt follows with Dr. Ninetta Lights, ID - continue retroviral regimen  Mastoiditis of left side  - management with otic ABX as noted above as recommended by surgery, pt is allergic to Cipro but unsure what reaction occurs - will attempt Ciprodex and see if pt tolerating well and if not we can change to Tobradex  - left ear culture pending  Abdominal pain with diarrhea  - unclear etiology at this time and per CT abd/pelvis, possibly related to colitis - discussed with [facility] on call, recommendation is to treat with Flagyl only, pt is allergic to Cipro but pt is unaware of the allergy  - continue analgesia as needed  Anemia of chronic  disease  - no signs of active bleed  - CBC in AM  Hypokalemia - mild, will supplement today and repeat BMP in AM Abnormal findings on CT abdomen and pelvis - please see details below - some of the enlarged lymph nodes noted and likely inflammatory in the setting of HIV - CD 4# pending - will consider referral to GYN upon discharge given small cyst noted within the right ovary   Consultants:  ENT Procedures/Studies:  Ct Temporal Bones W/o Cm 06/20/2013 Progressive complete opacification of the left mastoid air cells and middle ear cavity. Opacification of portions of the right mastoid air cells minimally improved. Right middle ear cavity is clear. Almost complete opacification left maxillary sinus. Moderate mucosal thickening right maxillary sinus. Mild mucosal thickening ethmoid sinus air cells and sphenoid sinus air cells bilaterally. As the patient is immunocompromised, atypical infection contributing to the above described findings cannot be excluded. Once again noted is prominence of soft tissue posterior-superior nasopharynx which may contribute to the mastoid opacification. The patient is HIV positive and this may represent presence of lymphoid tissue although lymphoma not excluded as previously noted. Please see above.  CT Abdomen and pelvis 06/22/2013  1.  Mild splenomegaly is noted. The spleen measures 14.8 cm in  length.  2. There is a short segment thickening of the right colonic wall in the cecal region and proximal right colon. Findings are highly suspicious for segmental colitis.  3. Borderline enlarged lymph  node in the right lower quadrant mesentery may be reactive in nature.  4. No hydronephrosis or hydroureter.  5. Normal appendix.  6. Borderline bilateral inguinal lymph nodes may be reactive in nature. Clinical correlation is necessary.  7. Heterogeneous uterus without evidence of discrete fibroids. There is a cervical cyst measures 1.9 x 1.6 cm. There is probable cyst / follicle  within the right ovary measures 2.4 cm.  8. Limited assessment of urinary bladder which is empty. Thickening of urinary bladder wall. Mild cystitis cannot be  excluded.  Antibiotics:  Zosyn 9/7 --> 9/9 Ciprodex 9/9 --> Flagyl 9/9 -->   Code Status: Full  Family Communication: Pt at bedside  Disposition Plan: Home when medically stable   HPI/Subjective: No events overnight.   Objective: Filed Vitals:   06/22/13 0541 06/22/13 1337 06/22/13 2119 06/23/13 0631  BP: 101/60 108/64 103/62 107/68  Pulse: 86 85 85 64  Temp: 98.1 F (36.7 C) 98.1 F (36.7 C) 98.2 F (36.8 C) 97.9 F (36.6 C)  TempSrc: Oral Oral Oral Oral  Resp: 19 17 18 18   Height:      Weight:      SpO2: 100% 100% 100% 100%    Intake/Output Summary (Last 24 hours) at 06/23/13 1248 Last data filed at 06/22/13 2200  Gross per 24 hour  Intake    250 ml  Output      0 ml  Net    250 ml    Exam:   General:  Pt is alert, follows commands appropriately, not in acute distress, left ear area tender to palpation with small amount of serous pus  Cardiovascular: Regular rate and rhythm, S1/S2, no murmurs, no rubs, no gallops  Respiratory: Clear to auscultation bilaterally, no wheezing, no crackles, no rhonchi  Abdomen: Soft, tender in lower abdominal quadrants, non distended, bowel sounds present, no guarding  Extremities: No edema, pulses DP and PT palpable bilaterally  Neuro: Grossly nonfocal  Data Reviewed: Basic Metabolic Panel:  Recent Labs Lab 06/20/13 1850 06/21/13 0425 06/22/13 0505 06/23/13 0533  NA 129* 131* 130* 132*  K 3.3* 3.5 3.5 3.4*  CL 98 101 99 100  CO2 22 24 23 26   GLUCOSE 109* 117* 100* 91  BUN 8 7 8 9   CREATININE 0.57 0.62 0.68 0.71  CALCIUM 8.7 8.4 8.4 8.3*   Liver Function Tests:  Recent Labs Lab 06/20/13 1850  AST 21  ALT 22  ALKPHOS 49  BILITOT 0.9  PROT 9.6*  ALBUMIN 2.8*   CBC:  Recent Labs Lab 06/20/13 1850 06/21/13 0425 06/22/13 0505 06/23/13 0533   WBC 8.7 6.2 4.5 2.4*  NEUTROABS 7.3  --   --   --   HGB 11.4* 11.1* 10.6* 10.5*  HCT 32.6* 32.5* 32.2* 31.8*  MCV 83.4 84.6 85.0 85.0  PLT 213 201 210 201    Scheduled Meds: . [START ON 06/24/2013] darunavir  800 mg Oral Q breakfast  . emtricitabine-tenofovir  1 tablet Oral Daily  . pantoprazole  40 mg Oral Daily  . piperacillin-tazobactam (ZOSYN)  IV  3.375 g Intravenous Q8H  . ritonavir  100 mg Oral Q breakfast   Continuous Infusions:    Debbora Presto, MD  [facility] Pager [phone]  If 7PM-7AM, please contact night-coverage [email or web address] Password [email or web address] 06/23/2013, 12:48 PM   LOS: 3 days

## 2013-06-23 NOTE — Progress Notes (Signed)
Nutrition Brief Note  Malnutrition Screening Tool result is inaccurate.  Please consult if nutrition needs are identified.  Katie Atarah Cadogan, RD, LDN Pager #: [phone] After-Hours Pager #: [phone]  

## 2013-06-24 LAB — CBC
MCHC: 34.1 g/dL (ref 30.0–36.0)
Platelets: 238 10*3/uL (ref 150–400)
RDW: 14.1 % (ref 11.5–15.5)

## 2013-06-24 LAB — BASIC METABOLIC PANEL
GFR calc Af Amer: >90 mL/min (ref >90)
GFR calc non Af Amer: >90 mL/min (ref >90)
Potassium: 3.9 mEq/L (ref 3.5–5.1)
Sodium: 130 mEq/L — ABNORMAL LOW (ref 135–145)

## 2013-06-24 LAB — T-HELPER CELLS (CD4) COUNT (NOT AT ARMC): CD4 % Helper T Cell: 19 % — ABNORMAL LOW (ref 33–55)

## 2013-06-24 MED ORDER — AMOXICILLIN-POT CLAVULANATE 875-125 MG PO TABS: 1.0000 | ORAL_TABLET | Freq: Two times a day (BID) | ORAL | Status: DC

## 2013-06-24 MED ORDER — DARUNAVIR ETHANOLATE 800 MG PO TABS: 800.0000 mg | ORAL_TABLET | Freq: Every day | ORAL | Status: DC

## 2013-06-24 MED ORDER — OXYCODONE-ACETAMINOPHEN 5-325 MG PO TABS: 1.0000 | ORAL_TABLET | ORAL | Status: DC | PRN

## 2013-06-24 MED ORDER — INFLUENZA VAC SPLIT QUAD 0.5 ML IM SUSP: 0.5000 mL | INTRAMUSCULAR | Status: DC

## 2013-06-24 MED ORDER — CIPROFLOXACIN-DEXAMETHASONE 0.3-0.1 % OT SUSP: 2.0000 [drp] | Freq: Two times a day (BID) | OTIC | Status: DC

## 2013-06-24 MED ORDER — INFLUENZA VAC SPLIT QUAD 0.25 ML IM SUSP: 0.2500 mL | INTRAMUSCULAR | Status: DC

## 2013-06-24 MED ORDER — EMTRICITABINE-TENOFOVIR DF 200-300 MG PO TABS: 1.0000 | ORAL_TABLET | Freq: Every day | ORAL | Status: DC

## 2013-06-24 MED ORDER — RITONAVIR 100 MG PO TABS: 100.0000 mg | ORAL_TABLET | Freq: Every day | ORAL | Status: DC

## 2013-06-24 MED ORDER — METRONIDAZOLE 500 MG PO TABS: 500.0000 mg | ORAL_TABLET | Freq: Three times a day (TID) | ORAL | Status: DC

## 2013-06-24 NOTE — Progress Notes (Signed)
06-24-13 Medication assistance [facility] ) letter provided . Patient voiced understanding. Ronny Flurry RN BSN [phone]

## 2013-06-24 NOTE — Progress Notes (Signed)
Discharge home. Home discharge instruction given, no question verbalized. 
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
## 2013-06-25 NOTE — ED Provider Notes (Signed)
History/physical exam/procedure(s) were performed by non-physician practitioner and as supervising physician I was immediately available for consultation/collaboration. I have reviewed all notes and am in agreement with care and plan.   Hilario Quarry, MD 06/25/13 [phone]

## 2013-06-26 LAB — EAR CULTURE: Culture: NO GROWTH

## 2013-07-01 ENCOUNTER — Ambulatory Visit: Payer: Self-pay | Admitting: Infectious Diseases

## 2013-07-06 ENCOUNTER — Encounter: Payer: Self-pay | Admitting: Obstetrics & Gynecology

## 2013-07-13 ENCOUNTER — Encounter: Payer: Self-pay | Admitting: Internal Medicine

## 2013-08-05 ENCOUNTER — Encounter: Payer: Self-pay | Admitting: Infectious Diseases

## 2013-08-05 ENCOUNTER — Ambulatory Visit (INDEPENDENT_AMBULATORY_CARE_PROVIDER_SITE_OTHER): Payer: MEDICAID | Admitting: Infectious Diseases

## 2013-08-05 VITALS — BP 154/94 | HR 72 | Temp 97.7°F | Ht 66.0 in | Wt 211.0 lb

## 2013-08-05 DIAGNOSIS — B20 Human immunodeficiency virus [HIV] disease: Secondary | ICD-10-CM

## 2013-08-05 DIAGNOSIS — R110 Nausea: Secondary | ICD-10-CM

## 2013-08-05 DIAGNOSIS — R6889 Other general symptoms and signs: Secondary | ICD-10-CM

## 2013-08-05 DIAGNOSIS — IMO0002 Reserved for concepts with insufficient information to code with codable children: Secondary | ICD-10-CM

## 2013-08-05 DIAGNOSIS — Z23 Encounter for immunization: Secondary | ICD-10-CM

## 2013-08-05 NOTE — Assessment & Plan Note (Signed)
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 Refer to Gyn.

## 2013-08-05 NOTE — Assessment & Plan Note (Signed)
Gets flu shot today. Refuses condoms. Will check her labs. Will check pregnancy test as well. See her back 3 months.
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
## 2013-08-06 ENCOUNTER — Telehealth: Payer: Self-pay | Admitting: *Deleted

## 2013-08-06 DIAGNOSIS — Z23 Encounter for immunization: Secondary | ICD-10-CM

## 2013-08-06 LAB — HIV-1 RNA ULTRAQUANT REFLEX TO GENTYP+: HIV 1 RNA Quant: 21942 copies/mL — ABNORMAL HIGH (ref <20)

## 2013-08-06 LAB — T-HELPER CELL (CD4) - (RCID CLINIC ONLY): CD4 % Helper T Cell: 12 % — ABNORMAL LOW (ref 33–55)

## 2013-08-06 NOTE — Telephone Encounter (Signed)
Spoke with pt's case manager, [place], regarding appointment 10/22.  Pt's pregnancy test result is negative, but she still needs a referral to the [facility] for an atypical PAP result.  RN was unable to contact patient to relay these results.  RN also relayed concerns about patient's safety and security.  The patient brought an unnamed man with her to her appointment. He maintained a close physical presence to the patient, often speaking for and over her when RN asked questions.  The patient was tearful and noticeably uncomfortable in his presence.  The patient came out of the room to speak with RN in private, stating that the man was a cousin and asking me not to disclose her status to him as no one in her family is aware she is HIV+.  While patient was speaking with me, the man came out of the room to find her. He was on his cell phone speaking to someone in Spanish, saw her crying to me, then left the exam area.  Patient started crying harder, stating that she was sure he knew and would tell everyone.  She got a phone call from him while still in the exam room. RN could overhear someone yelling in Spanish at the patient.  Patient refused to RN's offer of protective action, stating that she was fine. She then saw Dr. Ninetta Lights and had labs drawn.  RN asked the patient as she was leaving the lab area if she was ok, patient stated she was fine. I stated that I would inform [place] of the visit's events. Patient agreed. [place] validated the concerns, stated she felt like this is the same man she has seen with the patient for the last few months.  [place] stated that the patient's phone has been cut off, but that she would visit her today to make sure she is safe, relay the test results, and try to get her to agree to an appointment at [facility].

## 2013-08-09 ENCOUNTER — Emergency Department (HOSPITAL_COMMUNITY)
Admission: EM | Admit: 2013-08-09 | Discharge: 2013-08-10 | Discharge status: Against Medical Advice | Payer: Self-pay | Attending: Emergency Medicine | Admitting: Emergency Medicine

## 2013-08-09 ENCOUNTER — Encounter (HOSPITAL_COMMUNITY): Payer: Self-pay | Admitting: Emergency Medicine

## 2013-08-09 DIAGNOSIS — Z21 Asymptomatic human immunodeficiency virus [HIV] infection status: Secondary | ICD-10-CM | POA: Insufficient documentation

## 2013-08-09 DIAGNOSIS — J029 Acute pharyngitis, unspecified: Secondary | ICD-10-CM | POA: Insufficient documentation

## 2013-08-09 DIAGNOSIS — I10 Essential (primary) hypertension: Secondary | ICD-10-CM | POA: Insufficient documentation

## 2013-08-09 LAB — POCT I-STAT, CHEM 8
Chloride: 103 mEq/L (ref 96–112)
Glucose, Bld: 102 mg/dL — ABNORMAL HIGH (ref 70–99)
HCT: 40 % (ref 36.0–46.0)
Potassium: 3.8 mEq/L (ref 3.5–5.1)
Sodium: 141 mEq/L (ref 135–145)

## 2013-08-09 LAB — CBC WITH DIFFERENTIAL/PLATELET
Basophils Absolute: 0 10*3/uL (ref 0.0–0.1)
HCT: 37 % (ref 36.0–46.0)
Hemoglobin: 12.7 g/dL (ref 12.0–15.0)
Lymphocytes Relative: 31 % (ref 12–46)
Monocytes Absolute: 0.2 10*3/uL (ref 0.1–1.0)
Neutro Abs: 1.6 10*3/uL — ABNORMAL LOW (ref 1.7–7.7)
RDW: 14.5 % (ref 11.5–15.5)
WBC: 2.7 10*3/uL — ABNORMAL LOW (ref 4.0–10.5)

## 2013-08-09 MED ORDER — IBUPROFEN 200 MG PO TABS
400.0000 mg | ORAL_TABLET | Freq: Once | ORAL | Status: AC
  Administered 2013-08-09: 400 mg via ORAL
  Filled 2013-08-09: qty 2

## 2013-08-09 MED ORDER — ONDANSETRON 4 MG PO TBDP
8.0000 mg | ORAL_TABLET | Freq: Once | ORAL | Status: AC
  Administered 2013-08-09: 8 mg via ORAL
  Filled 2013-08-09: qty 2

## 2013-08-09 NOTE — ED Notes (Signed)
No answer, not in w/r 

## 2013-08-09 NOTE — ED Notes (Signed)
No answer when called to take to treatment room

## 2013-08-09 NOTE — ED Notes (Addendum)
C/o sore throat. Also nausea. Alert, NAD, calm, interactive. Denies fever. Speaks spanish, some [place], family translating. H/o HIV & DM, reports [redacted] weeks pregnant. Took liquid tylenol PTA.

## 2013-08-10 NOTE — ED Notes (Signed)
No answer not seen in waiting room

## 2013-08-11 LAB — CULTURE, GROUP A STREP

## 2013-08-12 LAB — HIV-1 GENOTYPR PLUS

## 2013-08-21 ENCOUNTER — Other Ambulatory Visit: Payer: Self-pay | Admitting: *Deleted

## 2013-08-21 DIAGNOSIS — B20 Human immunodeficiency virus [HIV] disease: Secondary | ICD-10-CM

## 2013-08-24 ENCOUNTER — Other Ambulatory Visit: Payer: Self-pay

## 2013-08-27 ENCOUNTER — Encounter: Payer: Self-pay | Admitting: *Deleted

## 2013-08-27 NOTE — Progress Notes (Signed)
Pt No Showed for her first referral appt in Sept., 2014.  She has another appt scheduled for December.  I will call to remind her of the appt and send her a letter in Spanish with the information.

## 2013-09-02 ENCOUNTER — Encounter: Payer: Self-pay | Admitting: *Deleted

## 2013-09-16 ENCOUNTER — Encounter: Payer: Self-pay | Admitting: Obstetrics & Gynecology

## 2013-09-24 ENCOUNTER — Emergency Department (HOSPITAL_COMMUNITY): Payer: Self-pay

## 2013-09-24 ENCOUNTER — Encounter (HOSPITAL_COMMUNITY): Payer: Self-pay | Admitting: Emergency Medicine

## 2013-09-24 ENCOUNTER — Emergency Department (HOSPITAL_COMMUNITY)
Admission: EM | Admit: 2013-09-24 | Discharge: 2013-09-24 | Discharge status: Against Medical Advice | Payer: Self-pay | Attending: Emergency Medicine | Admitting: Emergency Medicine

## 2013-09-24 DIAGNOSIS — J309 Allergic rhinitis, unspecified: Secondary | ICD-10-CM | POA: Insufficient documentation

## 2013-09-24 DIAGNOSIS — R599 Enlarged lymph nodes, unspecified: Secondary | ICD-10-CM | POA: Insufficient documentation

## 2013-09-24 DIAGNOSIS — R059 Cough, unspecified: Secondary | ICD-10-CM | POA: Insufficient documentation

## 2013-09-24 DIAGNOSIS — R5381 Other malaise: Secondary | ICD-10-CM | POA: Insufficient documentation

## 2013-09-24 DIAGNOSIS — Z888 Allergy status to other drugs, medicaments and biological substances status: Secondary | ICD-10-CM | POA: Insufficient documentation

## 2013-09-24 DIAGNOSIS — R55 Syncope and collapse: Secondary | ICD-10-CM | POA: Insufficient documentation

## 2013-09-24 DIAGNOSIS — J029 Acute pharyngitis, unspecified: Secondary | ICD-10-CM | POA: Insufficient documentation

## 2013-09-24 DIAGNOSIS — Z885 Allergy status to narcotic agent status: Secondary | ICD-10-CM | POA: Insufficient documentation

## 2013-09-24 DIAGNOSIS — R509 Fever, unspecified: Secondary | ICD-10-CM | POA: Insufficient documentation

## 2013-09-24 DIAGNOSIS — Z3202 Encounter for pregnancy test, result negative: Secondary | ICD-10-CM | POA: Insufficient documentation

## 2013-09-24 DIAGNOSIS — M6281 Muscle weakness (generalized): Secondary | ICD-10-CM | POA: Insufficient documentation

## 2013-09-24 DIAGNOSIS — IMO0001 Reserved for inherently not codable concepts without codable children: Secondary | ICD-10-CM | POA: Insufficient documentation

## 2013-09-24 DIAGNOSIS — R05 Cough: Secondary | ICD-10-CM | POA: Insufficient documentation

## 2013-09-24 DIAGNOSIS — I10 Essential (primary) hypertension: Secondary | ICD-10-CM | POA: Insufficient documentation

## 2013-09-24 DIAGNOSIS — Y990 Civilian activity done for income or pay: Secondary | ICD-10-CM | POA: Insufficient documentation

## 2013-09-24 DIAGNOSIS — J3489 Other specified disorders of nose and nasal sinuses: Secondary | ICD-10-CM | POA: Insufficient documentation

## 2013-09-24 DIAGNOSIS — Z21 Asymptomatic human immunodeficiency virus [HIV] infection status: Secondary | ICD-10-CM | POA: Insufficient documentation

## 2013-09-24 DIAGNOSIS — Y929 Unspecified place or not applicable: Secondary | ICD-10-CM | POA: Insufficient documentation

## 2013-09-24 DIAGNOSIS — M791 Myalgia, unspecified site: Secondary | ICD-10-CM

## 2013-09-24 DIAGNOSIS — Z881 Allergy status to other antibiotic agents status: Secondary | ICD-10-CM | POA: Insufficient documentation

## 2013-09-24 DIAGNOSIS — Z79899 Other long term (current) drug therapy: Secondary | ICD-10-CM | POA: Insufficient documentation

## 2013-09-24 DIAGNOSIS — R296 Repeated falls: Secondary | ICD-10-CM | POA: Insufficient documentation

## 2013-09-24 DIAGNOSIS — R42 Dizziness and giddiness: Secondary | ICD-10-CM | POA: Insufficient documentation

## 2013-09-24 DIAGNOSIS — R51 Headache: Secondary | ICD-10-CM | POA: Insufficient documentation

## 2013-09-24 LAB — BASIC METABOLIC PANEL
CO2: 23 mEq/L (ref 19–32)
Chloride: 100 mEq/L (ref 96–112)
GFR calc Af Amer: >90 mL/min (ref >90)
Glucose, Bld: 103 mg/dL — ABNORMAL HIGH (ref 70–99)
Potassium: 3.2 mEq/L — ABNORMAL LOW (ref 3.5–5.1)
Sodium: 133 mEq/L — ABNORMAL LOW (ref 135–145)

## 2013-09-24 LAB — PREGNANCY, URINE: Preg Test, Ur: NEGATIVE

## 2013-09-24 LAB — CBC WITH DIFFERENTIAL/PLATELET
Basophils Relative: 0 % (ref 0–1)
Eosinophils Relative: 0 % (ref 0–5)
Lymphocytes Relative: 18 % (ref 12–46)
Lymphs Abs: 0.7 10*3/uL (ref 0.7–4.0)
MCV: 86.5 fL (ref 78.0–100.0)
Neutrophils Relative %: 75 % (ref 43–77)
Platelets: 239 10*3/uL (ref 150–400)
RBC: 4.21 MIL/uL (ref 3.87–5.11)
WBC: 3.9 10*3/uL — ABNORMAL LOW (ref 4.0–10.5)

## 2013-09-24 LAB — URINALYSIS, ROUTINE W REFLEX MICROSCOPIC
Bilirubin Urine: NEGATIVE
Glucose, UA: NEGATIVE mg/dL
Specific Gravity, Urine: 1.029 (ref 1.005–1.030)
Urobilinogen, UA: 0.2 mg/dL (ref 0.0–1.0)
pH: 6 (ref 5.0–8.0)

## 2013-09-24 LAB — RAPID STREP SCREEN (MED CTR MEBANE ONLY): Streptococcus, Group A Screen (Direct): NEGATIVE

## 2013-09-24 NOTE — ED Notes (Addendum)
Presents with generalized body aches and flu like symptoms began one week ago. Pt is HIV +, family does not know, does not want it talked about with others in room.  Denies SOB, reports non productive cough, runny nose and sore throat.

## 2013-09-24 NOTE — ED Notes (Signed)
PA at bedside. Pt refused Influenza Panel. Pt states she needs to leave to go pick up child. Pt. Leaving AMA. PA aware.

## 2013-09-24 NOTE — ED Notes (Signed)
Pt states she has been ill for one week, pt reports generalized body aches, a non productive cough, nasal congestion, dizziness. Pt states she had a syncopal episode today, pt states she does not know how long she passed out for. Pt does not remember the event except that she woke up on the ground.
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
## 2013-09-25 LAB — CULTURE, GROUP A STREP

## 2013-10-03 NOTE — ED Provider Notes (Signed)
Medical screening examination/treatment/procedure(s) were performed by non-physician practitioner and as supervising physician I was immediately available for consultation/collaboration.   Wiliam Cauthorn, MD 10/03/13 0500 

## 2013-10-14 ENCOUNTER — Emergency Department (HOSPITAL_COMMUNITY)
Admission: EM | Admit: 2013-10-14 | Discharge: 2013-10-14 | Discharge status: Against Medical Advice | Payer: Self-pay | Attending: Emergency Medicine | Admitting: Emergency Medicine

## 2013-10-14 ENCOUNTER — Encounter (HOSPITAL_COMMUNITY): Payer: Self-pay | Admitting: Emergency Medicine

## 2013-10-14 DIAGNOSIS — I10 Essential (primary) hypertension: Secondary | ICD-10-CM | POA: Insufficient documentation

## 2013-10-14 DIAGNOSIS — R52 Pain, unspecified: Secondary | ICD-10-CM | POA: Insufficient documentation

## 2013-10-14 DIAGNOSIS — Z21 Asymptomatic human immunodeficiency virus [HIV] infection status: Secondary | ICD-10-CM | POA: Insufficient documentation

## 2013-10-14 DIAGNOSIS — J3489 Other specified disorders of nose and nasal sinuses: Secondary | ICD-10-CM | POA: Insufficient documentation

## 2013-10-14 NOTE — ED Notes (Signed)
Pt. reports generalized body aches with runny nose and occasional dry cough onset yesterday morning .

## 2013-10-14 NOTE — ED Notes (Signed)
Pt stated that she did not want to saty anymore, and that she would just take some OTC medications.  Pt informed of the possible issue with her insurance not covering the cost of the visit and was advised that it was in her best interest to be seen by the MD.  Pt signed refusal and left AMA.

## 2013-11-17 ENCOUNTER — Encounter: Payer: Self-pay | Admitting: *Deleted

## 2013-11-18 ENCOUNTER — Telehealth: Payer: Self-pay | Admitting: Infectious Diseases

## 2013-11-18 NOTE — Telephone Encounter (Signed)
Left message for pt to make appt.

## 2013-12-11 ENCOUNTER — Encounter (HOSPITAL_COMMUNITY): Payer: Self-pay | Admitting: Emergency Medicine

## 2013-12-11 DIAGNOSIS — R221 Localized swelling, mass and lump, neck: Secondary | ICD-10-CM

## 2013-12-11 DIAGNOSIS — I10 Essential (primary) hypertension: Secondary | ICD-10-CM | POA: Insufficient documentation

## 2013-12-11 DIAGNOSIS — R079 Chest pain, unspecified: Secondary | ICD-10-CM | POA: Insufficient documentation

## 2013-12-11 DIAGNOSIS — R52 Pain, unspecified: Secondary | ICD-10-CM | POA: Insufficient documentation

## 2013-12-11 DIAGNOSIS — Z21 Asymptomatic human immunodeficiency virus [HIV] infection status: Secondary | ICD-10-CM | POA: Insufficient documentation

## 2013-12-11 DIAGNOSIS — R220 Localized swelling, mass and lump, head: Secondary | ICD-10-CM | POA: Insufficient documentation

## 2013-12-11 NOTE — ED Notes (Signed)
C/o generalized body aches x 1 week and swelling to L side of face.  Also reports sharp pain in center of chest with sob x 3 days.

## 2013-12-12 ENCOUNTER — Emergency Department (HOSPITAL_COMMUNITY)
Admission: EM | Admit: 2013-12-12 | Discharge: 2013-12-12 | Discharge status: Against Medical Advice | Payer: Self-pay | Attending: Emergency Medicine | Admitting: Emergency Medicine

## 2013-12-12 ENCOUNTER — Emergency Department (HOSPITAL_COMMUNITY): Payer: Self-pay

## 2013-12-12 LAB — CBC
HEMATOCRIT: 34 % — AB (ref 36.0–46.0)
Hemoglobin: 11.7 g/dL — ABNORMAL LOW (ref 12.0–15.0)
MCH: 29.4 pg (ref 26.0–34.0)
MCHC: 34.4 g/dL (ref 30.0–36.0)
MCV: 85.4 fL (ref 78.0–100.0)
PLATELETS: 223 10*3/uL (ref 150–400)
RBC: 3.98 MIL/uL (ref 3.87–5.11)
RDW: 13.8 % (ref 11.5–15.5)
WBC: 2.5 10*3/uL — AB (ref 4.0–10.5)

## 2013-12-12 LAB — BASIC METABOLIC PANEL
BUN: 14 mg/dL (ref 6–23)
CO2: 24 mEq/L (ref 19–32)
Calcium: 8.5 mg/dL (ref 8.4–10.5)
Chloride: 99 mEq/L (ref 96–112)
Creatinine, Ser: 0.82 mg/dL (ref 0.50–1.10)
GFR calc non Af Amer: >90 mL/min (ref >90)
Glucose, Bld: 107 mg/dL — ABNORMAL HIGH (ref 70–99)
POTASSIUM: 3.6 meq/L — AB (ref 3.7–5.3)
Sodium: 134 mEq/L — ABNORMAL LOW (ref 137–147)

## 2013-12-12 LAB — PRO B NATRIURETIC PEPTIDE: PRO B NATRI PEPTIDE: 30.8 pg/mL (ref 0–125)

## 2013-12-12 LAB — I-STAT TROPONIN, ED: Troponin i, poc: 0 ng/mL (ref 0.00–0.08)

## 2013-12-12 NOTE — ED Notes (Signed)
No answer in waiting area.

## 2013-12-12 NOTE — ED Notes (Signed)
No answer in waiting room 

## 2013-12-12 NOTE — ED Notes (Signed)
No answer in triage waiting area per Onalee Huaavid, EMT

## 2013-12-22 ENCOUNTER — Emergency Department (HOSPITAL_COMMUNITY)
Admission: EM | Admit: 2013-12-22 | Discharge: 2013-12-23 | Disposition: A | Discharge status: Home / Self Care | Payer: Self-pay | Attending: Emergency Medicine | Admitting: Emergency Medicine

## 2013-12-22 ENCOUNTER — Encounter (HOSPITAL_COMMUNITY): Payer: Self-pay | Admitting: Emergency Medicine

## 2013-12-22 DIAGNOSIS — Z21 Asymptomatic human immunodeficiency virus [HIV] infection status: Secondary | ICD-10-CM | POA: Insufficient documentation

## 2013-12-22 DIAGNOSIS — S5010XA Contusion of unspecified forearm, initial encounter: Secondary | ICD-10-CM | POA: Insufficient documentation

## 2013-12-22 DIAGNOSIS — I10 Essential (primary) hypertension: Secondary | ICD-10-CM | POA: Insufficient documentation

## 2013-12-22 DIAGNOSIS — S40029A Contusion of unspecified upper arm, initial encounter: Secondary | ICD-10-CM

## 2013-12-22 DIAGNOSIS — Z87828 Personal history of other (healed) physical injury and trauma: Secondary | ICD-10-CM | POA: Insufficient documentation
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
## 2013-12-23 ENCOUNTER — Emergency Department (HOSPITAL_COMMUNITY): Payer: Self-pay

## 2013-12-23 NOTE — ED Provider Notes (Signed)
Medical screening examination/treatment/procedure(s) were performed by non-physician practitioner and as supervising physician I was immediately available for consultation/collaboration.    Shemeika Starzyk, MD 12/23/13 0550 
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
## 2013-12-23 NOTE — Discharge Instructions (Signed)
Your x-rays do not show any broken bones. Use Tylenol or ibuprofen for pain. Followup with a primary care provider for continued evaluation and treatment.     Contusin  (Contusion)  Una contusin es un hematoma interno. Las contusiones son el resultado de un traumatismo que produce un sangrado debajo de la piel. La contusin Clear Channel Communicationspuede volverse azul, prpura o [place]. Un traumatismo menor ocasionar un hematoma indoloro, pero las contusiones ms importantes pueden doler y Personal assistantpermanecer hinchadas durante varias semanas.  CAUSAS  Generalmente la causa de la contusin es un golpe, un traumatismo o una fuerza directa ejercida en una zona del cuerpo.  SNTOMAS   Hinchazn y enrojecimiento en la zona lesionada.  Hematoma en la zona lesionada.  Sensibilidad e inflamacin en la zona lesionada.  Dolor. DIAGNSTICO  El diagnstico puede hacerse a travs de la historia clnica y el examen fsico. Ser necesaria una radiografa o una tomografa computada para determinar si hay lesiones asociadas, como fracturas.  TRATAMIENTO  El tratamiento especfico depender de qu parte del cuerpo se lesion. En general, el mejor tratamiento para una contusin es el reposo, hielo, elevacin, y la aplicacin de compresas fras en el rea afectada. Tambin se recomiendan los medicamentos de venta libre para el control del dolor. Pregunte a su mdico cul es el mejor tratamiento para su contusin.  INSTRUCCIONES PARA EL CUIDADO EN EL HOGAR   Aplique hielo sobre la zona lesionada.  Ponga el hielo en una bolsa plstica.  Colquese una toalla entre la piel y la bolsa de hielo.  Deje el hielo durante 15 a 20 minutos, 3 a 4 veces por da.  Slo tome medicamentos de venta libre o recetados para Primary school teachercalmar el dolor, las molestias o bajar la fiebre segn las indicaciones de su mdico. El mdico puede indicarle que evite los antiinflamatorios (aspirina, ibuprofeno y naproxeno) durante 48 horas debido a que estos medicamentos  pueden aumentar el hematoma.  Haga que la zona lesionada repose.  En lo posible, eleve la zona lesionada para disminuir la hinchazn. SOLICITE ATENCIN MDICA DE INMEDIATO SI:   El hematoma o la hinchazn aumentan.  Siente que Community education officerel dolor empeora.  El dolor o la hinchazn no se alivian con los medicamentos. ASEGRESE DE QUE:   Comprende estas instrucciones.  Controlar su enfermedad.  Solicitar ayuda de inmediato si no mejora o si empeora. Document Released: 07/11/2005 Document Revised: 12/24/2011 [facility]ExitCare Patient Information 2014 [place]Care, [place].    Agresin fsica (Assault, General) Una agresin incluye toda conducta, ya sea intencional o imprudente, que produce como resultado una lesin fsica a otra persona, un dao a la propiedad, o ambas cosas. Aqu se incluye cualquier conducta, intencional o imprudente, que por su naturaleza puede ser comprendida (interpretada) por una persona razonable como un intento de daar a otra persona o a su propiedad. La amenaza puede ser oral o escrita. Puede ser comunicada a travs de correo tradicional, por computadora, fax o telfono. Estas amenazas pueden ser directas o implcitas. ALGUNAS FORMAS DE AGRESIN INCLUYEN:  La agresin fsica a Medical laboratory scientific officeruna persona. Esto incluye tanto amenazas fsicas de infringir dao fsico como tambin:  Abofetear.  Golpear.  Pinchar.  Patear.  Golpes de puo.  Empujar.  Incendio provocado.  Sabotaje.  Daos o destruccin de la propiedad.  Arrojar o golpear objetos.  Vandalismo.  Ensear un arma o un objeto que parezca ser un arma de [place]manera amenazante.  Llevar un arma de fuego de cualquier tipo.  Utilizar un arma para lastimar a alguien.  Utilizar un  mayor tamao o fuerza fsica para intimidar a Therapist, art.  Realizar gestos intimidatorios o amenazantes.  Intimidar.  Ritos de iniciacin violentos.  El lenguaje intimidatorio, [place], hostil o abusivo dirigido a Engineer, maintenance (IT).  Comunica la  intencin de utilizar violencia contra esa persona. Y lleva a una persona razonable a esperar que tenga lugar un comportamiento violento.  Acechar al Dannielle Burn. SI OCURRE NUEVAMENTE:  Si esto ocurriera nuevamente, pida inmediatamente ayuda de emergencia (comunquese al 911 en los EE.[place].).  Si alguna persona constituye una amenaza clara e inmediata para su seguridad, busque que las autoridades legales dicten una medida judicial de proteccin o que impidan que esa persona se acerque a usted.  Las agresiones [employer] pueden ser, al menos, informadas a las autoridades. PASOS A SEGUIR SI HA OCURRIDO UNA AGRESIN SEXUAL  Dirjase a un rea segura. Puede ser un refugio o Lennie Hummer con [employer]. Aljese del rea donde usted ha sido atacado. Un gran porcentaje de las agresiones sexuales son llevadas a cabo por un amigo, un pariente o un socio.  Si el profesional que le asiste le indic medicamentos, tmelos como se los ha prescrito durante todo el tiempo indicado.  Slo tome medicamentos de Sales promotion account executive o de prescripcin para Chief Technology Officer, Environmental health practitioner o la fiebre, segn le haya indicado el profesional que le asiste.  Si ha entrado en contacto con una enfermedad sexual, averige si se le practicarn pruebas nuevamente. Si el profesional que le asiste est preocupado por el virus del VIH/SIDA, podr solicitarle que contine con las pruebas durante algunos meses.  Para proteger su privacidad, los Levi Strauss no sern dados por telfono. Asegrese de [employer] Corporation de sus exmenes. Si los Norfolk Southern de los exmenes no estn disponibles durante su visita, arregle una cita con el profesional que le asiste para [employer] Corporation. No piense que el resultado es normal si esta informacin no se la brinda el profesional que lo asiste o el establecimiento mdico. Es importante que [employer] del [place].  Presente la documentacin apropiada a las autoridades. Esto es  [employer] en todos los casos de agresin, incluso en el caso de que hayan ocurrido dentro del grupo familiar o hayan sido cometidos por [employer]. SOLICITE ATENCIN MDICA SI:  Tiene nuevos problemas debido a las lesiones.  Tiene algn problema que pueda relacionarse con la medicina que est tomando, tal como:  Erupciones.  Picazn.  Hinchazn.  Problemas para respirar.  Siente dolor de vientre (abdominal), nuseas o si tiene vmitos.  Presenta fiebre.  Necesita apoyo o una remisin a un centro de asistencia para vctimas de violacin. Estos son centros con personal entrenado que pueden ayudarle a superar esta dura experiencia. SOLICITE ATENCIN MDICA DE INMEDIATO SI:  Teme ser amenazado, golpeado o abusado. En los EE.[place]., llame al 911.  Recibe nuevas lesiones relacionadas con abuso.  Comienza a sentir Herbalist intenso en alguna de las zonas lesionadas u observa alguna modificacin en su estado que lo preocupa.  Se desmaya o pierde la conciencia.  Siente falta de aire o Journalist, newspaper. Document Released: 10/01/2005 Document Revised: 12/24/2011 [facility] Patient Information 2014 [place], [place].

## 2013-12-24 ENCOUNTER — Telehealth: Payer: Self-pay | Admitting: *Deleted

## 2013-12-24 ENCOUNTER — Other Ambulatory Visit: Payer: Self-pay | Admitting: *Deleted

## 2013-12-24 NOTE — Telephone Encounter (Signed)
ADAP approved # [id number]. No medications on file; removed when patient was last seen in the ED. [facility] and the last time patient refilled her HIV meds was 11/2012. Called patient and left a voice mail to call the clinic to schedule a lab and MD appointment. She was previously on Truvada, Prezista, and Norvir. Wendall MolaJacqueline Taniqua Chambers

## 2013-12-24 NOTE — Telephone Encounter (Signed)
Patient called stating she is having all over body aches and believes she has a fever, although she did not check her temperature. Offered her an appointment for tomorrow at 9:00 AM, but she said she has to be in court. She felt she needed to go to the ED. Advised to try ibuprofen and if she still feels she needs to be seen to go to the Urgent Care versus the ED. Wendall MolaJacqueline Cockerham

## 2013-12-29 ENCOUNTER — Other Ambulatory Visit: Payer: Self-pay | Admitting: *Deleted

## 2013-12-29 DIAGNOSIS — B20 Human immunodeficiency virus [HIV] disease: Secondary | ICD-10-CM

## 2013-12-29 MED ORDER — EMTRICITABINE-TENOFOVIR DF 200-300 MG PO TABS
1.0000 | ORAL_TABLET | Freq: Every day | ORAL | Status: DC
Start: 2013-12-29 — End: 2014-04-27

## 2013-12-29 MED ORDER — DARUNAVIR ETHANOLATE 800 MG PO TABS: 800.0000 mg | ORAL_TABLET | Freq: Every day | ORAL | Status: DC

## 2013-12-29 MED ORDER — RITONAVIR 100 MG PO TABS: 100.0000 mg | ORAL_TABLET | Freq: Every day | ORAL | Status: DC

## 2013-12-29 NOTE — Telephone Encounter (Signed)
[name] Left patient a voice mail to please call the clinic to schedule a follow up appt. Alison MolaJacqueline Jagger Demonte

## 2013-12-29 NOTE — Telephone Encounter (Signed)
She needs MD appt She can restart darunavir, norvir, truvada thanks

## 2014-02-22 ENCOUNTER — Emergency Department (HOSPITAL_COMMUNITY)
Admission: EM | Admit: 2014-02-22 | Discharge: 2014-02-22 | Disposition: A | Discharge status: Home / Self Care | Payer: Self-pay | Attending: Emergency Medicine | Admitting: Emergency Medicine

## 2014-02-22 ENCOUNTER — Encounter (HOSPITAL_COMMUNITY): Payer: Self-pay | Admitting: Emergency Medicine

## 2014-02-22 DIAGNOSIS — Z87828 Personal history of other (healed) physical injury and trauma: Secondary | ICD-10-CM | POA: Insufficient documentation

## 2014-02-22 DIAGNOSIS — I10 Essential (primary) hypertension: Secondary | ICD-10-CM | POA: Insufficient documentation

## 2014-02-22 DIAGNOSIS — Z79899 Other long term (current) drug therapy: Secondary | ICD-10-CM | POA: Insufficient documentation

## 2014-02-22 DIAGNOSIS — Z791 Long term (current) use of non-steroidal anti-inflammatories (NSAID): Secondary | ICD-10-CM | POA: Insufficient documentation

## 2014-02-22 DIAGNOSIS — B354 Tinea corporis: Secondary | ICD-10-CM | POA: Insufficient documentation

## 2014-02-22 DIAGNOSIS — Z21 Asymptomatic human immunodeficiency virus [HIV] infection status: Secondary | ICD-10-CM | POA: Insufficient documentation

## 2014-02-22 MED ORDER — CLOTRIMAZOLE 1 % EX CREA: TOPICAL_CREAM | CUTANEOUS | Status: DC
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
## 2014-02-22 NOTE — ED Notes (Signed)
Patient complains of circular looking rash x 1 week to R shoulder area.  Patient claims itching and pain.

## 2014-02-22 NOTE — ED Provider Notes (Signed)
Medical screening examination/treatment/procedure(s) were performed by non-physician practitioner and as supervising physician I was immediately available for consultation/collaboration.   EKG Interpretation None        Emryn Flanery, MD 02/22/14 1611 

## 2014-02-22 NOTE — Discharge Instructions (Signed)
Please follow up with your primary care physician in 1-2 days. If you do not have one please call the [facility] number listed above. Please use Lotrimin cream as prescribed for up to four weeks to help get rid of infection. Please have your primary care doctor recheck your rash in a week or two. .jdpc   Tia corporal  (Body Ringworm) La tia corporal (tinea corporis) es una infeccin por hongos en la piel del cuerpo. La causa de esta infeccin no son gusanos, sino un hongo. Los hongos normalmente viven en la superficie de la piel y pueden ser tiles. Sin embargo, en el caso de la Copperopolistia, los hongos crecen de Breckenridgemanera descontrolada y causan una infeccin en la piel. Puede afectar a cualquier zona de la piel del cuerpo y puede propagarse fcilmente de Neomia Dearuna persona a otra (es contagiosa). La tia es un problema frecuente en los nios, pero tambin puede afectar a los adultos. Tambin generalmente la sufren los atletas, en especial en los luchadores que comparten equipos y colchonetas.  CAUSAS  La causa de la tia corporal es un hongo llamado dermatofito. Se puede propagar a travs de:   Contacto con Nucor Corporationotras personas infectadas.  Contacto con mascotas infectadas.  Tocar o compartir objetos que BorgWarnerhayan estado en contacto con una persona o con una mascota infectada (sombreros, peines, toallas, ropa, artculos deportivos). SNTOMAS   Picazn, manchas rojas elevadas o bultos en la piel.  Erupcin en forma de anillos.  Enrojecimiento cerca del borde de la erupcin con un centro claro.  Piel seca y escamosa dentro o alrededor de la erupcin. No todas las personas tienen una erupcin en forma de Magnetanillo. Algunos desarrollan slo manchas rojas y escamosas.  DIAGNSTICO  Generalmente, la tia puede diagnosticarse mediante la realizacin de un examen de la piel. El mdico puede optar por realizar un raspado de la piel de la zona afectada. La muestra se examinar con un microscopio para  determinar si hay hongos. TRATAMIENTO  La tia corporal puede tratarse con una crema o ungento antifngico tpico. En algunos casos, se indica un champ antihongos para el cuerpo. Podrn recetarle medicamentos antimicticos para tomar por boca si la tia es grave, si reaparece o si se prolonga por mucho tiempo.  INSTRUCCIONES PARA EL CUIDADO EN EL HOGAR   Tome slo medicamentos de venta libre o recetados, segn las indicaciones del mdico.  Verdie DrownLave el rea afectada y seque bien antes de aplicar la crema o la pomada.  Cuando use el champ antimictico para tratar la tia, deje el Levi Strausschamp sobre el cuerpo durante 3 a 5 minutos antes de enjuagar.   Use ropa suelta para evitar roces e irritacin en la erupcin.  Lave o cambie sus sbanas cada noche mientras tiene la erupcin.  Si su mascota tiene la misma infeccin, hgalo tratar por un veterinario. Para prevenir la tia corporal:   Mantenga una buena higiene.  Use sandalias o zapatos en lugares pblicos y duchas.  No comparta artculos personales con Nucor Corporationotras personas.  Evite tocar las [address]manchas rojas de piel de Economistotras personas.  Evite tocar las Auto-Owners Insurancemascotas que tienen zonas sin pelos o lvese las manos despus de tocarlo. SOLICITE ATENCIN MDICA SI:   La erupcin contina diseminndose despus de 7 das de Pauldingtratamiento.  La erupcin no se cura en el trmino de 4 semanas.  El rea alrededor de la erupcin se vuelve roja, se hincha o duele. Document Released: 07/11/2005 Document Revised: 06/25/2012 [facility] Patient Information 2014 [place], [place].

## 2014-04-09 ENCOUNTER — Encounter: Payer: Self-pay | Admitting: *Deleted

## 2014-04-27 ENCOUNTER — Encounter (HOSPITAL_COMMUNITY): Payer: Self-pay | Admitting: Emergency Medicine

## 2014-04-27 ENCOUNTER — Emergency Department (HOSPITAL_COMMUNITY)
Admission: EM | Admit: 2014-04-27 | Discharge: 2014-04-27 | Discharge status: Against Medical Advice | Payer: Self-pay | Attending: Emergency Medicine | Admitting: Emergency Medicine

## 2014-04-27 DIAGNOSIS — R220 Localized swelling, mass and lump, head: Secondary | ICD-10-CM | POA: Insufficient documentation

## 2014-04-27 DIAGNOSIS — Z21 Asymptomatic human immunodeficiency virus [HIV] infection status: Secondary | ICD-10-CM | POA: Insufficient documentation

## 2014-04-27 DIAGNOSIS — I10 Essential (primary) hypertension: Secondary | ICD-10-CM | POA: Insufficient documentation

## 2014-04-27 DIAGNOSIS — R221 Localized swelling, mass and lump, neck: Principal | ICD-10-CM

## 2014-04-27 NOTE — ED Notes (Signed)
Pt ate fish at a birthday party tonight and about two hours after she got home her neck and face started to swell, no fish allergy hx

## 2014-05-05 ENCOUNTER — Encounter (HOSPITAL_COMMUNITY): Payer: Self-pay | Admitting: Emergency Medicine

## 2014-05-05 ENCOUNTER — Emergency Department (HOSPITAL_COMMUNITY)
Admission: EM | Admit: 2014-05-05 | Discharge: 2014-05-05 | Disposition: A | Discharge status: Home / Self Care | Payer: Self-pay | Attending: Emergency Medicine | Admitting: Emergency Medicine

## 2014-05-05 DIAGNOSIS — I10 Essential (primary) hypertension: Secondary | ICD-10-CM | POA: Insufficient documentation

## 2014-05-05 DIAGNOSIS — M436 Torticollis: Secondary | ICD-10-CM | POA: Insufficient documentation

## 2014-05-05 DIAGNOSIS — Z87828 Personal history of other (healed) physical injury and trauma: Secondary | ICD-10-CM | POA: Insufficient documentation

## 2014-05-05 DIAGNOSIS — Z880 Allergy status to penicillin: Secondary | ICD-10-CM | POA: Insufficient documentation

## 2014-05-05 DIAGNOSIS — M549 Dorsalgia, unspecified: Secondary | ICD-10-CM | POA: Insufficient documentation

## 2014-05-05 DIAGNOSIS — Z21 Asymptomatic human immunodeficiency virus [HIV] infection status: Secondary | ICD-10-CM | POA: Insufficient documentation

## 2014-05-05 MED ORDER — CYCLOBENZAPRINE HCL 5 MG PO TABS: 5.0000 mg | ORAL_TABLET | Freq: Three times a day (TID) | ORAL | Status: DC | PRN

## 2014-05-05 MED ORDER — IBUPROFEN 800 MG PO TABS
800.0000 mg | ORAL_TABLET | Freq: Once | ORAL | Status: AC
  Administered 2014-05-05: 800 mg via ORAL
  Filled 2014-05-05: qty 1

## 2014-05-05 NOTE — ED Notes (Signed)
P c/o stiff neck and right sided back pain that started this morning.
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
## 2014-05-05 NOTE — Discharge Instructions (Signed)
Tortcolis (Torticollis, Acute) Usted presenta el cuello torcido (tortcolis), lo que ha ocurrido de forma repentina (aguda). Se trata de una enfermedad autolimitada. CAUSAS El tortcolis agudo puede tener su causa en una mala postura, un traumatismo o una infeccin. Por lo general, el tortcolis est causado por dormir en una mala posicin. Tambin podra estar ocasionado por la flexin, extensin o torcedura de los msculos del cuello ms all de su posicin normal. A veces, la causa exacta no se conoce. SNTOMAS Generalmente hay dolor y movimiento limitado del cuello. El cuello puede estar inclinado hacia un lado. DIAGNSTICO Generalmente el diagnstico se realiza luego del examen fsico. Si hay antecedentes de traumatismos o preocupacin por la existencia de una infeccin, se solicitarn radiografas, una tomografa computada o resonancia magntica. TRATAMIENTO Para el cuello rgido que se ha producido por una mala posicin al dormir, el tratamiento est enfocado a la relajacin del msculo contracturado del cuello. Pueden indicarle medicamentos (incluso inyecciones) para tratar el problema. La mayora de los casos se resuelven luego de varios das. El tortcolis responde bien a la fisioterapia conservadora. Si se deja sin tratar, el acortamiento y la espasticidad de los msculos del cuello pueden producir deformidades fsicas en el rostro y el cuello. Raras veces es necesario someterse a una ciruga. INSTRUCCIONES PARA EL CUIDADO DOMICILIARIO  Tome slo medicamentos de venta libre o prescriptos segn las indicaciones del mdico.  Realice ejercicios de estiramiento y masajes en el cuello del modo en que le ha indicado el profesional que lo asiste.  Realice un tratamiento de fisioterapia segn le indique el profesional que lo asiste. SOLICITE ATENCIN MDICA INMEDIATAMENTE SI:  Tiene dificultad para respirar o respira con ruido (estridor).  Babea, tiene problemas o dolor al  tragar.  Presenta hormigueo o debilidad en las manos o en los pies.  Observa cambios en el habla o en la visin.  Tiene problemas para orinar o mover el vientre.  Tiene problemas para caminar.  Tiene fiebre.  Aumento del dolor. ASEGRESE QUE:  Comprende estas instrucciones.  Controlar su enfermedad.  Solicitar ayuda de inmediato si no mejora o empeora. Document Released: 07/11/2005 Document Revised: 12/24/2011 Exit[employer] Patient Information 2015 [employer]. This information is not intended to replace advice given to you by your health care provider. Make sure you discuss any questions you have with your health care provider.  

## 2014-05-24 ENCOUNTER — Encounter (HOSPITAL_COMMUNITY): Payer: Self-pay | Admitting: Emergency Medicine

## 2014-05-24 ENCOUNTER — Emergency Department (HOSPITAL_COMMUNITY)
Admission: EM | Admit: 2014-05-24 | Discharge: 2014-05-24 | Disposition: A | Discharge status: Home / Self Care | Payer: Self-pay | Attending: Emergency Medicine | Admitting: Emergency Medicine

## 2014-05-24 DIAGNOSIS — L02411 Cutaneous abscess of right axilla: Secondary | ICD-10-CM

## 2014-05-24 DIAGNOSIS — IMO0002 Reserved for concepts with insufficient information to code with codable children: Secondary | ICD-10-CM | POA: Insufficient documentation

## 2014-05-24 DIAGNOSIS — Z880 Allergy status to penicillin: Secondary | ICD-10-CM | POA: Insufficient documentation

## 2014-05-24 DIAGNOSIS — Z21 Asymptomatic human immunodeficiency virus [HIV] infection status: Secondary | ICD-10-CM | POA: Insufficient documentation

## 2014-05-24 DIAGNOSIS — E669 Obesity, unspecified: Secondary | ICD-10-CM | POA: Insufficient documentation

## 2014-05-24 DIAGNOSIS — I10 Essential (primary) hypertension: Secondary | ICD-10-CM | POA: Insufficient documentation

## 2014-05-24 MED ORDER — TRAMADOL-ACETAMINOPHEN 37.5-325 MG PO TABS: 1.0000 | ORAL_TABLET | Freq: Four times a day (QID) | ORAL | Status: DC | PRN

## 2014-05-24 MED ORDER — CLINDAMYCIN HCL 150 MG PO CAPS: 300.0000 mg | ORAL_CAPSULE | Freq: Three times a day (TID) | ORAL | Status: DC

## 2014-05-24 NOTE — Discharge Instructions (Signed)
Take the prescribed medication as directed.  Recommend applying warm compresses to help aid healing. Follow-up with your primary care physician. Return to the ED for new or worsening symptoms.

## 2014-05-24 NOTE — ED Notes (Signed)
Pt has abscess right axilla area pain is 10/10 and no drainage.
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
## 2014-05-26 NOTE — ED Provider Notes (Signed)
Medical screening examination/treatment/procedure(s) were performed by non-physician practitioner and as supervising physician I was immediately available for consultation/collaboration.  Aubrey Voong T Georgianne Gritz, MD 05/26/14 0901 

## 2014-08-16 ENCOUNTER — Encounter (HOSPITAL_COMMUNITY): Payer: Self-pay | Admitting: Emergency Medicine

## 2014-11-19 ENCOUNTER — Emergency Department (HOSPITAL_COMMUNITY): Payer: Self-pay

## 2014-11-19 ENCOUNTER — Emergency Department (HOSPITAL_COMMUNITY)
Admission: EM | Admit: 2014-11-19 | Discharge: 2014-11-19 | Disposition: A | Discharge status: Home / Self Care | Payer: Self-pay | Attending: Emergency Medicine | Admitting: Emergency Medicine

## 2014-11-19 ENCOUNTER — Encounter (HOSPITAL_COMMUNITY): Payer: Self-pay | Admitting: Emergency Medicine

## 2014-11-19 DIAGNOSIS — Y92410 Unspecified street and highway as the place of occurrence of the external cause: Secondary | ICD-10-CM | POA: Insufficient documentation

## 2014-11-19 DIAGNOSIS — Z792 Long term (current) use of antibiotics: Secondary | ICD-10-CM | POA: Insufficient documentation

## 2014-11-19 DIAGNOSIS — S8992XA Unspecified injury of left lower leg, initial encounter: Secondary | ICD-10-CM | POA: Insufficient documentation

## 2014-11-19 DIAGNOSIS — S59911A Unspecified injury of right forearm, initial encounter: Secondary | ICD-10-CM | POA: Insufficient documentation

## 2014-11-19 DIAGNOSIS — Z21 Asymptomatic human immunodeficiency virus [HIV] infection status: Secondary | ICD-10-CM | POA: Insufficient documentation

## 2014-11-19 DIAGNOSIS — I10 Essential (primary) hypertension: Secondary | ICD-10-CM | POA: Insufficient documentation

## 2014-11-19 DIAGNOSIS — Z880 Allergy status to penicillin: Secondary | ICD-10-CM | POA: Insufficient documentation

## 2014-11-19 DIAGNOSIS — Y9389 Activity, other specified: Secondary | ICD-10-CM | POA: Insufficient documentation

## 2014-11-19 DIAGNOSIS — Y998 Other external cause status: Secondary | ICD-10-CM | POA: Insufficient documentation

## 2014-11-19 DIAGNOSIS — M79601 Pain in right arm: Secondary | ICD-10-CM

## 2014-11-19 DIAGNOSIS — M25562 Pain in left knee: Secondary | ICD-10-CM

## 2014-11-19 MED ORDER — TRAMADOL HCL 50 MG PO TABS: 50.0000 mg | ORAL_TABLET | Freq: Four times a day (QID) | ORAL | Status: DC | PRN

## 2014-11-19 MED ORDER — OXYCODONE-ACETAMINOPHEN 5-325 MG PO TABS
2.0000 | ORAL_TABLET | Freq: Once | ORAL | Status: AC
  Administered 2014-11-19: 2 via ORAL
  Filled 2014-11-19: qty 2

## 2014-11-19 MED ORDER — HYDROMORPHONE HCL 1 MG/ML IJ SOLN
1.0000 mg | Freq: Once | INTRAMUSCULAR | Status: AC
  Administered 2014-11-19: 1 mg via INTRAVENOUS
  Filled 2014-11-19: qty 1

## 2014-11-19 MED ORDER — IBUPROFEN 400 MG PO TABS
600.0000 mg | ORAL_TABLET | Freq: Once | ORAL | Status: AC
  Administered 2014-11-19: 600 mg via ORAL
  Filled 2014-11-19 (×2): qty 1

## 2014-11-19 NOTE — ED Notes (Signed)
Called ortho.  Stated he would be here shortly.
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
## 2014-11-19 NOTE — Progress Notes (Signed)
Orthopedic Tech Progress Note Patient Details:  Alison Chambers 01/01/1986 [id number] Applied fiberglass thumb spica splint to RUE.  Pulses, sensation, motion intact before and after splinting.  Capillary refill less than 2 seconds before and after splinting. Ortho Devices Type of Ortho Device: Thumb spica splint Splint Material: Fiberglass Ortho Device/Splint Location: RUE Ortho Device/Splint Interventions: Application   Lesle ChrisGilliland, [name] 11/19/2014, 7:07 PM

## 2014-11-19 NOTE — Discharge Instructions (Signed)
If you were given medicines take as directed.  If you are on coumadin or contraceptives realize their levels and effectiveness is altered by many different medicines.  If you have any reaction (rash, tongues swelling, other) to the medicines stop taking and see a physician.   Please follow up as directed and return to the ER or see a physician for new or worsening symptoms.  Thank you. Filed Vitals:   11/19/14 1547  BP: 126/83  Pulse: 75  Temp: 97.9 F (36.6 C)  TempSrc: Oral  Resp: 22  SpO2: 98%

## 2014-12-21 ENCOUNTER — Emergency Department (HOSPITAL_COMMUNITY)
Admission: EM | Admit: 2014-12-21 | Discharge: 2014-12-21 | Disposition: A | Discharge status: Home / Self Care | Payer: Self-pay | Attending: Emergency Medicine | Admitting: Emergency Medicine

## 2014-12-21 ENCOUNTER — Encounter (HOSPITAL_COMMUNITY): Payer: Self-pay | Admitting: *Deleted

## 2014-12-21 ENCOUNTER — Emergency Department (HOSPITAL_COMMUNITY): Payer: Self-pay

## 2014-12-21 DIAGNOSIS — Z21 Asymptomatic human immunodeficiency virus [HIV] infection status: Secondary | ICD-10-CM | POA: Insufficient documentation

## 2014-12-21 DIAGNOSIS — H6691 Otitis media, unspecified, right ear: Secondary | ICD-10-CM

## 2014-12-21 DIAGNOSIS — Z87828 Personal history of other (healed) physical injury and trauma: Secondary | ICD-10-CM | POA: Insufficient documentation

## 2014-12-21 DIAGNOSIS — Z3202 Encounter for pregnancy test, result negative: Secondary | ICD-10-CM | POA: Insufficient documentation

## 2014-12-21 DIAGNOSIS — H66011 Acute suppurative otitis media with spontaneous rupture of ear drum, right ear: Secondary | ICD-10-CM | POA: Insufficient documentation

## 2014-12-21 DIAGNOSIS — H7291 Unspecified perforation of tympanic membrane, right ear: Secondary | ICD-10-CM

## 2014-12-21 DIAGNOSIS — H6091 Unspecified otitis externa, right ear: Secondary | ICD-10-CM | POA: Insufficient documentation

## 2014-12-21 DIAGNOSIS — R42 Dizziness and giddiness: Secondary | ICD-10-CM | POA: Insufficient documentation

## 2014-12-21 DIAGNOSIS — B20 Human immunodeficiency virus [HIV] disease: Secondary | ICD-10-CM

## 2014-12-21 DIAGNOSIS — Z880 Allergy status to penicillin: Secondary | ICD-10-CM | POA: Insufficient documentation

## 2014-12-21 DIAGNOSIS — I10 Essential (primary) hypertension: Secondary | ICD-10-CM | POA: Insufficient documentation

## 2014-12-21 LAB — CBC
HCT: 35.9 % — ABNORMAL LOW (ref 36.0–46.0)
Hemoglobin: 12.1 g/dL (ref 12.0–15.0)
MCH: 28.3 pg (ref 26.0–34.0)
MCHC: 33.7 g/dL (ref 30.0–36.0)
MCV: 84.1 fL (ref 78.0–100.0)
Platelets: 232 10*3/uL (ref 150–400)
RBC: 4.27 MIL/uL (ref 3.87–5.11)
RDW: 13.9 % (ref 11.5–15.5)
WBC: 3.6 10*3/uL — ABNORMAL LOW (ref 4.0–10.5)

## 2014-12-21 LAB — BASIC METABOLIC PANEL
Anion gap: 3 — ABNORMAL LOW (ref 5–15)
BUN: 14 mg/dL (ref 6–23)
CO2: 26 mmol/L (ref 19–32)
Calcium: 8.9 mg/dL (ref 8.4–10.5)
Chloride: 104 mmol/L (ref 96–112)
Creatinine, Ser: 0.68 mg/dL (ref 0.50–1.10)
GFR calc Af Amer: >90 mL/min (ref >90)
GFR calc non Af Amer: >90 mL/min (ref >90)
Glucose, Bld: 93 mg/dL (ref 70–99)
Potassium: 3.6 mmol/L (ref 3.5–5.1)
Sodium: 133 mmol/L — ABNORMAL LOW (ref 135–145)

## 2014-12-21 LAB — I-STAT TROPONIN, ED
Troponin i, poc: 0 ng/mL (ref 0.00–0.08)
Troponin i, poc: 0 ng/mL (ref 0.00–0.08)

## 2014-12-21 LAB — POC URINE PREG, ED: PREG TEST UR: NEGATIVE

## 2014-12-21 MED ORDER — CIPROFLOXACIN-DEXAMETHASONE 0.3-0.1 % OT SUSP: 4.0000 [drp] | Freq: Once | OTIC | Status: DC

## 2014-12-21 MED ORDER — AMOXICILLIN-POT CLAVULANATE 875-125 MG PO TABS: 1.0000 | ORAL_TABLET | Freq: Two times a day (BID) | ORAL | Status: DC

## 2014-12-21 MED ORDER — DEXTROSE 5 % IV SOLN
1.0000 g | Freq: Once | INTRAVENOUS | Status: AC
  Administered 2014-12-21: 1 g via INTRAVENOUS
  Filled 2014-12-21: qty 10

## 2014-12-21 MED ORDER — HYDROCODONE-ACETAMINOPHEN 5-325 MG PO TABS
1.0000 | ORAL_TABLET | Freq: Once | ORAL | Status: AC
  Administered 2014-12-21: 1 via ORAL
  Filled 2014-12-21: qty 1

## 2014-12-21 MED ORDER — NEOMYCIN-POLYMYXIN-HC 1 % OT SOLN
4.0000 [drp] | Freq: Once | OTIC | Status: AC
  Administered 2014-12-21: 4 [drp] via OTIC
  Filled 2014-12-21: qty 10

## 2014-12-21 NOTE — ED Notes (Signed)
Pt remains monitored by blood pressure, pulse ox, and 5 lead.  

## 2014-12-21 NOTE — ED Notes (Addendum)
Pt states neck pain for 4 days, headache, fever, chest pain, and states syncope yesterday at [facility].

## 2014-12-21 NOTE — ED Notes (Signed)
Ambulated pt to restroom and while walking pt became very dizzy. MD made aware.

## 2014-12-21 NOTE — ED Notes (Signed)
MD at bedside. 

## 2014-12-21 NOTE — Progress Notes (Signed)
  CARE MANAGEMENT ED NOTE 12/21/2014  Patient:  Alison Chambers, Alison Chambers   Account Number:  [id number]  Date Initiated:  12/21/2014  Documentation initiated by:  Laurena Slimmer  Subjective/Objective Assessment:   [facility] ED c/o fever, cough, headache x 4 days     Subjective/Objective Assessment Detail:     Action/Plan:   Referral for PCP  Referral for [facility] HIV program   Action/Plan Detail:   Anticipated DC Date:  12/21/2014     Status Recommendation to Physician:   Result of Recommendation:  Agreed  Other ED [facility]  CM consult  Medication Assistance    Choice offered to / List presented to:  C-1 Patient          Status of service:  Completed, signed off  ED Comments:   ED Comments Detail:  ED CM consulted by Leonette Monarch concerning f/u for HIV management program and a PCP. CM reviewed record. Patient is uninsured,  was being following at the  [facility] clinic last year.  Met with patient at bedside, introduced myself and CM role, offered assistance with follow-up care patient agreeable. Spoke with patient privately, Discussed the reconnecting with the [facility] to restart on antiviral meds, patient is agreeable. Patient voiced that  some of her family does not know about her diagnosis. As health care providers we  will not discuss her health issues with anyone that is not involved in her care, patient verbalized understandig. Offered to have scheduler from the [facility] clinic contact patient to arrange an appointment she is agreeable. Allso discussed the [facility] for her primary care needs, informed patient that she can walk-in to the clinic M- F 9a-3:30p to be seen at the [facility] to [facility] care. Patient verbalized understanding and appreciation for the assistance.  Updated Karrie Meres PA-C with dispsoition plan. No further ED CM needs identified.
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
## 2014-12-21 NOTE — Discharge Instructions (Signed)
Return to the emergency room with worsening of symptoms, new symptoms or with symptoms that are concerning, especially severe worsening of headache, visual or speech changes, weakness in face, arms or legs, persistent fevers not decreased with ibuprofen or tylenol. Please call your doctor for a followup appointment within 24-48 hours. When you talk to your doctor please let them know that you were seen in the emergency department and have them acquire all of your records so that they can discuss the findings with you and formulate a treatment plan to fully care for your new and ongoing problems. If you do not have a primary care provider please call the number below under ED resources to establish care with a provider and follow up.  Call to make appointment with ENT, ear nose and throat for follow up. Number provided above. Do not place anything in your ear other than antibiotic drops. After a shower and keep cotton ball in to prevent any fluid from entering ear. Neomycin-polymyzin B-hydrocortisone:  Instill 4 drops 3 to 4 times daily Please take all of your antibiotics until finished!   You may develop abdominal discomfort or diarrhea from the antibiotic.  You may help offset this with probiotics which you can buy or get in yogurt. Do not eat  or take the probiotics until 2 hours after your antibiotic. Follow up with the infectious disease clinic. Read below information and follow recommendations.  Otitis Media Otitis media is redness, soreness, and inflammation of the middle ear. Otitis media may be caused by allergies or, most commonly, by infection. Often it occurs as a complication of the common cold. SIGNS AND SYMPTOMS Symptoms of otitis media may include:  Earache.  Fever.  Ringing in your ear.  Headache.  Leakage of fluid from the ear. DIAGNOSIS To diagnose otitis media, your health care provider will examine your ear with an otoscope. This is an instrument that allows your health  care provider to see into your ear in order to examine your eardrum. Your health care provider also will ask you questions about your symptoms. TREATMENT  Typically, otitis media resolves on its own within 3-5 days. Your health care provider may prescribe medicine to ease your symptoms of pain. If otitis media does not resolve within 5 days or is recurrent, your health care provider may prescribe antibiotic medicines if he or she suspects that a bacterial infection is the cause. HOME CARE INSTRUCTIONS   If you were prescribed an antibiotic medicine, finish it all even if you start to feel better.  Take medicines only as directed by your health care provider.  Keep all follow-up visits as directed by your health care provider. SEEK MEDICAL CARE IF:  You have otitis media only in one ear, or bleeding from your nose, or both.  You notice a lump on your neck.  You are not getting better in 3-5 days.  You feel worse instead of better. SEEK IMMEDIATE MEDICAL CARE IF:   You have pain that is not controlled with medicine.  You have swelling, redness, or pain around your ear or stiffness in your neck.  You notice that part of your face is paralyzed.  You notice that the bone behind your ear (mastoid) is tender when you touch it. MAKE SURE YOU:   Understand these instructions.  Will watch your condition.  Will get help right away if you are not doing well or get worse. Document Released: 07/06/2004 Document Revised: 02/15/2014 Document Reviewed: 04/28/2013 ExitCare Patient Information 2015  [employer]. This information is not intended to replace advice given to you by your health care provider. Make sure you discuss any questions you have with your health care provider.

## 2014-12-21 NOTE — ED Notes (Signed)
Pt remains monitored by blood pressure, pulse ox, and 5 lead. pts family remains at bedside.  

## 2015-02-16 ENCOUNTER — Other Ambulatory Visit: Payer: Self-pay

## 2015-02-19 ENCOUNTER — Emergency Department (HOSPITAL_COMMUNITY)
Admission: EM | Admit: 2015-02-19 | Discharge: 2015-02-19 | Disposition: A | Discharge status: Home / Self Care | Payer: Self-pay | Attending: Emergency Medicine | Admitting: Emergency Medicine

## 2015-02-19 ENCOUNTER — Encounter (HOSPITAL_COMMUNITY): Payer: Self-pay | Admitting: Emergency Medicine

## 2015-02-19 ENCOUNTER — Emergency Department (HOSPITAL_COMMUNITY): Payer: Self-pay

## 2015-02-19 DIAGNOSIS — Y998 Other external cause status: Secondary | ICD-10-CM | POA: Insufficient documentation

## 2015-02-19 DIAGNOSIS — Z21 Asymptomatic human immunodeficiency virus [HIV] infection status: Secondary | ICD-10-CM | POA: Insufficient documentation

## 2015-02-19 DIAGNOSIS — Z3202 Encounter for pregnancy test, result negative: Secondary | ICD-10-CM | POA: Insufficient documentation

## 2015-02-19 DIAGNOSIS — Y9389 Activity, other specified: Secondary | ICD-10-CM | POA: Insufficient documentation

## 2015-02-19 DIAGNOSIS — S300XXA Contusion of lower back and pelvis, initial encounter: Secondary | ICD-10-CM | POA: Insufficient documentation

## 2015-02-19 DIAGNOSIS — I10 Essential (primary) hypertension: Secondary | ICD-10-CM | POA: Insufficient documentation

## 2015-02-19 DIAGNOSIS — S3991XA Unspecified injury of abdomen, initial encounter: Secondary | ICD-10-CM | POA: Insufficient documentation

## 2015-02-19 DIAGNOSIS — Z880 Allergy status to penicillin: Secondary | ICD-10-CM | POA: Insufficient documentation

## 2015-02-19 DIAGNOSIS — S199XXA Unspecified injury of neck, initial encounter: Secondary | ICD-10-CM | POA: Insufficient documentation

## 2015-02-19 DIAGNOSIS — W108XXA Fall (on) (from) other stairs and steps, initial encounter: Secondary | ICD-10-CM | POA: Insufficient documentation

## 2015-02-19 DIAGNOSIS — Y9289 Other specified places as the place of occurrence of the external cause: Secondary | ICD-10-CM | POA: Insufficient documentation

## 2015-02-19 LAB — URINALYSIS, ROUTINE W REFLEX MICROSCOPIC
BILIRUBIN URINE: NEGATIVE
GLUCOSE, UA: NEGATIVE mg/dL
HGB URINE DIPSTICK: NEGATIVE
Ketones, ur: NEGATIVE mg/dL
Leukocytes, UA: NEGATIVE
Nitrite: NEGATIVE
Protein, ur: NEGATIVE mg/dL
Specific Gravity, Urine: 1.017 (ref 1.005–1.030)
UROBILINOGEN UA: 1 mg/dL (ref 0.0–1.0)
pH: 6 (ref 5.0–8.0)

## 2015-02-19 LAB — POC URINE PREG, ED: Preg Test, Ur: NEGATIVE

## 2015-02-19 MED ORDER — IBUPROFEN 800 MG PO TABS
800.0000 mg | ORAL_TABLET | Freq: Once | ORAL | Status: AC
  Administered 2015-02-19: 800 mg via ORAL
  Filled 2015-02-19: qty 1

## 2015-02-19 MED ORDER — DIPHENHYDRAMINE HCL 25 MG PO CAPS
25.0000 mg | ORAL_CAPSULE | Freq: Once | ORAL | Status: AC
  Administered 2015-02-19: 25 mg via ORAL
  Filled 2015-02-19: qty 1

## 2015-02-19 NOTE — ED Notes (Signed)
Patient transported to X-ray 
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
## 2015-02-19 NOTE — Discharge Instructions (Signed)
Take ibuprofen or acetaminophen as needed for pain.  Contusin (Contusion) Una contusin es un hematoma profundo. Las contusiones son el resultado de una lesin que causa sangrado debajo de la piel. La zona de la contusin puede ponerse Export[place]ul, Ty Tymorada o Floralamarilla. Las lesiones menores causarn contusiones sin Engineer, miningdolor, Biomedical engineerpero las ms graves pueden presentar dolor e inflamacin durante un par de semanas.  CAUSAS  Generalmente, una contusin se debe a un golpe, un traumatismo o una fuerza directa en una zona del cuerpo. SNTOMAS   Hinchazn y enrojecimiento en la zona de la lesin.  Hematomas en la zona de la lesin.  Dolor con la palpacin y sensibilidad en la zona de la lesin.  Dolor. DIAGNSTICO  Se puede establecer el diagnstico al hacer una historia clnica y un examen fsico. Nicanor Bakeal vez sea necesario hacer una radiografa, una tomografa computarizada o una resonancia magntica para determinar si hay lesiones asociadas, como fracturas. TRATAMIENTO  El tratamiento especfico depender de la zona del cuerpo donde se produjo la lesin. En general, el mejor tratamiento para una contusin es el reposo, la aplicacin de hielo, la elevacin de la zona y la aplicacin de compresas fras en la zona de la lesin. Para calmar el dolor tambin podrn recomendarle medicamentos de venta libre. Pregntele al mdico cul es el mejor tratamiento para su contusin. INSTRUCCIONES PARA EL CUIDADO EN EL HOGAR   Aplique hielo sobre la zona lesionada.  Ponga el hielo en una bolsa plstica.  Colquese una toalla entre la piel y la bolsa de hielo.  Deje el hielo durante 15 a 20minutos, 3 a 4veces por da, o segn las indicaciones del mdico.  Utilice los medicamentos de venta libre o recetados para Primary school teachercalmar el dolor, el malestar o la [place] Hope[place]bre, segn se lo indique el mdico. El mdico podr indicarle que evite tomar antiinflamatorios (aspirina, ibuprofeno y naproxeno) durante 48 horas ya que estos medicamentos pueden  aumentar los hematomas.  Mantenga la zona de la lesin en reposo.  Si es posible, eleve la zona de la lesin para reducir la hinchazn. SOLICITE ATENCIN MDICA DE INMEDIATO SI:   El hematoma o la hinchazn aumentan.  Siente dolor que Munhallempeora.  La hinchazn o el dolor no se OGE Energyalivian con los medicamentos. ASEGRESE DE QUE:   Comprende estas instrucciones.  Controlar su afeccin.  Recibir ayuda de inmediato si no mejora o si empeora. Document Released: 07/11/2005 Document Revised: 10/06/2013 [facility] Patient Information 2015 [place], [place]. This information is not intended to replace advice given to you by your health care provider. Make sure you discuss any questions you have with your health care provider.

## 2015-03-01 ENCOUNTER — Emergency Department (HOSPITAL_COMMUNITY)
Admission: EM | Admit: 2015-03-01 | Discharge: 2015-03-01 | Discharge status: Against Medical Advice | Payer: No Typology Code available for payment source

## 2015-03-01 NOTE — ED Notes (Signed)
Pt called from lobby x two with no response.

## 2015-03-01 NOTE — ED Notes (Signed)
Pt called from lobby x one with no response 

## 2015-03-15 ENCOUNTER — Encounter (HOSPITAL_COMMUNITY): Payer: Self-pay | Admitting: Emergency Medicine

## 2015-03-15 ENCOUNTER — Emergency Department (HOSPITAL_COMMUNITY)
Admission: EM | Admit: 2015-03-15 | Discharge: 2015-03-15 | Disposition: A | Discharge status: Home / Self Care | Payer: Self-pay | Attending: Emergency Medicine | Admitting: Emergency Medicine

## 2015-03-15 DIAGNOSIS — I10 Essential (primary) hypertension: Secondary | ICD-10-CM | POA: Insufficient documentation

## 2015-03-15 DIAGNOSIS — L259 Unspecified contact dermatitis, unspecified cause: Secondary | ICD-10-CM | POA: Insufficient documentation

## 2015-03-15 DIAGNOSIS — Z21 Asymptomatic human immunodeficiency virus [HIV] infection status: Secondary | ICD-10-CM | POA: Insufficient documentation

## 2015-03-15 DIAGNOSIS — Z792 Long term (current) use of antibiotics: Secondary | ICD-10-CM | POA: Insufficient documentation

## 2015-03-15 DIAGNOSIS — Z880 Allergy status to penicillin: Secondary | ICD-10-CM | POA: Insufficient documentation

## 2015-03-15 MED ORDER — HYDROXYZINE HCL 25 MG PO TABS: 25.0000 mg | ORAL_TABLET | Freq: Four times a day (QID) | ORAL | Status: DC | PRN

## 2015-03-15 MED ORDER — PREDNISONE 10 MG PO TABS: ORAL_TABLET | ORAL | Status: DC

## 2015-03-15 NOTE — ED Notes (Signed)
Pt reports arm rash for 1 week. MD has already seen pt and given discharge papers
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
## 2015-03-15 NOTE — Discharge Instructions (Signed)
Prednisone and hydroxyzine as prescribed.  Return to the emergency department if symptoms significantly worsen or change.   Dermatitis de contacto (Contact Dermatitis) La dermatitis de contacto es una reaccin a ciertas sustancias que tocan la piel. Puede ser Ardelia Mems dermatitis de contacto irritante o alrgica. La dermatitis de contacto irritante no requiere exposicin previa a la sustancia que provoc la reaccin.La dermatitis alrgica slo ocurre si ha estado expuesto anteriormente a la sustancia. Al repetir la exposicin, el organismo reacciona a la sustancia.  CAUSAS  Muchas sustancias pueden causar dermatitis de contacto. La dermatitis irritante se produce cuando hay exposicin repetida a sustancias levemente irritantes, como por ejemplo:   Maquillaje.  Jabones.  Detergentes.  Lavandina.  cidos.  Sales metlicas, como el nquel. Las causas de la dermatitis alrgica son:   Plantas venenosas.  Sustancias qumicas (desodorantes, champs).  Bijouterie.  Ltex.  Neomicina en cremas con triple antibitico.  Conservantes en productos incluyendo en la ropa. SNTOMAS  En la zona de la piel que ha estado expuesta puede haber:   Sequedad o descamacin.  Enrojecimiento.  Grietas.  Picazn.  Dolor o sensacin de ardor.  Ampollas. En el caso de la dermatitis de Risk manager, puede haber slo hinchazn en algunas zonas, como la boca o los genitales.  DIAGNSTICO  El mdico podr hacer el diagnstico realizando un examen fsico. En los casos en que la causa es incierta y se sospecha una dermatitis de [place], le har una prueba en la piel con un parche para determinar la causa de la dermatitis. TRATAMIENTO  El tratamiento incluye la proteccin de la piel de nuevos contactos con la sustancia irritante, evitando la sustancia en lo posible. Puede ser de utilidad colocar una barrera como cremas, polvos y [place]. El mdico tambin podr recomendar:   Cremas o pomadas con  corticoides aplicadas 2 veces por da. Para un mejor efecto, humedezca la zona con agua fresca durante 20 minutos. Luego aplique el medicamento. Cubra la zona con un vendaje plstico. Puede almacenar la crema con corticoides en el refrigerador para Research scientist (medical) "refrescante" sobre la erupcin que har aliviar la picazn. Esto aliviar la picazn. En los casos ms graves ser necesario aplicar corticoides por va oral.  Ungentos con antibiticos o antibacterianos, si hay una infeccin en la piel.  Antihistamnicos en forma de locin o por va oral para calmar la picazn.  Lubricantes para mantener la humectacin de la piel.  La solucin de [place] para reducir el enrojecimiento y Conservation officer, historic buildings o para secar una erupcin que supura. Mezcle un paquete o tableta en dos tazas de agua fra. Moje un pao limpio en la solucin, escrralo un poco y colquelo en el rea afectada. Djelo en el lugar durante 30 minutos. Repita el procedimiento todas las veces que pueda a lo largo del Training and development officer.  Hgase baos con almidn o bicarbonato todos los das si la zona es demasiado extensa como para cubrirla con una toallita. Algunas sustancias qumicas, como los lcalis o los cidos pueden daar la piel del mismo modo que [place]. Enjuague la piel durante 15 a 20 minutos con agua fra despus de la exposicin a esas sustancias. Tambin busque atencin mdica de inmediato. En los casos de piel muy irritada, ser necesario aplicar (vendajes), antibiticos y analgsicos.  INSTRUCCIONES PARA EL CUIDADO EN EL HOGAR   Evite lo que ha causado la erupcin.  Mantenga el rea de la piel afectada sin contacto con el agua caliente, el jabn, la luz solar, las sustancias qumicas, sustancias  cidas o todo lo que la irrite.  No se rasque la lesin. El rascado puede hacer que la erupcin se infecte.  Puede tomar baos con agua fresca para detener la picazn.  Tome slo medicamentos de venta libre o recetados, segn las indicaciones  del mdico.  Consulting civil engineer a las visitas de control segn las indicaciones, para asegurarse de que la piel se est curando Product manager. SOLICITE ATENCIN MDICA SI:   El problema no mejora luego de 3 das de [place].  Se siente empeorar.  Observa signos de infeccin, como hinchazn, sensibilidad, inflamacin, enrojecimiento o aumenta la temperatura en la zona afectada.  Tiene nuevos problemas debido a los medicamentos. Document Released: 07/11/2005 Document Revised: 12/24/2011 [facility] Patient Information 2015 [place]. This information is not intended to replace advice given to you by your health care provider. Make sure you discuss any questions you have with your health care provider.

## 2015-03-24 ENCOUNTER — Emergency Department (HOSPITAL_COMMUNITY)
Admission: EM | Admit: 2015-03-24 | Discharge: 2015-03-24 | Discharge status: Against Medical Advice | Payer: Self-pay | Attending: Emergency Medicine | Admitting: Emergency Medicine

## 2015-03-24 ENCOUNTER — Encounter (HOSPITAL_COMMUNITY): Payer: Self-pay | Admitting: Emergency Medicine

## 2015-03-24 DIAGNOSIS — O9989 Other specified diseases and conditions complicating pregnancy, childbirth and the puerperium: Secondary | ICD-10-CM | POA: Insufficient documentation

## 2015-03-24 DIAGNOSIS — W01198A Fall on same level from slipping, tripping and stumbling with subsequent striking against other object, initial encounter: Secondary | ICD-10-CM | POA: Insufficient documentation

## 2015-03-24 DIAGNOSIS — Y9289 Other specified places as the place of occurrence of the external cause: Secondary | ICD-10-CM | POA: Insufficient documentation

## 2015-03-24 DIAGNOSIS — Z3A08 8 weeks gestation of pregnancy: Secondary | ICD-10-CM | POA: Insufficient documentation

## 2015-03-24 DIAGNOSIS — R55 Syncope and collapse: Secondary | ICD-10-CM | POA: Insufficient documentation

## 2015-03-24 DIAGNOSIS — Y9389 Activity, other specified: Secondary | ICD-10-CM | POA: Insufficient documentation

## 2015-03-24 DIAGNOSIS — Z21 Asymptomatic human immunodeficiency virus [HIV] infection status: Secondary | ICD-10-CM | POA: Insufficient documentation

## 2015-03-24 DIAGNOSIS — Y998 Other external cause status: Secondary | ICD-10-CM | POA: Insufficient documentation

## 2015-03-24 DIAGNOSIS — O9A211 Injury, poisoning and certain other consequences of external causes complicating pregnancy, first trimester: Secondary | ICD-10-CM | POA: Insufficient documentation

## 2015-03-24 DIAGNOSIS — O10011 Pre-existing essential hypertension complicating pregnancy, first trimester: Secondary | ICD-10-CM | POA: Insufficient documentation

## 2015-03-24 DIAGNOSIS — O98711 Human immunodeficiency virus [HIV] disease complicating pregnancy, first trimester: Secondary | ICD-10-CM | POA: Insufficient documentation

## 2015-03-24 NOTE — ED Notes (Signed)
Pt arrived to the ED with a complaint of a syncopal episode.  Pt is [redacted] weeks pregnant.  Pt states she felt faint and then fell hitting her head on the floor.  Pt states that she missed her last ob/gyn appointment.  Pt is refusing to have EKG done and wants to leave.

## 2015-03-28 ENCOUNTER — Encounter (HOSPITAL_COMMUNITY): Payer: Self-pay | Admitting: Emergency Medicine

## 2015-03-28 ENCOUNTER — Emergency Department (HOSPITAL_COMMUNITY)
Admission: EM | Admit: 2015-03-28 | Discharge: 2015-03-28 | Disposition: A | Discharge status: Home / Self Care | Payer: Self-pay | Attending: Emergency Medicine | Admitting: Emergency Medicine

## 2015-03-28 DIAGNOSIS — Z21 Asymptomatic human immunodeficiency virus [HIV] infection status: Secondary | ICD-10-CM | POA: Insufficient documentation

## 2015-03-28 DIAGNOSIS — I10 Essential (primary) hypertension: Secondary | ICD-10-CM | POA: Insufficient documentation

## 2015-03-28 DIAGNOSIS — Z880 Allergy status to penicillin: Secondary | ICD-10-CM | POA: Insufficient documentation

## 2015-03-28 DIAGNOSIS — B86 Scabies: Secondary | ICD-10-CM | POA: Insufficient documentation

## 2015-03-28 DIAGNOSIS — Z87828 Personal history of other (healed) physical injury and trauma: Secondary | ICD-10-CM | POA: Insufficient documentation

## 2015-03-28 MED ORDER — PERMETHRIN 5 % EX CREA: TOPICAL_CREAM | CUTANEOUS | Status: AC
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
## 2015-03-28 NOTE — Discharge Instructions (Signed)
Return to the ED with any concerns including increased area of rash, fever/chills, difficulty breathing, vomiting and not able to keep down liquids, lip or tongue swelling, difficulty breathing, decreased level of alertness/lethargy, or any other alarming symptoms

## 2015-03-28 NOTE — ED Notes (Signed)
Family at the nursing station requesting medication for itching. Discussed provider will address.

## 2015-03-28 NOTE — ED Notes (Signed)
Reported itching to Dr. Clarene Duke. MD acknowledges, no new orders at this time.

## 2015-03-28 NOTE — ED Notes (Signed)
Patient here with diffuse over body. Concentrated on left arm. Patient states that it has been present for 4 weeks and she has seen a doctor for it. Was prescribed medication without effect. Also reports that her son began to contract similar rash.

## 2015-04-21 ENCOUNTER — Encounter (HOSPITAL_COMMUNITY): Payer: Self-pay | Admitting: Emergency Medicine

## 2015-04-21 ENCOUNTER — Emergency Department (HOSPITAL_COMMUNITY)
Admission: EM | Admit: 2015-04-21 | Discharge: 2015-04-21 | Disposition: A | Discharge status: Home / Self Care | Payer: Self-pay | Attending: Emergency Medicine | Admitting: Emergency Medicine

## 2015-04-21 DIAGNOSIS — Z87828 Personal history of other (healed) physical injury and trauma: Secondary | ICD-10-CM | POA: Insufficient documentation

## 2015-04-21 DIAGNOSIS — Z21 Asymptomatic human immunodeficiency virus [HIV] infection status: Secondary | ICD-10-CM | POA: Insufficient documentation

## 2015-04-21 DIAGNOSIS — I10 Essential (primary) hypertension: Secondary | ICD-10-CM | POA: Insufficient documentation

## 2015-04-21 DIAGNOSIS — R21 Rash and other nonspecific skin eruption: Secondary | ICD-10-CM | POA: Insufficient documentation

## 2015-04-21 DIAGNOSIS — Z880 Allergy status to penicillin: Secondary | ICD-10-CM | POA: Insufficient documentation

## 2015-04-21 MED ORDER — LORATADINE 10 MG PO TABS: 10.0000 mg | ORAL_TABLET | Freq: Every day | ORAL | Status: AC

## 2015-04-21 MED ORDER — HYDROXYZINE HCL 25 MG PO TABS: 25.0000 mg | ORAL_TABLET | Freq: Three times a day (TID) | ORAL | Status: AC | PRN

## 2015-04-21 NOTE — ED Notes (Signed)
Pt states she was seen a month ago and diagnosed with scabies, given meds for itching but is having no relief to itching with benadryl. Rash is to bilateral arms.
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
## 2015-05-04 ENCOUNTER — Other Ambulatory Visit (INDEPENDENT_AMBULATORY_CARE_PROVIDER_SITE_OTHER): Payer: Self-pay

## 2015-05-04 DIAGNOSIS — Z113 Encounter for screening for infections with a predominantly sexual mode of transmission: Secondary | ICD-10-CM

## 2015-05-04 DIAGNOSIS — B20 Human immunodeficiency virus [HIV] disease: Secondary | ICD-10-CM

## 2015-05-05 LAB — RPR

## 2015-05-10 ENCOUNTER — Other Ambulatory Visit: Payer: Self-pay

## 2015-05-10 DIAGNOSIS — B20 Human immunodeficiency virus [HIV] disease: Secondary | ICD-10-CM

## 2015-05-10 LAB — CBC WITH DIFFERENTIAL/PLATELET
Basophils Absolute: 0 K/uL (ref 0.0–0.1)
Basophils Relative: 0 % (ref 0–1)
Eosinophils Absolute: 0 K/uL (ref 0.0–0.7)
Eosinophils Relative: 1 % (ref 0–5)
HCT: 36.6 % (ref 36.0–46.0)
Hemoglobin: 11.9 g/dL — ABNORMAL LOW (ref 12.0–15.0)
Lymphocytes Relative: 22 % (ref 12–46)
Lymphs Abs: 0.6 K/uL — ABNORMAL LOW (ref 0.7–4.0)
MCH: 27.9 pg (ref 26.0–34.0)
MCHC: 32.5 g/dL (ref 30.0–36.0)
MCV: 85.9 fL (ref 78.0–100.0)
MPV: 9.1 fL (ref 8.6–12.4)
Monocytes Absolute: 0.2 K/uL (ref 0.1–1.0)
Monocytes Relative: 8 % (ref 3–12)
Neutro Abs: 1.8 K/uL (ref 1.7–7.7)
Neutrophils Relative %: 69 % (ref 43–77)
Platelets: 244 K/uL (ref 150–400)
RBC: 4.26 MIL/uL (ref 3.87–5.11)
RDW: 14.8 % (ref 11.5–15.5)
WBC: 2.6 K/uL — ABNORMAL LOW (ref 4.0–10.5)

## 2015-05-10 LAB — COMPREHENSIVE METABOLIC PANEL
ALT: 19 U/L (ref 6–29)
AST: 22 U/L (ref 10–30)
Albumin: 3.3 g/dL — ABNORMAL LOW (ref 3.6–5.1)
Alkaline Phosphatase: 41 U/L (ref 33–115)
BUN: 11 mg/dL (ref 7–25)
CO2: 24 mEq/L (ref 20–31)
Calcium: 8.4 mg/dL — ABNORMAL LOW (ref 8.6–10.2)
Chloride: 102 mEq/L (ref 98–110)
Creat: 0.72 mg/dL (ref 0.50–1.10)
Glucose, Bld: 85 mg/dL (ref 65–99)
Potassium: 3.9 mEq/L (ref 3.5–5.3)
Sodium: 134 mEq/L — ABNORMAL LOW (ref 135–146)
Total Bilirubin: 0.5 mg/dL (ref 0.2–1.2)
Total Protein: 8.9 g/dL — ABNORMAL HIGH (ref 6.1–8.1)

## 2015-05-11 LAB — T-HELPER CELL (CD4) - (RCID CLINIC ONLY)
CD4 % Helper T Cell: 14 % — ABNORMAL LOW (ref 33–55)
CD4 T CELL ABS: 80 /uL — AB (ref 400–2700)

## 2015-05-12 LAB — HIV-1 RNA QUANT-NO REFLEX-BLD
HIV 1 RNA QUANT: 44146 {copies}/mL — AB (ref <20)
HIV-1 RNA Quant, Log: 4.64 {Log} — ABNORMAL HIGH (ref <1.30)

## 2015-05-25 ENCOUNTER — Ambulatory Visit: Payer: Self-pay | Admitting: Internal Medicine

## 2015-05-29 ENCOUNTER — Emergency Department (HOSPITAL_BASED_OUTPATIENT_CLINIC_OR_DEPARTMENT_OTHER)
Admission: EM | Admit: 2015-05-29 | Discharge: 2015-05-29 | Disposition: A | Discharge status: Home / Self Care | Payer: Self-pay | Attending: Emergency Medicine | Admitting: Emergency Medicine

## 2015-05-29 ENCOUNTER — Encounter (HOSPITAL_BASED_OUTPATIENT_CLINIC_OR_DEPARTMENT_OTHER): Payer: Self-pay | Admitting: *Deleted

## 2015-05-29 DIAGNOSIS — B20 Human immunodeficiency virus [HIV] disease: Secondary | ICD-10-CM | POA: Insufficient documentation

## 2015-05-29 DIAGNOSIS — B379 Candidiasis, unspecified: Secondary | ICD-10-CM

## 2015-05-29 DIAGNOSIS — Z79899 Other long term (current) drug therapy: Secondary | ICD-10-CM | POA: Insufficient documentation

## 2015-05-29 DIAGNOSIS — L309 Dermatitis, unspecified: Secondary | ICD-10-CM | POA: Insufficient documentation

## 2015-05-29 DIAGNOSIS — Z87828 Personal history of other (healed) physical injury and trauma: Secondary | ICD-10-CM | POA: Insufficient documentation

## 2015-05-29 DIAGNOSIS — Z880 Allergy status to penicillin: Secondary | ICD-10-CM | POA: Insufficient documentation

## 2015-05-29 DIAGNOSIS — B372 Candidiasis of skin and nail: Secondary | ICD-10-CM | POA: Insufficient documentation

## 2015-05-29 DIAGNOSIS — I10 Essential (primary) hypertension: Secondary | ICD-10-CM | POA: Insufficient documentation

## 2015-05-29 MED ORDER — CLOTRIMAZOLE 1 % EX CREA: TOPICAL_CREAM | CUTANEOUS | Status: AC

## 2015-05-29 MED ORDER — HYDROXYZINE HCL 25 MG PO TABS
25.0000 mg | ORAL_TABLET | Freq: Three times a day (TID) | ORAL | Status: AC | PRN
Start: 2015-05-29 — End: ?

## 2015-05-29 MED ORDER — HYDROXYZINE HCL 25 MG PO TABS
25.0000 mg | ORAL_TABLET | Freq: Once | ORAL | Status: AC
  Administered 2015-05-29: 25 mg via ORAL
  Filled 2015-05-29: qty 1

## 2015-05-29 NOTE — ED Notes (Signed)
Pt states that she stopped taking her HIV meds when her father died one month ago

## 2015-05-29 NOTE — ED Notes (Signed)
Pt here for generalized rash x 1 month which is itching and she reports foul smelling drainage from navel.  Pt denies any other members of the household having the same.  Pt has taken benadryl at home without relief
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
## 2015-06-07 ENCOUNTER — Emergency Department (HOSPITAL_COMMUNITY)
Admission: EM | Admit: 2015-06-07 | Discharge: 2015-06-07 | Disposition: A | Discharge status: Home / Self Care | Payer: Self-pay | Attending: Emergency Medicine | Admitting: Emergency Medicine

## 2015-06-07 DIAGNOSIS — Z792 Long term (current) use of antibiotics: Secondary | ICD-10-CM | POA: Insufficient documentation

## 2015-06-07 DIAGNOSIS — Z21 Asymptomatic human immunodeficiency virus [HIV] infection status: Secondary | ICD-10-CM | POA: Insufficient documentation

## 2015-06-07 DIAGNOSIS — R21 Rash and other nonspecific skin eruption: Secondary | ICD-10-CM | POA: Insufficient documentation

## 2015-06-07 DIAGNOSIS — J029 Acute pharyngitis, unspecified: Secondary | ICD-10-CM | POA: Insufficient documentation

## 2015-06-07 DIAGNOSIS — I10 Essential (primary) hypertension: Secondary | ICD-10-CM | POA: Insufficient documentation

## 2015-06-07 DIAGNOSIS — Z880 Allergy status to penicillin: Secondary | ICD-10-CM | POA: Insufficient documentation

## 2015-06-07 DIAGNOSIS — Z87828 Personal history of other (healed) physical injury and trauma: Secondary | ICD-10-CM | POA: Insufficient documentation

## 2015-06-07 DIAGNOSIS — R51 Headache: Secondary | ICD-10-CM | POA: Insufficient documentation

## 2015-06-07 DIAGNOSIS — H6092 Unspecified otitis externa, left ear: Secondary | ICD-10-CM | POA: Insufficient documentation

## 2015-06-07 MED ORDER — NEOMYCIN-POLYMYXIN-HC 3.5-10000-1 OT SUSP: 4.0000 [drp] | Freq: Three times a day (TID) | OTIC | Status: AC

## 2015-06-07 MED ORDER — ACETAMINOPHEN 325 MG PO TABS
650.0000 mg | ORAL_TABLET | Freq: Once | ORAL | Status: AC
  Administered 2015-06-07: 650 mg via ORAL
  Filled 2015-06-07: qty 2

## 2015-06-07 MED ORDER — CEFDINIR 300 MG PO CAPS: 300.0000 mg | ORAL_CAPSULE | Freq: Two times a day (BID) | ORAL | Status: DC

## 2015-06-07 MED ORDER — AMOXICILLIN 500 MG PO CAPS: 500.0000 mg | ORAL_CAPSULE | Freq: Three times a day (TID) | ORAL | Status: DC

## 2015-06-07 MED ORDER — LIDOCAINE VISCOUS 2 % MT SOLN: 20.0000 mL | OROMUCOSAL | Status: AC | PRN
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
## 2015-06-07 NOTE — Discharge Instructions (Signed)
Please take prescriptions for Lidocaine mouthwash, Omnicef and Cortisporin drops as prescribed. Please follow up with you primary care provider in 2-3 days.  Please return to the Emergency Department if symptoms worsen.

## 2015-06-07 NOTE — ED Notes (Signed)
Patient c/o bilateral ear pain L>R, sore throat, and rash on bilateral arms x 4 days.

## 2015-08-19 ENCOUNTER — Encounter (HOSPITAL_BASED_OUTPATIENT_CLINIC_OR_DEPARTMENT_OTHER): Payer: Self-pay

## 2015-08-19 ENCOUNTER — Emergency Department (HOSPITAL_BASED_OUTPATIENT_CLINIC_OR_DEPARTMENT_OTHER): Payer: Self-pay

## 2015-08-19 ENCOUNTER — Emergency Department (HOSPITAL_BASED_OUTPATIENT_CLINIC_OR_DEPARTMENT_OTHER)
Admission: EM | Admit: 2015-08-19 | Discharge: 2015-08-19 | Disposition: A | Discharge status: Home / Self Care | Payer: Self-pay | Attending: Emergency Medicine | Admitting: Emergency Medicine

## 2015-08-19 ENCOUNTER — Telehealth: Payer: Self-pay | Admitting: *Deleted

## 2015-08-19 DIAGNOSIS — Z21 Asymptomatic human immunodeficiency virus [HIV] infection status: Secondary | ICD-10-CM | POA: Insufficient documentation

## 2015-08-19 DIAGNOSIS — Z880 Allergy status to penicillin: Secondary | ICD-10-CM | POA: Insufficient documentation

## 2015-08-19 DIAGNOSIS — K529 Noninfective gastroenteritis and colitis, unspecified: Secondary | ICD-10-CM

## 2015-08-19 DIAGNOSIS — R197 Diarrhea, unspecified: Secondary | ICD-10-CM

## 2015-08-19 DIAGNOSIS — Z9851 Tubal ligation status: Secondary | ICD-10-CM | POA: Insufficient documentation

## 2015-08-19 DIAGNOSIS — Z79899 Other long term (current) drug therapy: Secondary | ICD-10-CM | POA: Insufficient documentation

## 2015-08-19 DIAGNOSIS — I10 Essential (primary) hypertension: Secondary | ICD-10-CM | POA: Insufficient documentation

## 2015-08-19 DIAGNOSIS — Z9889 Other specified postprocedural states: Secondary | ICD-10-CM | POA: Insufficient documentation

## 2015-08-19 DIAGNOSIS — Z3202 Encounter for pregnancy test, result negative: Secondary | ICD-10-CM | POA: Insufficient documentation

## 2015-08-19 DIAGNOSIS — Z87828 Personal history of other (healed) physical injury and trauma: Secondary | ICD-10-CM | POA: Insufficient documentation

## 2015-08-19 LAB — CBC
HEMATOCRIT: 37.3 % (ref 36.0–46.0)
Hemoglobin: 12.6 g/dL (ref 12.0–15.0)
MCH: 28.2 pg (ref 26.0–34.0)
MCHC: 33.8 g/dL (ref 30.0–36.0)
MCV: 83.4 fL (ref 78.0–100.0)
PLATELETS: 233 10*3/uL (ref 150–400)
RBC: 4.47 MIL/uL (ref 3.87–5.11)
RDW: 13.5 % (ref 11.5–15.5)
WBC: 3 10*3/uL — AB (ref 4.0–10.5)

## 2015-08-19 LAB — COMPREHENSIVE METABOLIC PANEL
ALT: 24 U/L (ref 14–54)
AST: 29 U/L (ref 15–41)
Albumin: 3.6 g/dL (ref 3.5–5.0)
Alkaline Phosphatase: 45 U/L (ref 38–126)
Anion gap: 6 (ref 5–15)
BUN: 13 mg/dL (ref 6–20)
CHLORIDE: 105 mmol/L (ref 101–111)
CO2: 21 mmol/L — ABNORMAL LOW (ref 22–32)
CREATININE: 0.68 mg/dL (ref 0.44–1.00)
Calcium: 8.9 mg/dL (ref 8.9–10.3)
Glucose, Bld: 108 mg/dL — ABNORMAL HIGH (ref 65–99)
POTASSIUM: 3.6 mmol/L (ref 3.5–5.1)
Sodium: 132 mmol/L — ABNORMAL LOW (ref 135–145)
Total Bilirubin: 0.4 mg/dL (ref 0.3–1.2)
Total Protein: 10.5 g/dL — ABNORMAL HIGH (ref 6.5–8.1)

## 2015-08-19 LAB — C DIFFICILE QUICK SCREEN W PCR REFLEX
C Diff antigen: NEGATIVE
C Diff interpretation: NEGATIVE
C Diff toxin: NEGATIVE

## 2015-08-19 LAB — URINALYSIS, ROUTINE W REFLEX MICROSCOPIC
Bilirubin Urine: NEGATIVE
Glucose, UA: NEGATIVE mg/dL
Hgb urine dipstick: NEGATIVE
KETONES UR: NEGATIVE mg/dL
LEUKOCYTES UA: NEGATIVE
Nitrite: NEGATIVE
PROTEIN: 100 mg/dL — AB
Specific Gravity, Urine: 1.025 (ref 1.005–1.030)
UROBILINOGEN UA: 0.2 mg/dL (ref 0.0–1.0)
pH: 6 (ref 5.0–8.0)

## 2015-08-19 LAB — PREGNANCY, URINE: PREG TEST UR: NEGATIVE

## 2015-08-19 LAB — URINE MICROSCOPIC-ADD ON

## 2015-08-19 LAB — T-HELPER CELLS (CD4) COUNT (NOT AT ARMC)
CD4 % Helper T Cell: 12 % — ABNORMAL LOW (ref 33–55)
CD4 T CELL ABS: 90 /uL — AB (ref 400–2700)

## 2015-08-19 LAB — I-STAT CG4 LACTIC ACID, ED: Lactic Acid, Venous: 1.84 mmol/L (ref 0.5–2.0)

## 2015-08-19 LAB — LIPASE, BLOOD: LIPASE: 30 U/L (ref 11–51)

## 2015-08-19 MED ORDER — ONDANSETRON HCL 4 MG/2ML IJ SOLN
4.0000 mg | Freq: Once | INTRAMUSCULAR | Status: AC
  Administered 2015-08-19: 4 mg via INTRAVENOUS
  Filled 2015-08-19: qty 2

## 2015-08-19 MED ORDER — SODIUM CHLORIDE 0.9 % IV BOLUS (SEPSIS)
1000.0000 mL | Freq: Once | INTRAVENOUS | Status: AC
  Administered 2015-08-19: 1000 mL via INTRAVENOUS

## 2015-08-19 MED ORDER — RANITIDINE HCL 150 MG/10ML PO SYRP
150.0000 mg | ORAL_SOLUTION | Freq: Once | ORAL | Status: AC
  Administered 2015-08-19: 150 mg via ORAL
  Filled 2015-08-19: qty 10

## 2015-08-19 MED ORDER — MORPHINE SULFATE (PF) 4 MG/ML IV SOLN
4.0000 mg | Freq: Once | INTRAVENOUS | Status: AC
  Administered 2015-08-19: 4 mg via INTRAVENOUS
  Filled 2015-08-19: qty 1

## 2015-08-19 MED ORDER — METHYLPREDNISOLONE SODIUM SUCC 125 MG IJ SOLR
125.0000 mg | Freq: Once | INTRAMUSCULAR | Status: AC
  Administered 2015-08-19: 125 mg via INTRAVENOUS
  Filled 2015-08-19: qty 2

## 2015-08-19 MED ORDER — ONDANSETRON 4 MG PO TBDP: 4.0000 mg | ORAL_TABLET | Freq: Three times a day (TID) | ORAL | Status: DC | PRN

## 2015-08-19 MED ORDER — IOHEXOL 300 MG/ML  SOLN
100.0000 mL | Freq: Once | INTRAMUSCULAR | Status: AC | PRN
  Administered 2015-08-19: 100 mL via INTRAVENOUS

## 2015-08-19 MED ORDER — IOHEXOL 300 MG/ML  SOLN
25.0000 mL | Freq: Once | INTRAMUSCULAR | Status: AC | PRN
  Administered 2015-08-19: 25 mL via ORAL

## 2015-08-19 MED ORDER — DIPHENHYDRAMINE HCL 50 MG/ML IJ SOLN
INTRAMUSCULAR | Status: AC
  Filled 2015-08-19: qty 1

## 2015-08-19 MED ORDER — DIPHENHYDRAMINE HCL 50 MG/ML IJ SOLN
25.0000 mg | Freq: Once | INTRAMUSCULAR | Status: AC
  Administered 2015-08-19: 25 mg via INTRAVENOUS

## 2015-08-19 MED ORDER — HYDROMORPHONE HCL 1 MG/ML IJ SOLN
1.0000 mg | Freq: Once | INTRAMUSCULAR | Status: AC
  Administered 2015-08-19: 1 mg via INTRAVENOUS
  Filled 2015-08-19: qty 1

## 2015-08-19 MED ORDER — DICYCLOMINE HCL 20 MG PO TABS: 20.0000 mg | ORAL_TABLET | Freq: Two times a day (BID) | ORAL | Status: DC
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
## 2015-08-19 NOTE — ED Notes (Signed)
Patient is stable and ambulatory.  Patient verbalizes understanding of discharge instructions and medications. 

## 2015-08-19 NOTE — ED Notes (Signed)
MD at bedside. 

## 2015-08-19 NOTE — ED Notes (Signed)
Dr Sundra AlandSchlossmon aware of BP order given.

## 2015-08-19 NOTE — ED Notes (Signed)
MD back at bedside for reassessment.

## 2015-08-19 NOTE — Telephone Encounter (Signed)
Received call from ED MD at [facility].  Patient is there with diarrhea, ED MD would like to set up an appointment for her to reestablish care and would like to speak with Dr Ninetta LightsHatcher - pager given. Patient set up for nurse visit (vital sign check per ED request) and patient assistance to see the status of her ADAP on Monday 11/7.  She is scheduled for follow up with Dr. Ninetta LightsHatcher on 11/28.  [facility] know she may need case management if she comes on Monday.  Will [facility] as well. Andree CossHowell, Michelle M, RN

## 2015-08-20 LAB — URINE CULTURE

## 2015-08-22 ENCOUNTER — Ambulatory Visit: Payer: Self-pay

## 2015-08-22 ENCOUNTER — Ambulatory Visit: Payer: Self-pay | Admitting: *Deleted

## 2015-08-22 DIAGNOSIS — B20 Human immunodeficiency virus [HIV] disease: Secondary | ICD-10-CM

## 2015-08-22 NOTE — Telephone Encounter (Signed)
Thank you so much Marcelino DusterMichelle, I will be sure to follow up on this this week

## 2015-08-22 NOTE — Patient Instructions (Signed)
Please refer to progress note for further details

## 2015-08-22 NOTE — Progress Notes (Signed)
Patient ID: Alison Chambers, female   DOB: 11/11/1985, 29 y.o.   MRN: [id number] RN attempted to meet the patient at the clinic during her ADAP and Nursing appt. Pt did not show for her appt at 9:15am. Plan at this time is to see if the patient is interested in re-engaging in care and complying with her medication regimen before moving forward.

## 2015-08-22 NOTE — Telephone Encounter (Signed)
Thank you :)

## 2015-09-12 ENCOUNTER — Ambulatory Visit: Payer: Self-pay | Admitting: Infectious Diseases

## 2015-10-21 ENCOUNTER — Other Ambulatory Visit (INDEPENDENT_AMBULATORY_CARE_PROVIDER_SITE_OTHER): Payer: Self-pay

## 2015-10-21 ENCOUNTER — Emergency Department (HOSPITAL_BASED_OUTPATIENT_CLINIC_OR_DEPARTMENT_OTHER)
Admission: EM | Admit: 2015-10-21 | Discharge: 2015-10-21 | Disposition: A | Discharge status: Home / Self Care | Payer: Self-pay | Attending: Emergency Medicine | Admitting: Emergency Medicine

## 2015-10-21 ENCOUNTER — Emergency Department (HOSPITAL_BASED_OUTPATIENT_CLINIC_OR_DEPARTMENT_OTHER): Payer: Self-pay

## 2015-10-21 ENCOUNTER — Other Ambulatory Visit: Payer: Self-pay | Admitting: *Deleted

## 2015-10-21 ENCOUNTER — Encounter (HOSPITAL_BASED_OUTPATIENT_CLINIC_OR_DEPARTMENT_OTHER): Payer: Self-pay

## 2015-10-21 DIAGNOSIS — I10 Essential (primary) hypertension: Secondary | ICD-10-CM | POA: Insufficient documentation

## 2015-10-21 DIAGNOSIS — Z880 Allergy status to penicillin: Secondary | ICD-10-CM | POA: Insufficient documentation

## 2015-10-21 DIAGNOSIS — Z21 Asymptomatic human immunodeficiency virus [HIV] infection status: Secondary | ICD-10-CM | POA: Insufficient documentation

## 2015-10-21 DIAGNOSIS — E119 Type 2 diabetes mellitus without complications: Secondary | ICD-10-CM | POA: Insufficient documentation

## 2015-10-21 DIAGNOSIS — Z113 Encounter for screening for infections with a predominantly sexual mode of transmission: Secondary | ICD-10-CM

## 2015-10-21 DIAGNOSIS — Z79899 Other long term (current) drug therapy: Secondary | ICD-10-CM | POA: Insufficient documentation

## 2015-10-21 DIAGNOSIS — F418 Other specified anxiety disorders: Secondary | ICD-10-CM

## 2015-10-21 DIAGNOSIS — B20 Human immunodeficiency virus [HIV] disease: Secondary | ICD-10-CM

## 2015-10-21 DIAGNOSIS — R0789 Other chest pain: Secondary | ICD-10-CM | POA: Insufficient documentation

## 2015-10-21 DIAGNOSIS — F419 Anxiety disorder, unspecified: Secondary | ICD-10-CM | POA: Insufficient documentation

## 2015-10-21 DIAGNOSIS — Z792 Long term (current) use of antibiotics: Secondary | ICD-10-CM | POA: Insufficient documentation

## 2015-10-21 HISTORY — DX: Type 2 diabetes mellitus without complications: E11.9

## 2015-10-21 LAB — CBC WITH DIFFERENTIAL/PLATELET
BASOS ABS: 0 10*3/uL (ref 0.0–0.1)
BASOS ABS: 0 10*3/uL (ref 0.0–0.1)
BASOS PCT: 0 % (ref 0–1)
BASOS PCT: 0 %
Eosinophils Absolute: 0 10*3/uL (ref 0.0–0.7)
Eosinophils Absolute: 0 10*3/uL (ref 0.0–0.7)
Eosinophils Relative: 0 % (ref 0–5)
Eosinophils Relative: 0 %
HCT: 34.9 % — ABNORMAL LOW (ref 36.0–46.0)
HEMATOCRIT: 36 % (ref 36.0–46.0)
HEMOGLOBIN: 12.2 g/dL (ref 12.0–15.0)
HEMOGLOBIN: 11.9 g/dL — AB (ref 12.0–15.0)
LYMPHS PCT: 29 % (ref 12–46)
Lymphocytes Relative: 25 %
Lymphs Abs: 0.8 10*3/uL (ref 0.7–4.0)
Lymphs Abs: 0.7 10*3/uL (ref 0.7–4.0)
MCH: 28.9 pg (ref 26.0–34.0)
MCH: 27.9 pg (ref 26.0–34.0)
MCHC: 35 g/dL (ref 30.0–36.0)
MCHC: 33.1 g/dL (ref 30.0–36.0)
MCV: 82.7 fL (ref 78.0–100.0)
MCV: 84.3 fL (ref 78.0–100.0)
MONO ABS: 0.2 10*3/uL (ref 0.1–1.0)
MPV: 9.9 fL (ref 8.6–12.4)
Monocytes Absolute: 0.3 10*3/uL (ref 0.1–1.0)
Monocytes Relative: 8 % (ref 3–12)
Monocytes Relative: 10 %
NEUTROS ABS: 1.8 10*3/uL (ref 1.7–7.7)
NEUTROS ABS: 1.9 10*3/uL (ref 1.7–7.7)
NEUTROS PCT: 63 % (ref 43–77)
NEUTROS PCT: 65 %
Platelets: 276 10*3/uL (ref 150–400)
Platelets: 241 10*3/uL (ref 150–400)
RBC: 4.22 MIL/uL (ref 3.87–5.11)
RBC: 4.27 MIL/uL (ref 3.87–5.11)
RDW: 15.4 % (ref 11.5–15.5)
RDW: 14.8 % (ref 11.5–15.5)
WBC: 2.8 10*3/uL — AB (ref 4.0–10.5)
WBC: 2.9 10*3/uL — ABNORMAL LOW (ref 4.0–10.5)

## 2015-10-21 LAB — LIPID PANEL
CHOL/HDL RATIO: 3.3 ratio (ref <=5.0)
Cholesterol: 158 mg/dL (ref 125–200)
HDL: 48 mg/dL (ref >=46)
LDL CALC: 99 mg/dL (ref <130)
Triglycerides: 56 mg/dL (ref <150)
VLDL: 11 mg/dL (ref <30)

## 2015-10-21 LAB — BASIC METABOLIC PANEL
ANION GAP: 5 (ref 5–15)
BUN: 6 mg/dL (ref 6–20)
CHLORIDE: 106 mmol/L (ref 101–111)
CO2: 23 mmol/L (ref 22–32)
Calcium: 8.9 mg/dL (ref 8.9–10.3)
Creatinine, Ser: 0.73 mg/dL (ref 0.44–1.00)
GFR calc non Af Amer: >60 mL/min (ref >60)
Glucose, Bld: 107 mg/dL — ABNORMAL HIGH (ref 65–99)
Potassium: 3.4 mmol/L — ABNORMAL LOW (ref 3.5–5.1)
Sodium: 134 mmol/L — ABNORMAL LOW (ref 135–145)

## 2015-10-21 LAB — COMPLETE METABOLIC PANEL WITH GFR
ALBUMIN: 3.8 g/dL (ref 3.6–5.1)
ALK PHOS: 42 U/L (ref 33–115)
ALT: 21 U/L (ref 6–29)
AST: 23 U/L (ref 10–30)
BUN: 5 mg/dL — AB (ref 7–25)
CHLORIDE: 101 mmol/L (ref 98–110)
CO2: 25 mmol/L (ref 20–31)
CREATININE: 0.76 mg/dL (ref 0.50–1.10)
Calcium: 9.2 mg/dL (ref 8.6–10.2)
GFR, Est African American: >89 mL/min (ref >=60)
GFR, Est Non African American: >89 mL/min (ref >=60)
GLUCOSE: 104 mg/dL — AB (ref 65–99)
POTASSIUM: 3.4 mmol/L — AB (ref 3.5–5.3)
SODIUM: 136 mmol/L (ref 135–146)
Total Bilirubin: 0.5 mg/dL (ref 0.2–1.2)
Total Protein: 9.9 g/dL — ABNORMAL HIGH (ref 6.1–8.1)

## 2015-10-21 LAB — TROPONIN I: Troponin I: <0.03 ng/mL (ref <0.031)

## 2015-10-21 LAB — T-HELPER CELL (CD4) - (RCID CLINIC ONLY)
CD4 T CELL HELPER: 11 % — AB (ref 33–55)
CD4 T Cell Abs: 90 /uL — ABNORMAL LOW (ref 400–2700)

## 2015-10-21 LAB — CBG MONITORING, ED: Glucose-Capillary: 99 mg/dL (ref 65–99)

## 2015-10-21 NOTE — ED Notes (Signed)
Pt states "He (indicating her female companion) doesn't know..." this rn asks pt female companion to step out of the room, pt starts crying out "No! I don't want him to leave me!" pt refuses to allow her companion to leave the room. Explained that I have to talk to her about this, pt states "OK, he doesn't speak english anyway."

## 2015-10-21 NOTE — Discharge Instructions (Signed)
Dolor en la pared torcica (Chest Wall Pain) El dolor en la pared torcica se produce en los huesos y los msculos del pecho o alrededor de Scientist, product/process developmentestos. A veces, una lesin Occupational psychologistcausa este dolor. En ocasiones, la causa puede ser desconocida. Este dolor puede durar varias semanas. CUIDADOS EN EL HOGAR Est atento a cualquier cambio en los sntomas. Tome estas medidas para Acupuncturistaliviar el dolor: Sports administratorHaga reposo tal como le indic el mdico. Evite las actividades que causan dolor. Intente no usar los Km 47-7msculos del trax, los del vientre (abdominales) o los laterales para levantar objetos pesados. Si se lo indican, aplique hielo sobre la zona dolorida: Ponga el hielo en una bolsa plstica. Coloque una toalla entre la piel y la bolsa de hielo. Coloque el hielo durante 20minutos, 2 a 3veces por Futures traderda. Tome los medicamentos de venta libre y los recetados solamente como se lo haya indicado el mdico. No consuma productos que contengan tabaco, incluidos cigarrillos, tabaco de Theatre managermascar y Administrator, Civil Servicecigarrillos electrnicos. Si necesita ayuda para dejar de fumar, consulte al mdico. Concurra a todas las visitas de control como se lo haya indicado el mdico. Esto es importante. SOLICITE AYUDA SI: Tiene fiebre. El dolor en el pecho Peaseempeora. Aparecen nuevos sntomas. SOLICITE AYUDA DE INMEDIATO SI: Tiene malestar estomacal (nuseas) o vomita. Berenice Primasranspira o tiene sensacin de desvanecimiento. Tiene tos con flema (esputo) o expectora sangre al toser. Comienza a sentir falta de aire.   Esta informacin no tiene Theme park managercomo fin reemplazar el consejo del mdico. Asegrese de hacerle al mdico cualquier pregunta que tenga.   Document Released: 09/20/2011 Document Revised: 06/22/2015 Elsevier Interactive Patient Education 2016 [employer] Trastorno de ansiedad generalizada (Generalized Anxiety Disorder) El trastorno de ansiedad generalizada es un trastorno mental. Interfiere en las funciones vitales, incluyendo las Newbernrelaciones, el trabajo y la  escuela.  Es diferente de la ansiedad normal que todas las personas experimentan en algn momento de su vida en respuesta a sucesos y Chief Operating Officeractividades especficas. En verdad, la ansiedad normal nos ayuda a prepararnos y Human resources officeratravesar estos acontecimientos y actividades de la vida. La ansiedad normal desaparece despus de que el evento o la actividad ha finalizado.  El trastorno de ansiedad generalizada no est necesariamente relacionada con eventos o actividades especficas. Tambin causa un exceso de ansiedad en proporcin a sucesos o actividades especficas. En este trastorno la ansiedad es difcil de Chief Operating Officercontrolar. Los sntomas pueden variar de leves a muy graves. Las personas que sufren de trastorno de ansiedad generalizada pueden tener intensas olas de ansiedad con sntomas fsicos (ataques de pnico).  SNTOMAS  La ansiedad y la preocupacin asociada a este trastorno son difciles de Chief Operating Officercontrolar. Esta ansiedad y la preocupacin estn relacionados con muchos eventos de la vida y sus actividades y tambin ocurre durante ms [employer] de los que no ocurre, durante 6 meses o ms. Las personas que la sufren pueden tener tres o ms de los siguientes sntomas (uno o ms en los nios):  Armed forces operational officerAgitacin  Fatiga. Dificultades de concentracin.  Irritabilidad. Tensin muscular Dificultad para dormirse o sueo poco satisfactorio. DIAGNSTICO  Se diagnostica a travs de una evaluacin realizada por el mdico. El mdico le har preguntas acerca de su estado de nimo, sntomas fsicos y sucesos de Oregonsu vida. Le har preguntas sobre su historia clnica, el consumo de alcohol o drogas, incluyendo los medicamentos recetados. Nucor Corporationambin le har un examen fsico e indicar anlisis de Seamansangre. Ciertas enfermedades y el uso de determinadas sustancias pueden causar sntomas similares a este trastorno. Su mdico lo puede derivar a un  especialista en salud mental para una evaluacin ms profunda.Gerlean Ren  Las terapias siguientes se utilizan en  el tratamiento de este trastorno:  Medicamentos - Se recetan antidepresivos para el control diario a Air cabin crew. Pueden indicarse tambin medicamentos para combatir la [employer] graves, especialmente cuando ocurren ataques de pnico.  Terapia conversada (psicoterapia) Ciertos tipos de psicoterapia pueden ser tiles en el tratamiento del trastorno de ansiedad generalizada, proporcionando apoyo, educacin y Optometrist. Una forma de psicoterapia llamada terapia cognitivo-conductual puede ensearle formas saludables de pensar y Publishing rights manager a los eventos y actividades de la vida diaria. Tcnicasde manejo del estrs- Estas tcnicas incluyen el yoga, la meditacin y el ejercicio y pueden ser muy tiles cuando se practican con regularidad. Un especialista en salud mental puede ayudar a determinar qu tratamiento es mejor para usted. Algunas personas obtienen mejora con una terapia. Sin embargo, Economist requieren una combinacin de terapias.    Esta informacin no tiene Theme park manager el consejo del mdico. Asegrese de hacerle al mdico cualquier pregunta que tenga.   Document Released: 01/26/2013 Document Revised: 10/22/2014 Elsevier Interactive Patient Education 2016 Elsevier Inc. Generalized Anxiety Disorder Generalized anxiety disorder (GAD) is a mental disorder. It interferes with life functions, including relationships, work, and school. GAD is different from normal anxiety, which everyone experiences at some point in their lives in response to specific life events and activities. Normal anxiety actually helps [place] prepare for and get through these life events and activities. Normal anxiety goes away after the event or activity is over.  GAD causes anxiety that is not necessarily related to specific events or activities. It also causes excess anxiety in proportion to specific events or activities. The anxiety associated with GAD is also difficult to control. GAD can vary from  mild to severe. People with severe GAD can have intense waves of anxiety with physical symptoms (panic attacks).  SYMPTOMS The anxiety and worry associated with GAD are difficult to control. This anxiety and worry are related to many life events and activities and also occur more days than not for 6 months or longer. People with GAD also have three or more of the following symptoms (one or more in children):  Restlessness.   Fatigue.  Difficulty concentrating.   Irritability.  Muscle tension.  Difficulty sleeping or unsatisfying sleep. DIAGNOSIS GAD is diagnosed through an assessment by your health care provider. Your health care provider will ask you questions aboutyour mood,physical symptoms, and events in your life. Your health care provider may ask you about your medical history and use of alcohol or drugs, including prescription medicines. Your health care provider may also do a physical exam and blood tests. Certain medical conditions and the use of certain substances can cause symptoms similar to those associated with GAD. Your health care provider may refer you to a mental health specialist for further evaluation. TREATMENT The following therapies are usually used to treat GAD:   Medication. Antidepressant medication usually is prescribed for long-term daily control. Antianxiety medicines may be added in severe cases, especially when panic attacks occur.   Talk therapy (psychotherapy). Certain types of talk therapy can be helpful in treating GAD by providing support, education, and guidance. A form of talk therapy called cognitive behavioral therapy can teach you healthy ways to think about and react to daily life events and activities.  Stress managementtechniques. These include yoga, meditation, and exercise and can be very helpful when they are practiced regularly. A mental health specialist can help  determine which treatment is best for you. Some people see improvement with  one therapy. However, other people require a combination of therapies.   This information is not intended to replace advice given to you by your health care provider. Make sure you discuss any questions you have with your health care provider.   Document Released: 01/26/2013 Document Revised: 10/22/2014 Document Reviewed: 01/26/2013 Elsevier Interactive Patient Education 2016 Elsevier Inc. Chest Wall Pain Chest wall pain is pain in or around the bones and muscles of your chest. Sometimes, an injury causes this pain. Sometimes, the cause may not be known. This pain may take several weeks or longer to get better. HOME CARE Pay attention to any changes in your symptoms. Take these actions to help with your pain:  Rest as told by your doctor.  Avoid activities that cause pain. Try not to use your chest, belly (abdominal), or side muscles to lift heavy things.  If directed, apply ice to the painful area:  Put ice in a plastic bag.  Place a towel between your skin and the bag.  Leave the ice on for 20 minutes, 2-3 times per day.  Take over-the-counter and prescription medicines only as told by your doctor.  Do not use tobacco products, including cigarettes, chewing tobacco, and e-cigarettes. If you need help quitting, ask your doctor.  Keep all follow-up visits as told by your doctor. This is important. GET HELP IF:  You have a fever.  Your chest pain gets worse.  You have new symptoms. GET HELP RIGHT AWAY IF:  You feel sick to your stomach (nauseous) or you throw up (vomit).  You feel sweaty or light-headed.  You have a cough with phlegm (sputum) or you cough up blood.  You are short of breath.   This information is not intended to replace advice given to you by your health care provider. Make sure you discuss any questions you have with your health care provider.   Document Released: 03/19/2008 Document Revised: 06/22/2015 Document Reviewed: 12/27/2014 Elsevier  Interactive Patient Education [employer]
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
## 2015-10-21 NOTE — ED Notes (Signed)
Pt crying, states she had a follow up apt with ID today regarding her new HIV diagnosis, and was told that her counts were "too high." pt crying and sobbing, comforted by this rn. Pt states she feels stressed out, and "my life is in trouble." pt reassured and comforted by this rn, encouraged to take the medications the ID physician prescribed her and to take good care of herself. Pt reports that while she was crying her chest started hurting on the right side, increases with inspiration, tender to palp, increases with movement of right arm and certain positions. Pt states 'it feels sharp, stabbing.Marland Kitchen.Marland Kitchen."

## 2015-10-21 NOTE — ED Notes (Addendum)
CP x 20 min-pt is crying and texting in triage-also talking w/o distress to adult female in triage

## 2015-10-22 LAB — RPR

## 2015-10-24 LAB — URINE CYTOLOGY ANCILLARY ONLY
CHLAMYDIA, DNA PROBE: NEGATIVE
NEISSERIA GONORRHEA: NEGATIVE

## 2015-10-25 LAB — HIV-1 RNA ULTRAQUANT REFLEX TO GENTYP+
HIV 1 RNA Quant: 38200 copies/mL — ABNORMAL HIGH (ref <20)
HIV-1 RNA Quant, Log: 4.58 Log copies/mL — ABNORMAL HIGH (ref <1.30)

## 2015-11-02 ENCOUNTER — Encounter (HOSPITAL_BASED_OUTPATIENT_CLINIC_OR_DEPARTMENT_OTHER): Payer: Self-pay

## 2015-11-02 ENCOUNTER — Emergency Department (HOSPITAL_BASED_OUTPATIENT_CLINIC_OR_DEPARTMENT_OTHER)
Admission: EM | Admit: 2015-11-02 | Discharge: 2015-11-02 | Disposition: A | Discharge status: Home / Self Care | Payer: Self-pay | Attending: Emergency Medicine | Admitting: Emergency Medicine

## 2015-11-02 ENCOUNTER — Emergency Department (HOSPITAL_BASED_OUTPATIENT_CLINIC_OR_DEPARTMENT_OTHER): Payer: Self-pay

## 2015-11-02 DIAGNOSIS — Z7952 Long term (current) use of systemic steroids: Secondary | ICD-10-CM | POA: Insufficient documentation

## 2015-11-02 DIAGNOSIS — M549 Dorsalgia, unspecified: Secondary | ICD-10-CM | POA: Insufficient documentation

## 2015-11-02 DIAGNOSIS — Z3202 Encounter for pregnancy test, result negative: Secondary | ICD-10-CM | POA: Insufficient documentation

## 2015-11-02 DIAGNOSIS — Z79899 Other long term (current) drug therapy: Secondary | ICD-10-CM | POA: Insufficient documentation

## 2015-11-02 DIAGNOSIS — B20 Human immunodeficiency virus [HIV] disease: Secondary | ICD-10-CM | POA: Insufficient documentation

## 2015-11-02 DIAGNOSIS — J069 Acute upper respiratory infection, unspecified: Secondary | ICD-10-CM | POA: Insufficient documentation

## 2015-11-02 DIAGNOSIS — R079 Chest pain, unspecified: Secondary | ICD-10-CM | POA: Insufficient documentation

## 2015-11-02 DIAGNOSIS — E871 Hypo-osmolality and hyponatremia: Secondary | ICD-10-CM | POA: Insufficient documentation

## 2015-11-02 DIAGNOSIS — I10 Essential (primary) hypertension: Secondary | ICD-10-CM | POA: Insufficient documentation

## 2015-11-02 DIAGNOSIS — Z880 Allergy status to penicillin: Secondary | ICD-10-CM | POA: Insufficient documentation

## 2015-11-02 DIAGNOSIS — E119 Type 2 diabetes mellitus without complications: Secondary | ICD-10-CM | POA: Insufficient documentation

## 2015-11-02 DIAGNOSIS — Z87828 Personal history of other (healed) physical injury and trauma: Secondary | ICD-10-CM | POA: Insufficient documentation

## 2015-11-02 DIAGNOSIS — R109 Unspecified abdominal pain: Secondary | ICD-10-CM | POA: Insufficient documentation

## 2015-11-02 LAB — BASIC METABOLIC PANEL
Anion gap: 6 (ref 5–15)
BUN: 8 mg/dL (ref 6–20)
CHLORIDE: 102 mmol/L (ref 101–111)
CO2: 24 mmol/L (ref 22–32)
Calcium: 8.5 mg/dL — ABNORMAL LOW (ref 8.9–10.3)
Creatinine, Ser: 0.6 mg/dL (ref 0.44–1.00)
GFR calc Af Amer: >60 mL/min (ref >60)
GLUCOSE: 89 mg/dL (ref 65–99)
POTASSIUM: 3.7 mmol/L (ref 3.5–5.1)
Sodium: 132 mmol/L — ABNORMAL LOW (ref 135–145)

## 2015-11-02 LAB — URINALYSIS, ROUTINE W REFLEX MICROSCOPIC
BILIRUBIN URINE: NEGATIVE
Glucose, UA: NEGATIVE mg/dL
HGB URINE DIPSTICK: NEGATIVE
Ketones, ur: NEGATIVE mg/dL
LEUKOCYTES UA: NEGATIVE
Nitrite: NEGATIVE
PH: 6 (ref 5.0–8.0)
Protein, ur: NEGATIVE mg/dL
SPECIFIC GRAVITY, URINE: 1.01 (ref 1.005–1.030)

## 2015-11-02 LAB — CBC WITH DIFFERENTIAL/PLATELET
Basophils Absolute: 0 10*3/uL (ref 0.0–0.1)
Basophils Relative: 0 %
EOS PCT: 0 %
Eosinophils Absolute: 0 10*3/uL (ref 0.0–0.7)
HCT: 35 % — ABNORMAL LOW (ref 36.0–46.0)
Hemoglobin: 11.6 g/dL — ABNORMAL LOW (ref 12.0–15.0)
LYMPHS ABS: 0.9 10*3/uL (ref 0.7–4.0)
LYMPHS PCT: 26 %
MCH: 28.2 pg (ref 26.0–34.0)
MCHC: 33.1 g/dL (ref 30.0–36.0)
MCV: 85.2 fL (ref 78.0–100.0)
MONOS PCT: 9 %
Monocytes Absolute: 0.3 10*3/uL (ref 0.1–1.0)
Neutro Abs: 2.3 10*3/uL (ref 1.7–7.7)
Neutrophils Relative %: 65 %
PLATELETS: 192 10*3/uL (ref 150–400)
RBC: 4.11 MIL/uL (ref 3.87–5.11)
RDW: 15 % (ref 11.5–15.5)
WBC: 3.5 10*3/uL — ABNORMAL LOW (ref 4.0–10.5)

## 2015-11-02 LAB — PREGNANCY, URINE: PREG TEST UR: NEGATIVE

## 2015-11-02 MED ORDER — ACETAMINOPHEN 500 MG PO TABS
1000.0000 mg | ORAL_TABLET | Freq: Once | ORAL | Status: AC
Start: 2015-11-02 — End: 2015-11-02
  Administered 2015-11-02: 1000 mg via ORAL
  Filled 2015-11-02: qty 2

## 2015-11-02 MED ORDER — SODIUM CHLORIDE 0.9 % IV BOLUS (SEPSIS)
1000.0000 mL | Freq: Once | INTRAVENOUS | Status: AC
  Administered 2015-11-02: 1000 mL via INTRAVENOUS

## 2015-11-02 MED ORDER — AZITHROMYCIN 250 MG PO TABS: 250.0000 mg | ORAL_TABLET | Freq: Every day | ORAL | Status: AC

## 2015-11-02 MED ORDER — ACETAMINOPHEN 325 MG PO TABS: 625.0000 mg | ORAL_TABLET | Freq: Four times a day (QID) | ORAL | Status: AC | PRN
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
## 2015-11-02 NOTE — Discharge Instructions (Signed)
You were seen in the emergency room today for evaluation of cough and body aches. Your labs were unremarkable. Your chest x-ray did not show signs of pneumonia. However, I will give you a prescription for antibiotics to take for the next few days to cover for developing pneumonia given how long you have been sick and poorly you feel. I will also give you a prescription for tylenol to take as needed for your body aches. Please call Dr. Ninetta Lights to schedule an appointment for follo-up this week. Return to the ER immediately for new or worsening symptoms.

## 2015-11-02 NOTE — ED Notes (Signed)
C/o prod cough, body aches x 1 week-lat dose tylenol 1 hour PTA-NAD

## 2015-11-03 LAB — HIV-1 GENOTYPR PLUS

## 2015-11-09 ENCOUNTER — Ambulatory Visit: Payer: Self-pay | Admitting: Infectious Diseases

## 2016-01-31 IMAGING — CR DG CHEST 2V
2 series · 2 of 2 positions shown · non-contrast
Comparison: 10/21/2015

CLINICAL DATA: Cough, body aches x1wk.

EXAM:
CHEST  2 VIEW

[w chest pa]
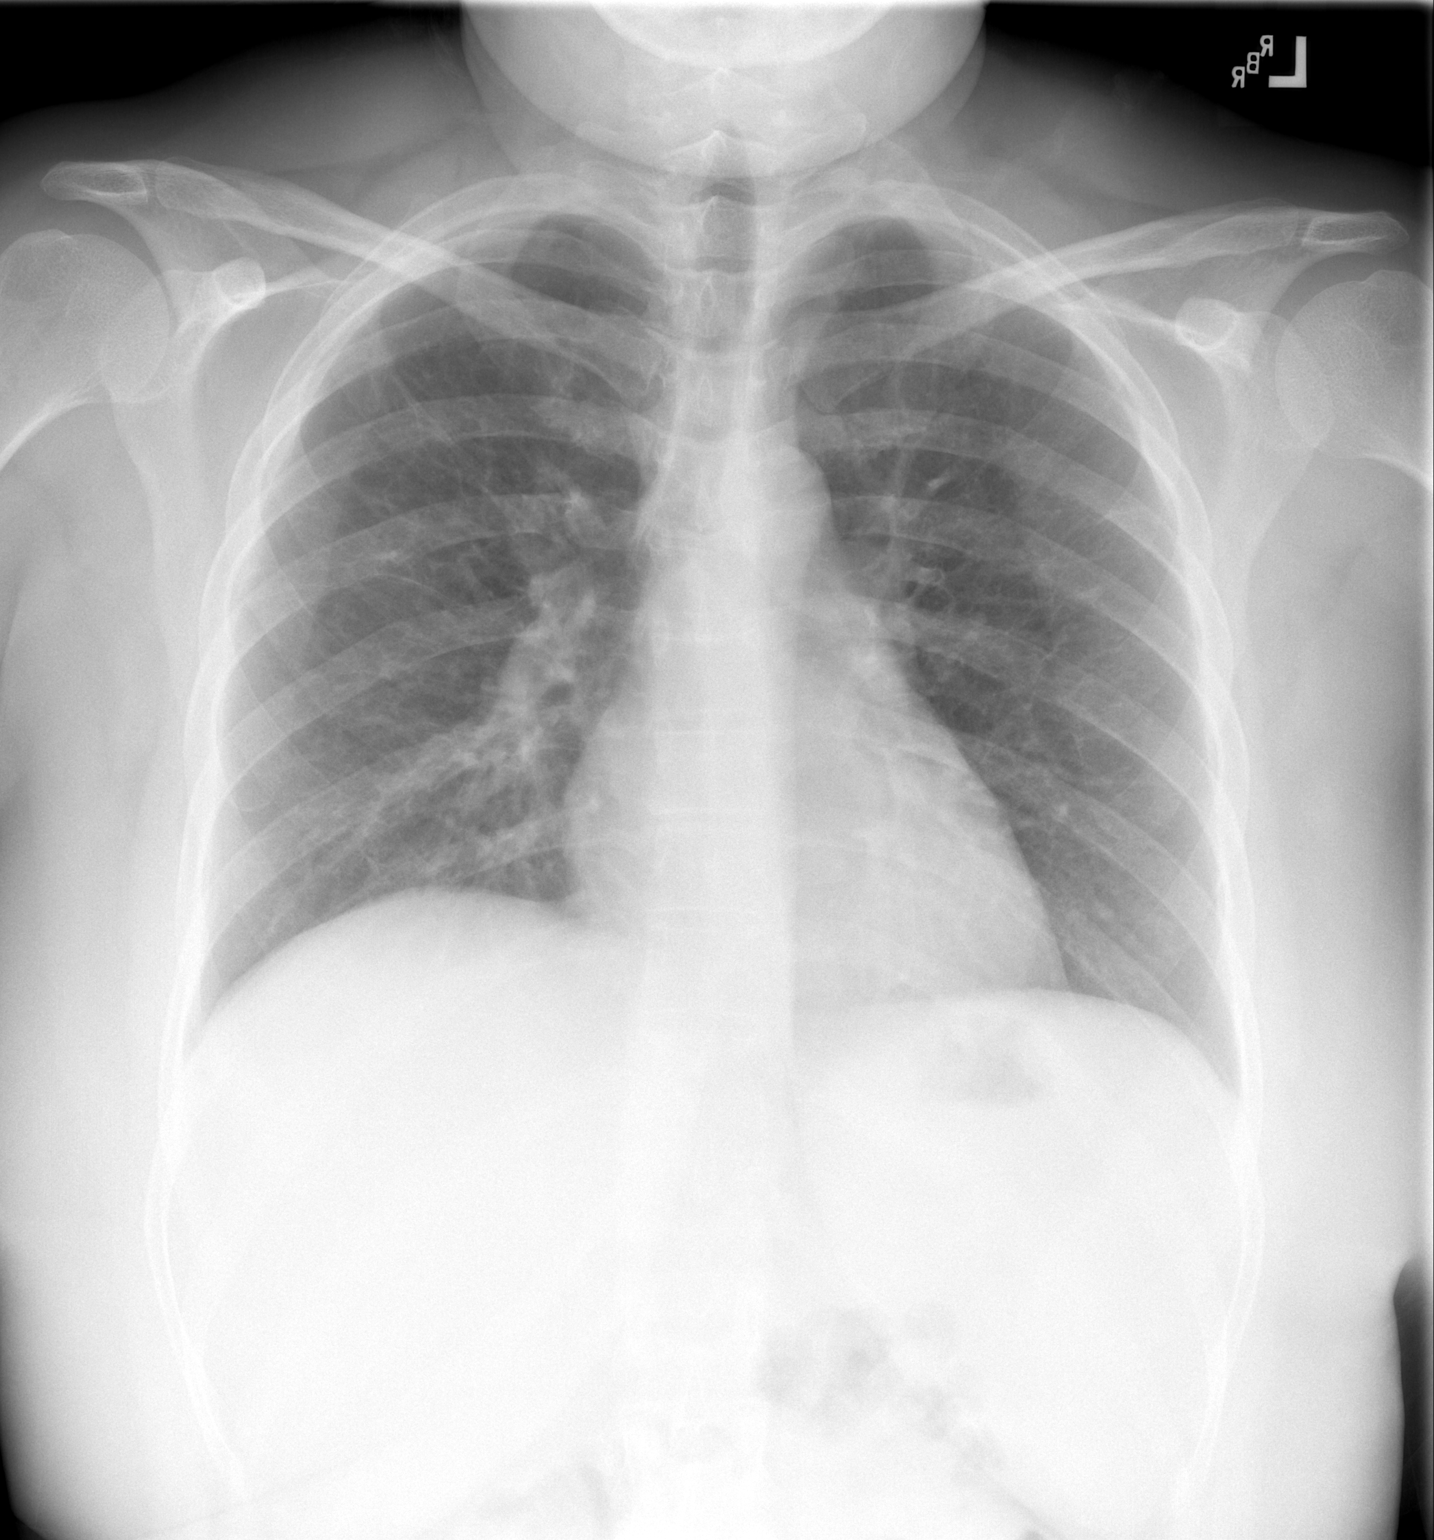

[w chest lat]
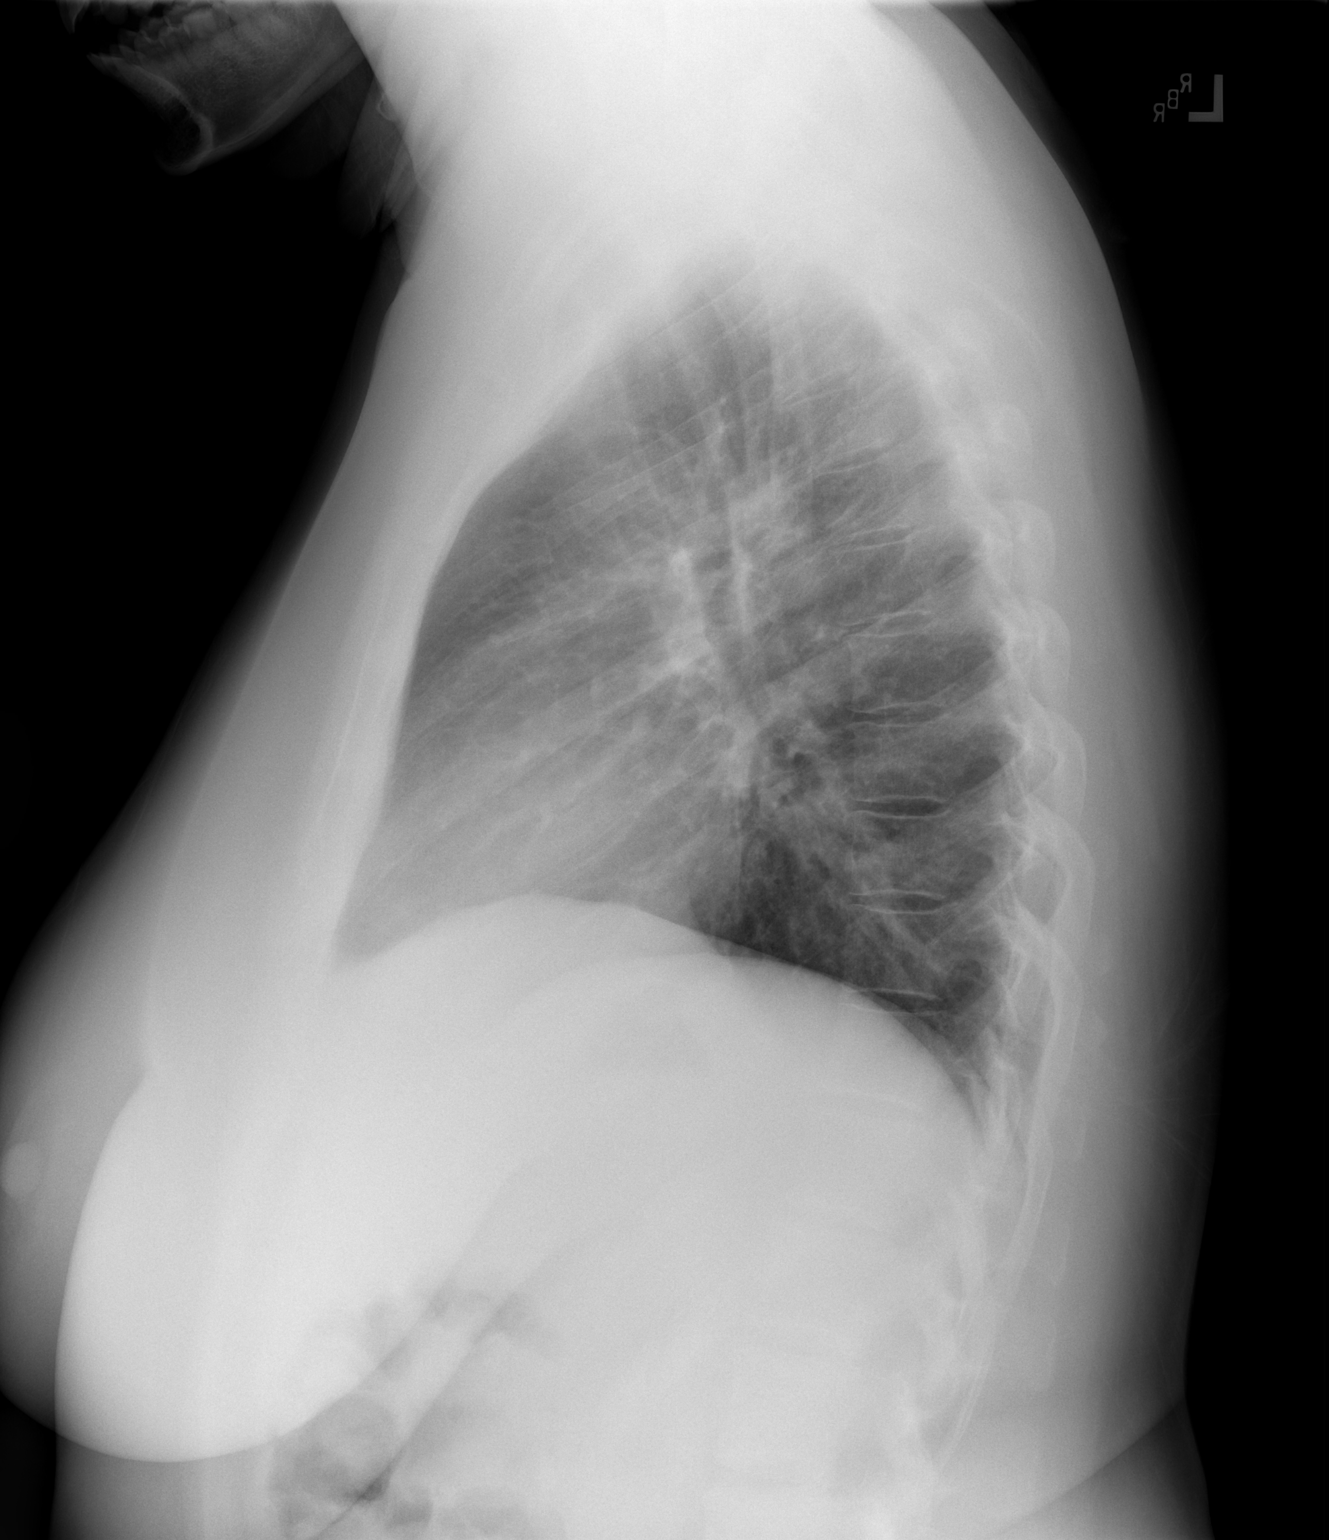

[2 of 2 positions shown; findings below may reference images not displayed]

FINDINGS: Mild right hemidiaphragm elevation. Midline trachea. Normal heart
size and mediastinal contours. No pleural effusion or pneumothorax.
Clear lungs.
IMPRESSION: No acute cardiopulmonary disease.

## 2016-02-15 ENCOUNTER — Telehealth: Payer: Self-pay

## 2016-02-15 NOTE — Telephone Encounter (Signed)
Alison Chambers , [place] Counselor contacted patient and she is now ready to return to care.  We will call her and schedule office visit with Dr Ninetta LightsHatcher.  Alison Josephsammy K Jencarlo Bonadonna, RN

## 2016-03-24 ENCOUNTER — Encounter (HOSPITAL_COMMUNITY): Payer: Self-pay

## 2016-03-24 ENCOUNTER — Inpatient Hospital Stay (HOSPITAL_COMMUNITY)
Admission: AD | Admit: 2016-03-24 | Discharge: 2016-03-24 | Disposition: A | Discharge status: Home / Self Care | Payer: Medicaid Other | Source: Ambulatory Visit | Attending: Obstetrics & Gynecology | Admitting: Obstetrics & Gynecology

## 2016-03-24 DIAGNOSIS — I10 Essential (primary) hypertension: Secondary | ICD-10-CM | POA: Diagnosis not present

## 2016-03-24 DIAGNOSIS — R109 Unspecified abdominal pain: Secondary | ICD-10-CM | POA: Diagnosis present

## 2016-03-24 DIAGNOSIS — R110 Nausea: Secondary | ICD-10-CM | POA: Insufficient documentation

## 2016-03-24 DIAGNOSIS — K2990 Gastroduodenitis, unspecified, without bleeding: Secondary | ICD-10-CM

## 2016-03-24 DIAGNOSIS — K2970 Gastritis, unspecified, without bleeding: Secondary | ICD-10-CM | POA: Insufficient documentation

## 2016-03-24 DIAGNOSIS — E119 Type 2 diabetes mellitus without complications: Secondary | ICD-10-CM | POA: Diagnosis not present

## 2016-03-24 DIAGNOSIS — B20 Human immunodeficiency virus [HIV] disease: Secondary | ICD-10-CM | POA: Insufficient documentation

## 2016-03-24 LAB — CBC
HEMATOCRIT: 33.2 % — AB (ref 36.0–46.0)
Hemoglobin: 11.5 g/dL — ABNORMAL LOW (ref 12.0–15.0)
MCH: 28.8 pg (ref 26.0–34.0)
MCHC: 34.6 g/dL (ref 30.0–36.0)
MCV: 83.2 fL (ref 78.0–100.0)
PLATELETS: 209 10*3/uL (ref 150–400)
RBC: 3.99 MIL/uL (ref 3.87–5.11)
RDW: 14.4 % (ref 11.5–15.5)
WBC: 2.7 10*3/uL — AB (ref 4.0–10.5)

## 2016-03-24 LAB — COMPREHENSIVE METABOLIC PANEL
ALBUMIN: 3.1 g/dL — AB (ref 3.5–5.0)
ALT: 14 U/L (ref 14–54)
AST: 21 U/L (ref 15–41)
Alkaline Phosphatase: 47 U/L (ref 38–126)
Anion gap: 6 (ref 5–15)
BUN: 14 mg/dL (ref 6–20)
CHLORIDE: 102 mmol/L (ref 101–111)
CO2: 25 mmol/L (ref 22–32)
CREATININE: 0.86 mg/dL (ref 0.44–1.00)
Calcium: 8.4 mg/dL — ABNORMAL LOW (ref 8.9–10.3)
GFR calc Af Amer: >60 mL/min (ref >60)
Glucose, Bld: 96 mg/dL (ref 65–99)
POTASSIUM: 3.8 mmol/L (ref 3.5–5.1)
Sodium: 133 mmol/L — ABNORMAL LOW (ref 135–145)
Total Bilirubin: 0.9 mg/dL (ref 0.3–1.2)
Total Protein: 8.6 g/dL — ABNORMAL HIGH (ref 6.5–8.1)

## 2016-03-24 LAB — URINALYSIS, ROUTINE W REFLEX MICROSCOPIC
Bilirubin Urine: NEGATIVE
Glucose, UA: NEGATIVE mg/dL
Hgb urine dipstick: NEGATIVE
Ketones, ur: NEGATIVE mg/dL
Leukocytes, UA: NEGATIVE
Nitrite: NEGATIVE
Protein, ur: NEGATIVE mg/dL
Specific Gravity, Urine: 1.02 (ref 1.005–1.030)
pH: 5.5 (ref 5.0–8.0)

## 2016-03-24 LAB — POCT PREGNANCY, URINE: Preg Test, Ur: NEGATIVE

## 2016-03-24 MED ORDER — ONDANSETRON 4 MG PO TBDP: 4.0000 mg | ORAL_TABLET | Freq: Three times a day (TID) | ORAL | Status: AC | PRN

## 2016-03-24 MED ORDER — ONDANSETRON 8 MG PO TBDP
8.0000 mg | ORAL_TABLET | Freq: Once | ORAL | Status: AC
  Administered 2016-03-24: 8 mg via ORAL
  Filled 2016-03-24: qty 1
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
## 2016-03-24 NOTE — Discharge Instructions (Signed)
Gastritis - Adultos   (Gastritis, Adult)   Gastrittis es la hinchazón e irritación (inflamación) del revestimiento interno del estómago. Si no recibe tratamiento, la gastritis puede causar sangrado y llagas.(úlceras) en el estómago.  CUIDADOS EN EL HOGAR   · Sólo tome los medicamentos según le indique el médico.  · Si le han recetado antibióticos, tómelos según las indicaciones. Termine de tomar el medicamento, aunque comience a sentirse mejor.  · Beba gran cantidad de líquido para mantener el pis (orina) de tono claro o amarillo pálido.  · Evite las comidas y bebidas que empeoran los problemas. Los alimentos que debe evitar son:  ¨ Cafeína y alcohol.  ¨ Chocolate.  ¨ Menta.  ¨ Ajo y cebolla.  ¨ Comidas muy condimentadas.  ¨ Cítricos como naranjas, limones o limas.  ¨ Alimentos que contengan tomate, como salsas, chile y pizza.  ¨ Alimentos fritos y grasos.  · Haga comidas pequeñas durante el día en lugar de 3 comidas abundantes.  SOLICITE AYUDA DE INMEDIATO SI:   · La materia fecal (heces)es negra o de color rojo oscuro.  · Vomita sangre. Puede ser similar a la borra del café  · No puede retener los líquidos.  · El dolor en el vientre (abdominal) empeora.  · Tiene fiebre.  · No mejora luego de 1 semana.  · Tiene preguntas o preocupaciones.  ASEGÚRESE DE QUE:   · Comprende estas instrucciones.  · Controlará su enfermedad.  · Solicitará ayuda de inmediato si no mejora o si empeora.     Esta información no tiene como fin reemplazar el consejo del médico. Asegúrese de hacerle al médico cualquier pregunta que tenga.     Document Released: 04/01/2012  Elsevier Interactive Patient Education ©2016 Elsevier Inc.

## 2016-03-24 NOTE — MAU Note (Signed)
Pt c/o sharp abdominal pain that started tonight after drinking 3 pina coladas. Took 3 tylenol. Denies vag bleeding. Feeling nauseous but has not vomited.

## 2016-03-29 ENCOUNTER — Encounter (HOSPITAL_COMMUNITY): Payer: Self-pay | Admitting: *Deleted

## 2016-03-29 ENCOUNTER — Emergency Department (HOSPITAL_COMMUNITY)
Admission: EM | Admit: 2016-03-29 | Discharge: 2016-03-29 | Discharge status: Against Medical Advice | Payer: Medicaid Other | Attending: Emergency Medicine | Admitting: Emergency Medicine

## 2016-03-29 ENCOUNTER — Emergency Department (HOSPITAL_COMMUNITY): Payer: Medicaid Other

## 2016-03-29 DIAGNOSIS — Z79899 Other long term (current) drug therapy: Secondary | ICD-10-CM | POA: Diagnosis not present

## 2016-03-29 DIAGNOSIS — E119 Type 2 diabetes mellitus without complications: Secondary | ICD-10-CM | POA: Insufficient documentation

## 2016-03-29 DIAGNOSIS — I10 Essential (primary) hypertension: Secondary | ICD-10-CM | POA: Insufficient documentation

## 2016-03-29 DIAGNOSIS — Z792 Long term (current) use of antibiotics: Secondary | ICD-10-CM | POA: Diagnosis not present

## 2016-03-29 DIAGNOSIS — N72 Inflammatory disease of cervix uteri: Secondary | ICD-10-CM | POA: Diagnosis not present

## 2016-03-29 DIAGNOSIS — R52 Pain, unspecified: Secondary | ICD-10-CM

## 2016-03-29 DIAGNOSIS — R1030 Lower abdominal pain, unspecified: Secondary | ICD-10-CM | POA: Diagnosis present

## 2016-03-29 LAB — URINALYSIS, ROUTINE W REFLEX MICROSCOPIC
BILIRUBIN URINE: NEGATIVE
Glucose, UA: NEGATIVE mg/dL
HGB URINE DIPSTICK: NEGATIVE
Ketones, ur: NEGATIVE mg/dL
Leukocytes, UA: NEGATIVE
Nitrite: NEGATIVE
Protein, ur: NEGATIVE mg/dL
SPECIFIC GRAVITY, URINE: 1.012 (ref 1.005–1.030)
pH: 6 (ref 5.0–8.0)

## 2016-03-29 LAB — CBC
HEMATOCRIT: 34.9 % — AB (ref 36.0–46.0)
Hemoglobin: 11.3 g/dL — ABNORMAL LOW (ref 12.0–15.0)
MCH: 27.7 pg (ref 26.0–34.0)
MCHC: 32.4 g/dL (ref 30.0–36.0)
MCV: 85.5 fL (ref 78.0–100.0)
PLATELETS: 213 10*3/uL (ref 150–400)
RBC: 4.08 MIL/uL (ref 3.87–5.11)
RDW: 14.3 % (ref 11.5–15.5)
WBC: 3.2 10*3/uL — ABNORMAL LOW (ref 4.0–10.5)

## 2016-03-29 LAB — WET PREP, GENITAL
Clue Cells Wet Prep HPF POC: NONE SEEN
SPERM: NONE SEEN
Trich, Wet Prep: NONE SEEN
Yeast Wet Prep HPF POC: NONE SEEN

## 2016-03-29 LAB — COMPREHENSIVE METABOLIC PANEL
ALBUMIN: 2.9 g/dL — AB (ref 3.5–5.0)
ALT: 40 U/L (ref 14–54)
AST: 47 U/L — AB (ref 15–41)
Alkaline Phosphatase: 55 U/L (ref 38–126)
Anion gap: 7 (ref 5–15)
BILIRUBIN TOTAL: 0.4 mg/dL (ref 0.3–1.2)
BUN: 9 mg/dL (ref 6–20)
CHLORIDE: 103 mmol/L (ref 101–111)
CO2: 23 mmol/L (ref 22–32)
CREATININE: 0.74 mg/dL (ref 0.44–1.00)
Calcium: 8.5 mg/dL — ABNORMAL LOW (ref 8.9–10.3)
GFR calc Af Amer: >60 mL/min (ref >60)
GFR calc non Af Amer: >60 mL/min (ref >60)
GLUCOSE: 89 mg/dL (ref 65–99)
POTASSIUM: 3.5 mmol/L (ref 3.5–5.1)
Sodium: 133 mmol/L — ABNORMAL LOW (ref 135–145)
Total Protein: 8.6 g/dL — ABNORMAL HIGH (ref 6.5–8.1)

## 2016-03-29 LAB — LIPASE, BLOOD: LIPASE: 30 U/L (ref 11–51)

## 2016-03-29 LAB — PREGNANCY, URINE: PREG TEST UR: NEGATIVE

## 2016-03-29 MED ORDER — AZITHROMYCIN 250 MG PO TABS: 1000.0000 mg | ORAL_TABLET | Freq: Once | ORAL | Status: DC

## 2016-03-29 MED ORDER — MORPHINE SULFATE (PF) 4 MG/ML IV SOLN
4.0000 mg | Freq: Once | INTRAVENOUS | Status: AC
  Administered 2016-03-29: 4 mg via INTRAVENOUS
  Filled 2016-03-29: qty 1

## 2016-03-29 MED ORDER — ONDANSETRON HCL 4 MG/2ML IJ SOLN
4.0000 mg | Freq: Once | INTRAMUSCULAR | Status: AC
  Administered 2016-03-29: 4 mg via INTRAVENOUS
  Filled 2016-03-29: qty 2

## 2016-03-29 MED ORDER — DIPHENHYDRAMINE HCL 50 MG/ML IJ SOLN
25.0000 mg | Freq: Once | INTRAMUSCULAR | Status: AC
  Administered 2016-03-29: 25 mg via INTRAVENOUS
  Filled 2016-03-29: qty 1

## 2016-03-29 MED ORDER — DOXYCYCLINE HYCLATE 100 MG PO CAPS: 100.0000 mg | ORAL_CAPSULE | Freq: Two times a day (BID) | ORAL | Status: AC

## 2016-03-29 MED ORDER — CEFTRIAXONE SODIUM 250 MG IJ SOLR
250.0000 mg | Freq: Once | INTRAMUSCULAR | Status: DC
Start: 2016-03-29 — End: 2016-03-29

## 2016-03-29 MED ORDER — OXYCODONE-ACETAMINOPHEN 5-325 MG PO TABS: 2.0000 | ORAL_TABLET | Freq: Four times a day (QID) | ORAL | Status: AC | PRN

## 2016-03-29 NOTE — ED Notes (Signed)
Pt declines discharge treatment or vital signs.  Ambulatory to waiting room in NAD.

## 2016-03-29 NOTE — Discharge Instructions (Signed)
Cervicitis °(Cervicitis) °Es el dolor e hinchazón (inflamación) del cuello uterino. El cuello del útero se encuentra en la parte inferior del útero. Se abre hacia la vagina. °CAUSAS  °· Enfermedades de transmisión sexual (ETS).   °· Reacciones alérgicas.   °· Medicamentos o dispositivos anticonceptivos que se colocan en la vagina.   °· Traumatismo en el cuello del útero.   °· Infecciones bacterianas.   °FACTORES DE RIESGO °Usted tendrá mayor riesgo de sufrir esta enfermedad si: °· Tiene relaciones sexuales sin protección. °· Ha tenido relaciones sexuales con varias parejas. °· Comienza a tener relaciones sexuales a una edad temprana. °· Tiene una historia de ETS. °SÍNTOMAS  °Puede ser que no haya síntomas. Si hay síntomas, pueden ser:  °· Secreción vaginal de color gris, blanco, amarillo o que tiene mal olor.   °· Picazón o dolor en la zona externa de la vagina.   °· Relaciones sexuales dolorosas.   °· Dolor en la zona baja del abdomen o la espalda, especialmente durante las relaciones sexuales.   °· Ganas de orinar con frecuencia.   °· Sangrado vaginal anormal entre períodos, después de las relaciones sexuales o después de la menopausia.   °· Sensación de presión o pesadez en la pelvis.   °DIAGNÓSTICO  °El diagnóstico se realiza mediante un examen pélvico. Otras pruebas son:  °· Examen de las secreciones en el microscopio (preparado fresco).   °· Papanicolau   °TRATAMIENTO  °El tratamiento dependerá de la causa de la cervicitis. Si la causa es una enfermedad de transmisión sexual, usted y su pareja necesitarán realizar un tratamiento. Le prescribirán antibióticos.  °INSTRUCCIONES PARA EL CUIDADO EN EL HOGAR  °· No tenga relaciones sexuales hasta que el médico la autorice.   °· No tenga relaciones sexuales hasta que su compañero haya sido tratado si la causa de la cervicitis es una enfermedad de transmisión sexual.   °· Tome los antibióticos como se le indicó. Tómelos todos, aunque se sienta mejor.   °SOLICITE  ATENCIÓN MÉDICA SI: °· Los síntomas vuelven a aparecer.   °· Tiene fiebre.   °ASEGÚRESE DE QUE:  °· Comprende estas instrucciones. °· Controlará su afección. °· Recibirá ayuda de inmediato si no mejora o si empeora. °  °Esta información no tiene como fin reemplazar el consejo del médico. Asegúrese de hacerle al médico cualquier pregunta que tenga. °  °Document Released: 10/01/2005 Document Revised: 06/03/2013 °Elsevier Interactive Patient Education ©2016 Elsevier Inc. ° °

## 2016-03-29 NOTE — ED Notes (Signed)
The pt is c/o abd and back pain for 5 days with n v lmp last month
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
## 2016-03-29 NOTE — ED Notes (Signed)
Patient reports feeling "itchy all over", denies known morphine allergy, no hives visible, airway intact, VSS. Rob PA aware.

## 2016-03-29 NOTE — ED Notes (Signed)
Upon learning of negative pregnancy results, patient and significant other became upset with each other and engaged in a verbal argument.  Interpreter utilized for delivery of results and for answering any questions.  Patient requests to leave prior to proposed treatment for infection. Rob PA discussed proposed treatment and risks of leaving with patient.  Patient still wants to leave AMA.
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
## 2016-03-30 ENCOUNTER — Encounter (HOSPITAL_BASED_OUTPATIENT_CLINIC_OR_DEPARTMENT_OTHER): Payer: Self-pay | Admitting: Emergency Medicine

## 2016-03-30 ENCOUNTER — Emergency Department (HOSPITAL_BASED_OUTPATIENT_CLINIC_OR_DEPARTMENT_OTHER)
Admission: EM | Admit: 2016-03-30 | Discharge: 2016-03-30 | Disposition: A | Discharge status: Home / Self Care | Payer: Medicaid Other | Attending: Emergency Medicine | Admitting: Emergency Medicine

## 2016-03-30 ENCOUNTER — Emergency Department (HOSPITAL_BASED_OUTPATIENT_CLINIC_OR_DEPARTMENT_OTHER): Payer: Medicaid Other

## 2016-03-30 DIAGNOSIS — R1084 Generalized abdominal pain: Secondary | ICD-10-CM | POA: Diagnosis present

## 2016-03-30 DIAGNOSIS — I10 Essential (primary) hypertension: Secondary | ICD-10-CM | POA: Insufficient documentation

## 2016-03-30 DIAGNOSIS — Z79899 Other long term (current) drug therapy: Secondary | ICD-10-CM | POA: Insufficient documentation

## 2016-03-30 DIAGNOSIS — R0602 Shortness of breath: Secondary | ICD-10-CM | POA: Insufficient documentation

## 2016-03-30 DIAGNOSIS — R197 Diarrhea, unspecified: Secondary | ICD-10-CM | POA: Insufficient documentation

## 2016-03-30 DIAGNOSIS — E119 Type 2 diabetes mellitus without complications: Secondary | ICD-10-CM | POA: Insufficient documentation

## 2016-03-30 LAB — CBC WITH DIFFERENTIAL/PLATELET
BASOS ABS: 0 10*3/uL (ref 0.0–0.1)
Basophils Relative: 0 %
EOS PCT: 1 %
Eosinophils Absolute: 0 10*3/uL (ref 0.0–0.7)
HEMATOCRIT: 35.4 % — AB (ref 36.0–46.0)
Hemoglobin: 11.9 g/dL — ABNORMAL LOW (ref 12.0–15.0)
LYMPHS PCT: 19 %
Lymphs Abs: 0.6 10*3/uL — ABNORMAL LOW (ref 0.7–4.0)
MCH: 29 pg (ref 26.0–34.0)
MCHC: 33.6 g/dL (ref 30.0–36.0)
MCV: 86.1 fL (ref 78.0–100.0)
Monocytes Absolute: 0.3 10*3/uL (ref 0.1–1.0)
Monocytes Relative: 9 %
NEUTROS ABS: 2.2 10*3/uL (ref 1.7–7.7)
NEUTROS PCT: 71 %
PLATELETS: 218 10*3/uL (ref 150–400)
RBC: 4.11 MIL/uL (ref 3.87–5.11)
RDW: 14.5 % (ref 11.5–15.5)
WBC: 3.1 10*3/uL — AB (ref 4.0–10.5)

## 2016-03-30 LAB — URINALYSIS, ROUTINE W REFLEX MICROSCOPIC
Bilirubin Urine: NEGATIVE
Glucose, UA: NEGATIVE mg/dL
Hgb urine dipstick: NEGATIVE
Ketones, ur: NEGATIVE mg/dL
LEUKOCYTES UA: NEGATIVE
NITRITE: NEGATIVE
PROTEIN: NEGATIVE mg/dL
Specific Gravity, Urine: 1.024 (ref 1.005–1.030)
pH: 6 (ref 5.0–8.0)

## 2016-03-30 LAB — GC/CHLAMYDIA PROBE AMP (~~LOC~~) NOT AT ARMC
Chlamydia: NEGATIVE
NEISSERIA GONORRHEA: NEGATIVE

## 2016-03-30 MED ORDER — HYDROMORPHONE HCL 1 MG/ML IJ SOLN
0.5000 mg | Freq: Once | INTRAMUSCULAR | Status: AC
  Administered 2016-03-30: 0.5 mg via INTRAVENOUS
  Filled 2016-03-30: qty 1

## 2016-03-30 MED ORDER — SODIUM CHLORIDE 0.9 % IV SOLN
Freq: Once | INTRAVENOUS | Status: AC
  Administered 2016-03-30: 11:00:00 via INTRAVENOUS

## 2016-03-30 MED ORDER — DICYCLOMINE HCL 20 MG PO TABS: 20.0000 mg | ORAL_TABLET | Freq: Two times a day (BID) | ORAL | Status: AC
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
## 2016-03-30 NOTE — Discharge Instructions (Signed)

## 2016-03-30 NOTE — ED Notes (Addendum)
Pt c/o bilat lower abd pain, states it feels like "getting a shot" and also rectal bleeding all started last night. Pt states this is different than what she was seen for yesterday. Pt states she did not get the abx filled from yesterdays visit. Pt denies N/V. Pt states she took tylenol this morning without relief.

## 2016-03-30 NOTE — ED Notes (Signed)
PA at bedside.

## 2018-07-10 ENCOUNTER — Telehealth: Payer: Self-pay

## 2018-07-10 NOTE — Telephone Encounter (Signed)
Patient is one the overdue PAP smear list. Attempted to call patient to see if she has had a pap recently if not. she would be able to have one done in our office. Patient has not been seen in the office since 2014 and needs to return in care if not being monitored at another office. Left a voicemail asking patient to call us back when available. Lorenso Courier, [place]
